# Patient Record
Sex: Male | Born: 1945 | Race: White | Hispanic: No | Marital: Married | Smoking: Never smoker
Health system: Southern US, Community
[De-identification: ages and names within clinical notes are randomized; demographics above are authoritative.]

## PROBLEM LIST (undated history)

## (undated) DIAGNOSIS — H9192 Unspecified hearing loss, left ear: Secondary | ICD-10-CM

## (undated) DIAGNOSIS — I50812 Chronic right heart failure: Secondary | ICD-10-CM

## (undated) DIAGNOSIS — I341 Nonrheumatic mitral (valve) prolapse: Secondary | ICD-10-CM

## (undated) DIAGNOSIS — C61 Malignant neoplasm of prostate: Secondary | ICD-10-CM

## (undated) DIAGNOSIS — E039 Hypothyroidism, unspecified: Secondary | ICD-10-CM

## (undated) DIAGNOSIS — R7302 Impaired glucose tolerance (oral): Secondary | ICD-10-CM

## (undated) DIAGNOSIS — I351 Nonrheumatic aortic (valve) insufficiency: Secondary | ICD-10-CM

## (undated) DIAGNOSIS — E785 Hyperlipidemia, unspecified: Secondary | ICD-10-CM

## (undated) DIAGNOSIS — G4730 Sleep apnea, unspecified: Secondary | ICD-10-CM

## (undated) DIAGNOSIS — C439 Malignant melanoma of skin, unspecified: Secondary | ICD-10-CM

## (undated) HISTORY — PX: CATARACT EXTRACTION: SUR2

## (undated) HISTORY — PX: COLONOSCOPY: SHX174

## (undated) HISTORY — PX: TONSILLECTOMY: SUR1361

## (undated) HISTORY — PX: MELANOMA EXCISION: SHX5266

## (undated) HISTORY — PX: RETINAL DETACHMENT SURGERY: SHX105

## (undated) HISTORY — DX: Unspecified hearing loss, left ear: H91.92

## (undated) HISTORY — PX: SPINE SURGERY: SHX786

## (undated) HISTORY — DX: Impaired glucose tolerance (oral): R73.02

## (undated) HISTORY — DX: Chronic right heart failure: I50.812

## (undated) HISTORY — DX: Nonrheumatic aortic (valve) insufficiency: I35.1

---

## 1968-07-13 HISTORY — PX: MENISCUS REPAIR: SHX5179

## 1997-10-26 ENCOUNTER — Emergency Department (HOSPITAL_COMMUNITY): Admission: EM | Admit: 1997-10-26 | Discharge: 1997-10-26 | Payer: Self-pay | Admitting: Emergency Medicine

## 1999-10-08 ENCOUNTER — Encounter: Admission: RE | Admit: 1999-10-08 | Discharge: 1999-10-08 | Payer: Self-pay | Admitting: *Deleted

## 1999-10-08 ENCOUNTER — Encounter: Payer: Self-pay | Admitting: *Deleted

## 2003-08-16 ENCOUNTER — Ambulatory Visit (HOSPITAL_COMMUNITY): Admission: RE | Admit: 2003-08-16 | Discharge: 2003-08-16 | Payer: Self-pay | Admitting: Gastroenterology

## 2007-03-30 ENCOUNTER — Encounter (INDEPENDENT_AMBULATORY_CARE_PROVIDER_SITE_OTHER): Payer: Self-pay | Admitting: Otolaryngology

## 2007-03-30 ENCOUNTER — Ambulatory Visit (HOSPITAL_COMMUNITY): Admission: RE | Admit: 2007-03-30 | Discharge: 2007-03-31 | Payer: Self-pay | Admitting: Otolaryngology

## 2010-11-25 NOTE — H&P (Signed)
Matthew Robinson, Matthew Robinson                ACCOUNT NO.:  [id number]   MEDICAL RECORD NO.:  [id number]          PATIENT TYPE:  AMB   LOCATION:  SDS                          FACILITY:  [facility]   PHYSICIAN:  Hermelinda Medicus, M.D.   DATE OF BIRTH:  Oct 28, 1945   DATE OF ADMISSION:  03/30/2007  DATE OF DISCHARGE:                              HISTORY & PHYSICAL   HISTORY OF PRESENT ILLNESS:  This patient is a 65 year old male who has  had considerable nasal congestion and snoring issues.  He has  considerable septal deviation, turbinate hypertrophy, and also has a  very redundant palate and very large uvula, typical of a snorer.  He now  enters for a septal reconstruction, turbinate reduction, as well as a  laser-assisted uvulopalatoplasty under local MAC anesthesia and will be  kept on 23-hour observation.   PAST MEDICAL HISTORY:  His past history is one of dyslipidemia.  He is  on Zocor.  He also has a chest x-ray with mild COPD changes.  He has  never been a smoker.  He has had some orthopedic issues under Dr.  Isaias Cowman.  His medications are the aspirin 81, which he just takes 3  times a week, multivitamins, and Zocor 20 mg q.o.d.  He does have some  mild allergic rhinitis along with this and sinus congestion, but  primarily this is a septal deviation.  He has early cataracts, mitral  prolapse, diverticulosis, a hemorrhoid, osteoarthritis left knee, lumbar  spine scoliosis.  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   FAMILY HISTORY:  Mother 66, still alive.  History of chronic atrial  fibrillation and breast cancer in the mom.  Father had a MI in 70s.  Also had non-Hodgkin's lymphoma.   SOCIAL HISTORY:  He does not smoke.  He does not drink.  He does  exercise, drinks some coffee.   PHYSICAL EXAMINATION:  GENERAL:  He is a well-nourished, well-developed  male.  VITAL SIGNS:  He weighs 270 and is 6'6 tall.  Blood pressure 135/65, at  this time it was 135/80.  When he was brought into the hospital, he  states he was somewhat anxious.  HEENT:  Ears are clear.  Tympanic membranes are clear and move well.  External ear canals are clear.  The nose shows a septal deviation along  the floor of the nose off the premaxillary crest on the left and also  the ethmoid and vomerine septal deviation going off again to the left.  The turbinates are really quite enlarged and blockage of his nose on the  right and the left.  His oral cavity is moderate in size.  He has a  fairly high resting tongue.  The uvula is 2-3 times larger than usual  and the palate is redundant and lower than usual.  The larynx is clear.  True cords, false cords, epiglottis, base of tongue are clear of  ulceration or mass.  True cord by bloody gag reflex, tunnels by the EOMs  and facial nerve are  all symmetrical.  NECK:  Free of any thyromegaly, cervical adenopathy, or mass, but it is  fairly enlarged and full.  CHEST:  Clear.  No rales, rhonchi or wheezes.  CARDIOVASCULAR:  Normal S1.  No murmurs, rubs or gallops.  History of  mitral valve prolapse.  EKG done here showed sinus bradycardia,  otherwise within normal limits.  EXTREMITIES:  Unremarkable.  Left knee history of surgery.   INITIAL DIAGNOSES:  1. 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  2. History of dyslipidemia.  3. History of mild allergies.  4. Early cataract.  5. Mitral valve prolapse.  6. Diverticulosis.  7. Osteoarthritis.  8. History of retinal repair left eye and post knee surgery for      cartilage damage left.           ______________________________  Hermelinda Medicus, M.D.     JC/MEDQ  D:  03/30/2007  T:  03/31/2007  Job:  [id number]   cc:   Georgann Housekeeper, MD

## 2010-11-25 NOTE — Op Note (Signed)
Matthew Robinson, Matthew Robinson                ACCOUNT NO.:  [id number]   MEDICAL RECORD NO.:  [id number]          PATIENT TYPE:  OIB   LOCATION:  2550                         FACILITY:  [facility]   PHYSICIAN:  Hermelinda Medicus, M.D.   DATE OF BIRTH:  1946/03/24   DATE OF PROCEDURE:  DATE OF DISCHARGE:                               OPERATIVE REPORT   PREOPERATIVE DIAGNOSIS:  Septal deviation turbinate hypertrophy with  nasal obstruction, with snoring, with redundant palate and an increased  size uvula.   POSTOPERATIVE DIAGNOSIS:  Septal deviation turbinate hypertrophy with  nasal obstruction, with snoring, with redundant palate and an increased  size uvula.   OPERATION:  Septal reconstruction, turbinate reduction and a laser  assisted uvulopalatoplasty.   OPERATOR:  Hermelinda Medicus, M.D.   ANESTHESIA:  Local monitored anesthesia care with Dr. Katrinka Blazing.   PROCEDURE:  The patient was placed in the supine position.  Under local  MAC anesthesia, was anesthetized using 1% Xylocaine with epinephrine and  topical cocaine 200 mg.  Once the nose was anesthetized, the  hemitransfixion incision was made in the left side, carried back along  the floor of the nose along a very large premaxillary crest area and  also along the ethmoid and vomerine region, which was also deviated  considerably to the left.  A strip of quadrilateral cartilage was taken  down in the posterior aspect and then the strip of cartilage that was  taken from the floor of the nose was grossly into the left side of the  nose.  A 4-mm chisel was also used to take down this portion of septum  that was over off the premaxillary space.  The ethmoid septum was then  approached using the open and closed Jansen-Middletons as well as the  vomerine and then we were able to push some of the septum back to the  midline using the butter knife.  Once this was established in the  midline, then closure begun using 5-0 plane catgut and through and  through  septal suture using 4-0 plain x2 as a hemostatic and as a  blanket stitch.  Attention was then carried to the inferior turbinates,  especially where we were able to outfracture them aggressively and gain  considerable space.  We also used the Elmed bipolar cautery set at 12 to  cauterize the inferolateral aspects of the inferior turbinates.  Attention then was carried to the oral cavity where the eyes and face  were covered with glasses and wet towels.  Oxygen was turned off and the  laser was used after anesthetizing with Cetacaine spray and 1% Xylocaine  with epinephrine.  The laser was used set at 10 watts for 3 minutes.  This was done.  The laser cuts were placed just lateral and superior to  the uvula, and the uvula was approximately 2 to 3 times larger than its  normal size and, therefore, was trimmed also using the laser.  Once this  was completed, the hemostasis was no problem.  Left Merocel pack was  placed and an anesthesia trumpet 7.5 was placed in the right  side.  Patient was relieved of his medication and was taken to the recovery  room and doing very well.   Followup will be in overnight pulse oximetry observation and then in 5  days, 10 day, 3 weeks, 6 weeks, 3 months and 6 months.  He is aware of  the risks and gains, that he is not to be eating aggressively for the  next 10 days, to  avoid highly flavored foods like salad dressings, vinegar, anything  containing vinegar or acidic like grapefruit juice, orange juice,  anything hard to chew, hamburgers, this type of food.  He is also to be  limiting his travels and his physical exercise for the next 10 days.           ______________________________  Hermelinda Medicus, M.D.     JC/MEDQ  D:  03/30/2007  T:  03/30/2007  Job:  [id number]   cc:   Georgann Housekeeper, MD

## 2010-11-28 NOTE — Op Note (Signed)
NAMEJONTE, [name]                            ACCOUNT NO.:  [id number]   MEDICAL RECORD NO.:  [id number]                   PATIENT TYPE:  AMB   LOCATION:  ENDO                                 FACILITY:  [facility]   PHYSICIAN:  Danise Edge, M.D.                DATE OF BIRTH:  1946/03/02   DATE OF PROCEDURE:  08/16/2003  DATE OF DISCHARGE:                                 OPERATIVE REPORT   PROCEDURE:  Screening colonoscopy.   INDICATIONS FOR PROCEDURE:  [name] is a 64 year old male born  01/26/1946.  [name] is scheduled to undergo his first screening  colonoscopy with polypectomy to prevent colon cancer.   ENDOSCOPIST:  Danise Edge, M.D.   PREMEDICATION:  Versed 10 mg, Demerol 70 mg.   DESCRIPTION OF PROCEDURE:  After obtaining informed consent, [name] was  placed in the left lateral decubitus position. I administered intravenous  Demerol and intravenous Versed to achieve conscious sedation for the  procedure. The patient's blood pressure, oxygen saturation and cardiac  rhythm were monitored throughout the procedure and documented in the medical  record.   Anal inspection was normal. Digital rectal exam revealed an enlarged  prostate. The Olympus adjustable pediatric colonoscope was introduced into  the rectum and advanced to the cecum. Colonic preparation for the exam today  was excellent.   [name] has scattered small diverticula throughout the colon without  signs of diverticulitis.   RECTUM:  Normal.   SIGMOID COLON AND DESCENDING COLON:  Normal.   SPLENIC FLEXURE:  Normal.   TRANSVERSE COLON:  Normal.   HEPATIC FLEXURE:  Normal.   ASCENDING COLON:  Normal.   CECUM AND ILEOCECAL VALVE:  Normal.   ASSESSMENT:  Normal screening proctocolonoscopy to the cecum.  No endoscopic  evidence for the presence of colorectal neoplasia.                                               Danise Edge, M.D.    MJ/MEDQ  D:  08/16/2003  T:   08/16/2003  Job:  [id number]   cc:   Georgann Housekeeper, M.D.  [address]  [place]  [place] [address]  Fax: [phone]

## 2011-04-23 LAB — BASIC METABOLIC PANEL
BUN: 16
CO2: 25
Calcium: 9.9
Chloride: 105
Creatinine, Ser: 1.06
GFR calc Af Amer: >60
GFR calc non Af Amer: >60
Glucose, Bld: 91
Potassium: 4
Sodium: 138

## 2011-04-23 LAB — URINALYSIS, ROUTINE W REFLEX MICROSCOPIC
Bilirubin Urine: NEGATIVE
Glucose, UA: NEGATIVE
Hgb urine dipstick: NEGATIVE
Ketones, ur: 15 — AB
Nitrite: NEGATIVE
Protein, ur: NEGATIVE
Specific Gravity, Urine: 1.025
Urobilinogen, UA: 0.2
pH: 5

## 2011-04-23 LAB — CBC
HCT: 49.5
Hemoglobin: 16.8
MCHC: 34
MCV: 92.7
Platelets: 183
RBC: 5.33
RDW: 13.6
WBC: 6.6

## 2011-10-14 DIAGNOSIS — Z1331 Encounter for screening for depression: Secondary | ICD-10-CM | POA: Diagnosis not present

## 2011-10-14 DIAGNOSIS — R7309 Other abnormal glucose: Secondary | ICD-10-CM | POA: Diagnosis not present

## 2011-10-14 DIAGNOSIS — E782 Mixed hyperlipidemia: Secondary | ICD-10-CM | POA: Diagnosis not present

## 2011-10-14 DIAGNOSIS — Z125 Encounter for screening for malignant neoplasm of prostate: Secondary | ICD-10-CM | POA: Diagnosis not present

## 2011-10-14 DIAGNOSIS — G4733 Obstructive sleep apnea (adult) (pediatric): Secondary | ICD-10-CM | POA: Diagnosis not present

## 2011-10-14 DIAGNOSIS — Z0000 Encounter for general adult medical examination without abnormal findings: Secondary | ICD-10-CM | POA: Diagnosis not present

## 2011-10-14 DIAGNOSIS — K219 Gastro-esophageal reflux disease without esophagitis: Secondary | ICD-10-CM | POA: Diagnosis not present

## 2011-10-19 DIAGNOSIS — R7309 Other abnormal glucose: Secondary | ICD-10-CM | POA: Diagnosis not present

## 2011-10-19 DIAGNOSIS — M47817 Spondylosis without myelopathy or radiculopathy, lumbosacral region: Secondary | ICD-10-CM | POA: Diagnosis not present

## 2011-10-19 DIAGNOSIS — K219 Gastro-esophageal reflux disease without esophagitis: Secondary | ICD-10-CM | POA: Diagnosis not present

## 2011-10-19 DIAGNOSIS — E782 Mixed hyperlipidemia: Secondary | ICD-10-CM | POA: Diagnosis not present

## 2011-10-19 DIAGNOSIS — G4733 Obstructive sleep apnea (adult) (pediatric): Secondary | ICD-10-CM | POA: Diagnosis not present

## 2011-12-09 DIAGNOSIS — H2510 Age-related nuclear cataract, unspecified eye: Secondary | ICD-10-CM | POA: Diagnosis not present

## 2011-12-10 ENCOUNTER — Other Ambulatory Visit: Payer: Self-pay | Admitting: Dermatology

## 2011-12-10 DIAGNOSIS — L851 Acquired keratosis [keratoderma] palmaris et plantaris: Secondary | ICD-10-CM | POA: Diagnosis not present

## 2011-12-10 DIAGNOSIS — L820 Inflamed seborrheic keratosis: Secondary | ICD-10-CM | POA: Diagnosis not present

## 2011-12-10 DIAGNOSIS — D235 Other benign neoplasm of skin of trunk: Secondary | ICD-10-CM | POA: Diagnosis not present

## 2011-12-10 DIAGNOSIS — B079 Viral wart, unspecified: Secondary | ICD-10-CM | POA: Diagnosis not present

## 2011-12-10 DIAGNOSIS — L819 Disorder of pigmentation, unspecified: Secondary | ICD-10-CM | POA: Diagnosis not present

## 2011-12-10 DIAGNOSIS — C4359 Malignant melanoma of other part of trunk: Secondary | ICD-10-CM | POA: Diagnosis not present

## 2011-12-10 DIAGNOSIS — C4360 Malignant melanoma of unspecified upper limb, including shoulder: Secondary | ICD-10-CM | POA: Diagnosis not present

## 2012-01-06 DIAGNOSIS — D485 Neoplasm of uncertain behavior of skin: Secondary | ICD-10-CM | POA: Diagnosis not present

## 2012-01-06 DIAGNOSIS — C4359 Malignant melanoma of other part of trunk: Secondary | ICD-10-CM | POA: Diagnosis not present

## 2012-01-18 DIAGNOSIS — C4499 Other specified malignant neoplasm of skin, unspecified: Secondary | ICD-10-CM | POA: Diagnosis not present

## 2012-01-18 DIAGNOSIS — C4359 Malignant melanoma of other part of trunk: Secondary | ICD-10-CM | POA: Diagnosis not present

## 2012-01-18 DIAGNOSIS — C4360 Malignant melanoma of unspecified upper limb, including shoulder: Secondary | ICD-10-CM | POA: Diagnosis not present

## 2012-01-20 DIAGNOSIS — R238 Other skin changes: Secondary | ICD-10-CM | POA: Diagnosis not present

## 2012-04-19 DIAGNOSIS — Z85820 Personal history of malignant melanoma of skin: Secondary | ICD-10-CM | POA: Diagnosis not present

## 2012-05-07 DIAGNOSIS — Z23 Encounter for immunization: Secondary | ICD-10-CM | POA: Diagnosis not present

## 2012-05-16 DIAGNOSIS — B079 Viral wart, unspecified: Secondary | ICD-10-CM | POA: Diagnosis not present

## 2012-05-16 DIAGNOSIS — I8310 Varicose veins of unspecified lower extremity with inflammation: Secondary | ICD-10-CM | POA: Diagnosis not present

## 2012-06-14 DIAGNOSIS — B079 Viral wart, unspecified: Secondary | ICD-10-CM | POA: Diagnosis not present

## 2012-07-19 DIAGNOSIS — L608 Other nail disorders: Secondary | ICD-10-CM | POA: Diagnosis not present

## 2012-07-19 DIAGNOSIS — B079 Viral wart, unspecified: Secondary | ICD-10-CM | POA: Diagnosis not present

## 2012-08-17 DIAGNOSIS — B079 Viral wart, unspecified: Secondary | ICD-10-CM | POA: Diagnosis not present

## 2012-08-17 DIAGNOSIS — B353 Tinea pedis: Secondary | ICD-10-CM | POA: Diagnosis not present

## 2012-09-14 DIAGNOSIS — B079 Viral wart, unspecified: Secondary | ICD-10-CM | POA: Diagnosis not present

## 2012-09-22 DIAGNOSIS — M25559 Pain in unspecified hip: Secondary | ICD-10-CM | POA: Diagnosis not present

## 2012-09-22 DIAGNOSIS — M48061 Spinal stenosis, lumbar region without neurogenic claudication: Secondary | ICD-10-CM | POA: Diagnosis not present

## 2012-09-26 DIAGNOSIS — M25559 Pain in unspecified hip: Secondary | ICD-10-CM | POA: Diagnosis not present

## 2012-09-26 DIAGNOSIS — M48061 Spinal stenosis, lumbar region without neurogenic claudication: Secondary | ICD-10-CM | POA: Diagnosis not present

## 2012-09-27 DIAGNOSIS — M48061 Spinal stenosis, lumbar region without neurogenic claudication: Secondary | ICD-10-CM | POA: Diagnosis not present

## 2012-09-28 DIAGNOSIS — M47817 Spondylosis without myelopathy or radiculopathy, lumbosacral region: Secondary | ICD-10-CM | POA: Diagnosis not present

## 2012-10-06 DIAGNOSIS — M48061 Spinal stenosis, lumbar region without neurogenic claudication: Secondary | ICD-10-CM | POA: Diagnosis not present

## 2012-10-10 DIAGNOSIS — D235 Other benign neoplasm of skin of trunk: Secondary | ICD-10-CM | POA: Diagnosis not present

## 2012-10-10 DIAGNOSIS — B079 Viral wart, unspecified: Secondary | ICD-10-CM | POA: Diagnosis not present

## 2012-10-10 DIAGNOSIS — Z85820 Personal history of malignant melanoma of skin: Secondary | ICD-10-CM | POA: Diagnosis not present

## 2012-10-11 DIAGNOSIS — M48061 Spinal stenosis, lumbar region without neurogenic claudication: Secondary | ICD-10-CM | POA: Diagnosis not present

## 2012-10-11 DIAGNOSIS — M545 Low back pain, unspecified: Secondary | ICD-10-CM | POA: Diagnosis not present

## 2012-10-12 ENCOUNTER — Other Ambulatory Visit: Payer: Self-pay | Admitting: Neurosurgery

## 2012-10-13 ENCOUNTER — Encounter (HOSPITAL_COMMUNITY): Payer: Self-pay | Admitting: Pharmacy Technician

## 2012-10-17 ENCOUNTER — Encounter (HOSPITAL_COMMUNITY)
Admission: RE | Admit: 2012-10-17 | Discharge: 2012-10-17 | Disposition: A | Discharge status: Home / Self Care | Payer: Medicare Other | Source: Ambulatory Visit | Attending: Neurosurgery | Admitting: Neurosurgery

## 2012-10-17 ENCOUNTER — Encounter (HOSPITAL_COMMUNITY): Payer: Self-pay

## 2012-10-17 DIAGNOSIS — M48061 Spinal stenosis, lumbar region without neurogenic claudication: Secondary | ICD-10-CM | POA: Diagnosis not present

## 2012-10-17 DIAGNOSIS — M5137 Other intervertebral disc degeneration, lumbosacral region: Secondary | ICD-10-CM | POA: Diagnosis not present

## 2012-10-17 DIAGNOSIS — M519 Unspecified thoracic, thoracolumbar and lumbosacral intervertebral disc disorder: Secondary | ICD-10-CM | POA: Diagnosis not present

## 2012-10-17 DIAGNOSIS — Z79899 Other long term (current) drug therapy: Secondary | ICD-10-CM | POA: Diagnosis not present

## 2012-10-17 DIAGNOSIS — M51379 Other intervertebral disc degeneration, lumbosacral region without mention of lumbar back pain or lower extremity pain: Secondary | ICD-10-CM | POA: Diagnosis present

## 2012-10-17 DIAGNOSIS — M4310 Spondylolisthesis, site unspecified: Secondary | ICD-10-CM | POA: Diagnosis not present

## 2012-10-17 DIAGNOSIS — M7130 Other bursal cyst, unspecified site: Secondary | ICD-10-CM | POA: Diagnosis present

## 2012-10-17 DIAGNOSIS — E785 Hyperlipidemia, unspecified: Secondary | ICD-10-CM | POA: Diagnosis present

## 2012-10-17 DIAGNOSIS — M539 Dorsopathy, unspecified: Secondary | ICD-10-CM | POA: Diagnosis not present

## 2012-10-17 DIAGNOSIS — M47817 Spondylosis without myelopathy or radiculopathy, lumbosacral region: Secondary | ICD-10-CM | POA: Diagnosis not present

## 2012-10-17 DIAGNOSIS — Z7982 Long term (current) use of aspirin: Secondary | ICD-10-CM | POA: Diagnosis not present

## 2012-10-17 DIAGNOSIS — M5126 Other intervertebral disc displacement, lumbar region: Secondary | ICD-10-CM | POA: Diagnosis not present

## 2012-10-17 DIAGNOSIS — Z01812 Encounter for preprocedural laboratory examination: Secondary | ICD-10-CM | POA: Diagnosis not present

## 2012-10-17 DIAGNOSIS — Z981 Arthrodesis status: Secondary | ICD-10-CM | POA: Diagnosis not present

## 2012-10-17 DIAGNOSIS — G4730 Sleep apnea, unspecified: Secondary | ICD-10-CM | POA: Diagnosis present

## 2012-10-17 HISTORY — DX: Nonrheumatic mitral (valve) prolapse: I34.1

## 2012-10-17 HISTORY — DX: Malignant melanoma of skin, unspecified: C43.9

## 2012-10-17 HISTORY — DX: Hyperlipidemia, unspecified: E78.5

## 2012-10-17 LAB — BASIC METABOLIC PANEL
BUN: 14 mg/dL (ref 6–23)
CO2: 27 mEq/L (ref 19–32)
Chloride: 104 mEq/L (ref 96–112)
Creatinine, Ser: 0.78 mg/dL (ref 0.50–1.35)
GFR calc Af Amer: >90 mL/min (ref >90)
Potassium: 4 mEq/L (ref 3.5–5.1)

## 2012-10-17 LAB — SURGICAL PCR SCREEN
MRSA, PCR: NEGATIVE
Staphylococcus aureus: NEGATIVE

## 2012-10-17 LAB — TYPE AND SCREEN: Antibody Screen: NEGATIVE

## 2012-10-17 LAB — CBC
HCT: 46.3 % (ref 39.0–52.0)
Hemoglobin: 16 g/dL (ref 13.0–17.0)
MCHC: 34.6 g/dL (ref 30.0–36.0)
MCV: 91.5 fL (ref 78.0–100.0)
RDW: 13.8 % (ref 11.5–15.5)

## 2012-10-17 LAB — ABO/RH: ABO/RH(D): O POS

## 2012-10-17 NOTE — Progress Notes (Signed)
Pt to call office with questions concerning procedure.  Will sign consent DOS.

## 2012-10-17 NOTE — Pre-Procedure Instructions (Addendum)
Matthew Robinson  10/17/2012   Your procedure is scheduled on:  October 19, 2012  Report to [facility] at 9:50 AM.  Call this number if you have problems the morning of surgery: [phone]   Remember:    Discontinue aspirin, fish oil, and vitamin D 7 days prior to surgery   Do not eat food or drink liquids after midnight.   Take these medicines the morning of surgery with A SIP OF WATER: none   Do not wear jewelry, make-up or nail polish.  Do not wear lotions, powders, or perfumes. You may wear deodorant.  Do not shave 48 hours prior to surgery. Men may shave face and neck.  Do not bring valuables to the hospital.  Contacts, dentures or bridgework may not be worn into surgery.  Leave suitcase in the car. After surgery it may be brought to your room.  For patients admitted to the hospital, checkout time is 11:00 AM the day of  discharge.   Patients discharged the day of surgery will not be allowed to drive  home.  Name and phone number of your driver: family/friend  Special Instructions: Shower using CHG 2 nights before surgery and the night before surgery.  If you shower the day of surgery use CHG.  Use special wash - you have one bottle of CHG for all showers.  You should use approximately 1/3 of the bottle for each shower.   Please read over the following fact sheets that you were given: Pain Booklet, Coughing and Deep Breathing, Blood Transfusion Information and Surgical Site Infection Prevention

## 2012-10-18 MED ORDER — DEXTROSE 5 % IV SOLN
3.0000 g | INTRAVENOUS | Status: AC
  Administered 2012-10-19 (×2): 3 g via INTRAVENOUS
  Filled 2012-10-18: qty 3000

## 2012-10-19 ENCOUNTER — Inpatient Hospital Stay (HOSPITAL_COMMUNITY)
Admission: RE | Admit: 2012-10-19 | Discharge: 2012-10-21 | DRG: 460 | Disposition: A | Discharge status: Home / Self Care | Payer: Medicare Other | Source: Ambulatory Visit | Attending: Neurosurgery | Admitting: Neurosurgery

## 2012-10-19 ENCOUNTER — Encounter (HOSPITAL_COMMUNITY)
Admission: RE | Disposition: A | Discharge status: Home / Self Care | Payer: Self-pay | Source: Ambulatory Visit | Attending: Neurosurgery

## 2012-10-19 ENCOUNTER — Inpatient Hospital Stay (HOSPITAL_COMMUNITY): Payer: Medicare Other | Admitting: Anesthesiology

## 2012-10-19 ENCOUNTER — Encounter (HOSPITAL_COMMUNITY): Payer: Self-pay | Admitting: Anesthesiology

## 2012-10-19 ENCOUNTER — Inpatient Hospital Stay (HOSPITAL_COMMUNITY): Payer: Medicare Other

## 2012-10-19 ENCOUNTER — Encounter (HOSPITAL_COMMUNITY): Payer: Self-pay | Admitting: *Deleted

## 2012-10-19 DIAGNOSIS — G4730 Sleep apnea, unspecified: Secondary | ICD-10-CM | POA: Diagnosis present

## 2012-10-19 DIAGNOSIS — Z7982 Long term (current) use of aspirin: Secondary | ICD-10-CM

## 2012-10-19 DIAGNOSIS — M47817 Spondylosis without myelopathy or radiculopathy, lumbosacral region: Secondary | ICD-10-CM | POA: Diagnosis present

## 2012-10-19 DIAGNOSIS — Z01812 Encounter for preprocedural laboratory examination: Secondary | ICD-10-CM

## 2012-10-19 DIAGNOSIS — M4310 Spondylolisthesis, site unspecified: Secondary | ICD-10-CM | POA: Diagnosis present

## 2012-10-19 DIAGNOSIS — E785 Hyperlipidemia, unspecified: Secondary | ICD-10-CM | POA: Diagnosis present

## 2012-10-19 DIAGNOSIS — Z79899 Other long term (current) drug therapy: Secondary | ICD-10-CM

## 2012-10-19 DIAGNOSIS — M5126 Other intervertebral disc displacement, lumbar region: Principal | ICD-10-CM | POA: Diagnosis present

## 2012-10-19 DIAGNOSIS — M7130 Other bursal cyst, unspecified site: Secondary | ICD-10-CM | POA: Diagnosis present

## 2012-10-19 DIAGNOSIS — M51379 Other intervertebral disc degeneration, lumbosacral region without mention of lumbar back pain or lower extremity pain: Secondary | ICD-10-CM | POA: Diagnosis present

## 2012-10-19 DIAGNOSIS — M5137 Other intervertebral disc degeneration, lumbosacral region: Secondary | ICD-10-CM | POA: Diagnosis present

## 2012-10-19 HISTORY — DX: Sleep apnea, unspecified: G47.30

## 2012-10-19 SURGERY — POSTERIOR LUMBAR FUSION 2 LEVEL
Anesthesia: General | Site: Back | Wound class: Clean

## 2012-10-19 MED ORDER — ACETAMINOPHEN 10 MG/ML IV SOLN
INTRAVENOUS | Status: AC
  Administered 2012-10-19: 1000 mg via INTRAVENOUS
  Filled 2012-10-19: qty 100

## 2012-10-19 MED ORDER — OXYCODONE HCL 5 MG PO TABS: 5.0000 mg | ORAL_TABLET | Freq: Once | ORAL | Status: DC | PRN

## 2012-10-19 MED ORDER — SODIUM CHLORIDE 0.9 % IJ SOLN
3.0000 mL | Freq: Two times a day (BID) | INTRAMUSCULAR | Status: DC
  Administered 2012-10-20 (×2): 3 mL via INTRAVENOUS

## 2012-10-19 MED ORDER — SODIUM CHLORIDE 0.9 % IR SOLN
Status: DC | PRN
  Administered 2012-10-19: 13:00:00

## 2012-10-19 MED ORDER — FENTANYL CITRATE 0.05 MG/ML IJ SOLN
INTRAMUSCULAR | Status: DC | PRN
  Administered 2012-10-19: 50 ug via INTRAVENOUS
  Administered 2012-10-19 (×2): 25 ug via INTRAVENOUS
  Administered 2012-10-19: 100 ug via INTRAVENOUS
  Administered 2012-10-19: 50 ug via INTRAVENOUS

## 2012-10-19 MED ORDER — HYDROXYZINE HCL 25 MG PO TABS: 50.0000 mg | ORAL_TABLET | ORAL | Status: DC | PRN

## 2012-10-19 MED ORDER — SODIUM CHLORIDE 0.9 % IJ SOLN: 3.0000 mL | INTRAMUSCULAR | Status: DC | PRN

## 2012-10-19 MED ORDER — VECURONIUM BROMIDE 10 MG IV SOLR
INTRAVENOUS | Status: DC | PRN
  Administered 2012-10-19 (×6): 2 mg via INTRAVENOUS
  Administered 2012-10-19: 3 mg via INTRAVENOUS
  Administered 2012-10-19: 2 mg via INTRAVENOUS
  Administered 2012-10-19: 3 mg via INTRAVENOUS

## 2012-10-19 MED ORDER — THROMBIN 20000 UNITS EX SOLR
OROMUCOSAL | Status: DC | PRN
  Administered 2012-10-19: 13:00:00 via TOPICAL

## 2012-10-19 MED ORDER — SODIUM CHLORIDE 0.9 % IR SOLN
Status: DC | PRN
  Administered 2012-10-19 (×4): 1000 mL

## 2012-10-19 MED ORDER — SODIUM CHLORIDE 0.9 % IV SOLN: 250.0000 mL | INTRAVENOUS | Status: DC

## 2012-10-19 MED ORDER — ZOLPIDEM TARTRATE 5 MG PO TABS: 5.0000 mg | ORAL_TABLET | Freq: Every evening | ORAL | Status: DC | PRN

## 2012-10-19 MED ORDER — LIDOCAINE HCL (CARDIAC) 20 MG/ML IV SOLN
INTRAVENOUS | Status: DC | PRN
  Administered 2012-10-19: 100 mg via INTRAVENOUS

## 2012-10-19 MED ORDER — KETOROLAC TROMETHAMINE 30 MG/ML IJ SOLN
30.0000 mg | Freq: Four times a day (QID) | INTRAMUSCULAR | Status: DC
  Administered 2012-10-19 – 2012-10-21 (×6): 30 mg via INTRAVENOUS
  Filled 2012-10-19 (×9): qty 1

## 2012-10-19 MED ORDER — ONDANSETRON 8 MG/NS 50 ML IVPB
8.0000 mg | Freq: Four times a day (QID) | INTRAVENOUS | Status: DC | PRN
  Filled 2012-10-19: qty 8

## 2012-10-19 MED ORDER — LIDOCAINE-EPINEPHRINE 1 %-1:100000 IJ SOLN
INTRAMUSCULAR | Status: DC | PRN
  Administered 2012-10-19: 15 mL via INTRADERMAL

## 2012-10-19 MED ORDER — ACETAMINOPHEN 650 MG RE SUPP: 650.0000 mg | RECTAL | Status: DC | PRN

## 2012-10-19 MED ORDER — ARTIFICIAL TEARS OP OINT
TOPICAL_OINTMENT | OPHTHALMIC | Status: DC | PRN
  Administered 2012-10-19: 1 via OPHTHALMIC

## 2012-10-19 MED ORDER — NEOSTIGMINE METHYLSULFATE 1 MG/ML IJ SOLN
INTRAMUSCULAR | Status: DC | PRN
  Administered 2012-10-19: 3 mg via INTRAVENOUS

## 2012-10-19 MED ORDER — BISACODYL 10 MG RE SUPP: 10.0000 mg | Freq: Every day | RECTAL | Status: DC | PRN

## 2012-10-19 MED ORDER — MENTHOL 3 MG MT LOZG
1.0000 | LOZENGE | OROMUCOSAL | Status: DC | PRN
  Administered 2012-10-19: 3 mg via ORAL
  Filled 2012-10-19: qty 9

## 2012-10-19 MED ORDER — LACTATED RINGERS IV SOLN
INTRAVENOUS | Status: DC | PRN
  Administered 2012-10-19 (×3): via INTRAVENOUS

## 2012-10-19 MED ORDER — PROPOFOL 10 MG/ML IV BOLUS
INTRAVENOUS | Status: DC | PRN
  Administered 2012-10-19: 200 mg via INTRAVENOUS

## 2012-10-19 MED ORDER — PHENOL 1.4 % MT LIQD: 1.0000 | OROMUCOSAL | Status: DC | PRN

## 2012-10-19 MED ORDER — KETOROLAC TROMETHAMINE 30 MG/ML IJ SOLN
30.0000 mg | Freq: Once | INTRAMUSCULAR | Status: AC
  Administered 2012-10-19: 30 mg via INTRAVENOUS

## 2012-10-19 MED ORDER — THROMBIN 5000 UNITS EX SOLR
OROMUCOSAL | Status: DC | PRN
  Administered 2012-10-19: 18:00:00 via TOPICAL

## 2012-10-19 MED ORDER — CEFAZOLIN SODIUM-DEXTROSE 2-3 GM-% IV SOLR
INTRAVENOUS | Status: AC
  Filled 2012-10-19: qty 50

## 2012-10-19 MED ORDER — ALUM & MAG HYDROXIDE-SIMETH 200-200-20 MG/5ML PO SUSP
30.0000 mL | Freq: Four times a day (QID) | ORAL | Status: DC | PRN
  Filled 2012-10-19 (×3): qty 30

## 2012-10-19 MED ORDER — SODIUM CHLORIDE 0.9 % IV SOLN
INTRAVENOUS | Status: DC | PRN
  Administered 2012-10-19: 18:00:00 via INTRAVENOUS

## 2012-10-19 MED ORDER — THROMBIN 20000 UNITS EX KIT
PACK | CUTANEOUS | Status: DC | PRN
  Administered 2012-10-19: 20000 [IU] via TOPICAL

## 2012-10-19 MED ORDER — KETOROLAC TROMETHAMINE 30 MG/ML IJ SOLN
INTRAMUSCULAR | Status: AC
  Filled 2012-10-19: qty 1

## 2012-10-19 MED ORDER — ONDANSETRON HCL 4 MG/2ML IJ SOLN
INTRAMUSCULAR | Status: DC | PRN
  Administered 2012-10-19: 4 mg via INTRAVENOUS

## 2012-10-19 MED ORDER — CYCLOBENZAPRINE HCL 10 MG PO TABS: 10.0000 mg | ORAL_TABLET | Freq: Three times a day (TID) | ORAL | Status: DC | PRN

## 2012-10-19 MED ORDER — ACETAMINOPHEN 325 MG PO TABS: 650.0000 mg | ORAL_TABLET | ORAL | Status: DC | PRN

## 2012-10-19 MED ORDER — SODIUM CHLORIDE 0.9 % IV SOLN
INTRAVENOUS | Status: AC
  Filled 2012-10-19: qty 500

## 2012-10-19 MED ORDER — GLYCOPYRROLATE 0.2 MG/ML IJ SOLN
INTRAMUSCULAR | Status: DC | PRN
  Administered 2012-10-19: 0.4 mg via INTRAVENOUS
  Administered 2012-10-19 (×2): 0.2 mg via INTRAVENOUS

## 2012-10-19 MED ORDER — OXYCODONE HCL 5 MG/5ML PO SOLN: 5.0000 mg | Freq: Once | ORAL | Status: DC | PRN

## 2012-10-19 MED ORDER — KCL IN DEXTROSE-NACL 20-5-0.45 MEQ/L-%-% IV SOLN
INTRAVENOUS | Status: DC
  Administered 2012-10-19: 22:00:00 via INTRAVENOUS
  Filled 2012-10-19 (×6): qty 1000

## 2012-10-19 MED ORDER — ONDANSETRON HCL 4 MG/2ML IJ SOLN: 4.0000 mg | Freq: Four times a day (QID) | INTRAMUSCULAR | Status: DC | PRN

## 2012-10-19 MED ORDER — BACITRACIN 50000 UNITS IM SOLR
INTRAMUSCULAR | Status: AC
  Filled 2012-10-19: qty 1

## 2012-10-19 MED ORDER — OXYCODONE HCL 5 MG PO TABS
5.0000 mg | ORAL_TABLET | ORAL | Status: DC | PRN
  Administered 2012-10-21: 5 mg via ORAL
  Filled 2012-10-19: qty 1

## 2012-10-19 MED ORDER — BUPIVACAINE HCL (PF) 0.5 % IJ SOLN
INTRAMUSCULAR | Status: DC | PRN
  Administered 2012-10-19: 15 mL

## 2012-10-19 MED ORDER — METOCLOPRAMIDE HCL 5 MG/ML IJ SOLN: 10.0000 mg | Freq: Once | INTRAMUSCULAR | Status: DC | PRN

## 2012-10-19 MED ORDER — ACETAMINOPHEN 10 MG/ML IV SOLN
1000.0000 mg | Freq: Four times a day (QID) | INTRAVENOUS | Status: AC
  Administered 2012-10-19 – 2012-10-20 (×4): 1000 mg via INTRAVENOUS
  Filled 2012-10-19 (×5): qty 100

## 2012-10-19 MED ORDER — MORPHINE SULFATE 4 MG/ML IJ SOLN: 4.0000 mg | INTRAMUSCULAR | Status: DC | PRN

## 2012-10-19 MED ORDER — MIDAZOLAM HCL 5 MG/5ML IJ SOLN
INTRAMUSCULAR | Status: DC | PRN
  Administered 2012-10-19: 2 mg via INTRAVENOUS

## 2012-10-19 MED ORDER — MAGNESIUM HYDROXIDE 400 MG/5ML PO SUSP: 30.0000 mL | Freq: Every day | ORAL | Status: DC | PRN

## 2012-10-19 MED ORDER — ROCURONIUM BROMIDE 100 MG/10ML IV SOLN
INTRAVENOUS | Status: DC | PRN
  Administered 2012-10-19: 50 mg via INTRAVENOUS
  Administered 2012-10-19: 10 mg via INTRAVENOUS

## 2012-10-19 MED ORDER — HYDROMORPHONE HCL PF 1 MG/ML IJ SOLN: 0.2500 mg | INTRAMUSCULAR | Status: DC | PRN

## 2012-10-19 MED ORDER — CEFAZOLIN SODIUM 1-5 GM-% IV SOLN
INTRAVENOUS | Status: AC
  Filled 2012-10-19: qty 50

## 2012-10-19 MED ORDER — EPHEDRINE SULFATE 50 MG/ML IJ SOLN
INTRAMUSCULAR | Status: DC | PRN
  Administered 2012-10-19: 5 mg via INTRAVENOUS
  Administered 2012-10-19: 10 mg via INTRAVENOUS

## 2012-10-19 SURGICAL SUPPLY — 82 items
BAG DECANTER FOR FLEXI CONT (MISCELLANEOUS) ×2 IMPLANT
BENZOIN TINCTURE PRP APPL 2/3 (GAUZE/BANDAGES/DRESSINGS) IMPLANT
BLADE SURG ROTATE 9660 (MISCELLANEOUS) ×2 IMPLANT
BRUSH SCRUB EZ PLAIN DRY (MISCELLANEOUS) ×2 IMPLANT
BUR ACRON 5.0MM COATED (BURR) ×4 IMPLANT
BUR MATCHSTICK NEURO 3.0 LAGG (BURR) ×2 IMPLANT
CANISTER SUCTION 2500CC (MISCELLANEOUS) ×2 IMPLANT
CAP LCK SPNE (Orthopedic Implant) ×6 IMPLANT
CAP LOCK SPINE RADIUS (Orthopedic Implant) ×6 IMPLANT
CAP LOCKING (Orthopedic Implant) ×6 IMPLANT
CLOTH BEACON ORANGE TIMEOUT ST (SAFETY) ×2 IMPLANT
CONT SPEC 4OZ CLIKSEAL STRL BL (MISCELLANEOUS) ×4 IMPLANT
COVER BACK TABLE 24X17X13 BIG (DRAPES) IMPLANT
COVER TABLE BACK 60X90 (DRAPES) ×2 IMPLANT
CROSSLINK MEDIUM (Orthopedic Implant) ×2 IMPLANT
DERMABOND ADVANCED (GAUZE/BANDAGES/DRESSINGS) ×3
DERMABOND ADVANCED .7 DNX12 (GAUZE/BANDAGES/DRESSINGS) ×3 IMPLANT
DRAPE C-ARM 42X72 X-RAY (DRAPES) ×4 IMPLANT
DRAPE LAPAROTOMY 100X72X124 (DRAPES) ×2 IMPLANT
DRAPE POUCH INSTRU U-SHP 10X18 (DRAPES) ×2 IMPLANT
DRAPE PROXIMA HALF (DRAPES) ×2 IMPLANT
DRSG EMULSION OIL 3X3 NADH (GAUZE/BANDAGES/DRESSINGS) IMPLANT
ELECT REM PT RETURN 9FT ADLT (ELECTROSURGICAL) ×2
ELECTRODE REM PT RTRN 9FT ADLT (ELECTROSURGICAL) ×1 IMPLANT
GAUZE SPONGE 4X4 16PLY XRAY LF (GAUZE/BANDAGES/DRESSINGS) ×4 IMPLANT
GLOVE BIOGEL PI IND STRL 7.0 (GLOVE) ×2 IMPLANT
GLOVE BIOGEL PI IND STRL 8 (GLOVE) ×2 IMPLANT
GLOVE BIOGEL PI INDICATOR 7.0 (GLOVE) ×2
GLOVE BIOGEL PI INDICATOR 8 (GLOVE) ×2
GLOVE ECLIPSE 6.5 STRL STRAW (GLOVE) ×2 IMPLANT
GLOVE ECLIPSE 7.5 STRL STRAW (GLOVE) ×10 IMPLANT
GLOVE EXAM NITRILE LRG STRL (GLOVE) IMPLANT
GLOVE EXAM NITRILE MD LF STRL (GLOVE) ×2 IMPLANT
GLOVE EXAM NITRILE XL STR (GLOVE) IMPLANT
GLOVE EXAM NITRILE XS STR PU (GLOVE) IMPLANT
GLOVE SS BIOGEL STRL SZ 6.5 (GLOVE) ×2 IMPLANT
GLOVE SUPERSENSE BIOGEL SZ 6.5 (GLOVE) ×2
GLOVE SURG SS PI 7.0 STRL IVOR (GLOVE) ×2 IMPLANT
GOWN BRE IMP SLV AUR LG STRL (GOWN DISPOSABLE) ×4 IMPLANT
GOWN BRE IMP SLV AUR XL STRL (GOWN DISPOSABLE) ×6 IMPLANT
GOWN STRL REIN 2XL LVL4 (GOWN DISPOSABLE) IMPLANT
HEMOSTAT POWDER KIT SURGIFOAM (HEMOSTASIS) ×2 IMPLANT
KIT BASIN OR (CUSTOM PROCEDURE TRAY) ×2 IMPLANT
KIT ROOM TURNOVER OR (KITS) ×2 IMPLANT
MILL MEDIUM DISP (BLADE) ×4 IMPLANT
NEEDLE 18GX1X1/2 (RX/OR ONLY) (NEEDLE) ×2 IMPLANT
NEEDLE BONE MARROW 8GAX6 (NEEDLE) ×2 IMPLANT
NEEDLE SPNL 18GX3.5 QUINCKE PK (NEEDLE) ×4 IMPLANT
NEEDLE SPNL 22GX3.5 QUINCKE BK (NEEDLE) ×2 IMPLANT
NS IRRIG 1000ML POUR BTL (IV SOLUTION) ×8 IMPLANT
PACK LAMINECTOMY NEURO (CUSTOM PROCEDURE TRAY) ×2 IMPLANT
PAD ARMBOARD 7.5X6 YLW CONV (MISCELLANEOUS) ×6 IMPLANT
PATTIES SURGICAL .5 X.5 (GAUZE/BANDAGES/DRESSINGS) ×2 IMPLANT
PATTIES SURGICAL .5 X1 (DISPOSABLE) IMPLANT
PATTIES SURGICAL 1X1 (DISPOSABLE) IMPLANT
PEEK PLIF AVS 13X25X4 (Peek) ×2 IMPLANT
ROD 70MM (Rod) ×1 IMPLANT
ROD 80MM (Rod) ×1 IMPLANT
ROD SPNL 70X5.5XNS TI RDS (Rod) ×1 IMPLANT
ROD SPNL 80X5.5 NS TI RDS (Rod) ×1 IMPLANT
SCREW 5.75X45MM (Screw) ×4 IMPLANT
SCREW 6.75X50MM (Screw) ×8 IMPLANT
SPONGE GAUZE 4X4 12PLY (GAUZE/BANDAGES/DRESSINGS) ×2 IMPLANT
SPONGE LAP 4X18 X RAY DECT (DISPOSABLE) IMPLANT
SPONGE NEURO XRAY DETECT 1X3 (DISPOSABLE) ×4 IMPLANT
SPONGE SURGIFOAM ABS GEL 100 (HEMOSTASIS) IMPLANT
STAPLER SKIN PROX WIDE 3.9 (STAPLE) IMPLANT
STRIP BIOACTIVE VITOSS 25X100X (Neuro Prosthesis/Implant) ×4 IMPLANT
STRIP CLOSURE SKIN 1/2X4 (GAUZE/BANDAGES/DRESSINGS) IMPLANT
SUT PROLENE 6 0 BV (SUTURE) IMPLANT
SUT VIC AB 1 CT1 18XBRD ANBCTR (SUTURE) ×2 IMPLANT
SUT VIC AB 1 CT1 8-18 (SUTURE) ×2
SUT VIC AB 2-0 CP2 18 (SUTURE) ×6 IMPLANT
SYR 20CC LL (SYRINGE) ×2 IMPLANT
SYR 5ML LL (SYRINGE) IMPLANT
SYR CONTROL 10ML LL (SYRINGE) ×2 IMPLANT
TAPE CLOTH SURG 4X10 WHT LF (GAUZE/BANDAGES/DRESSINGS) ×2 IMPLANT
TOWEL OR 17X24 6PK STRL BLUE (TOWEL DISPOSABLE) ×2 IMPLANT
TOWEL OR 17X26 10 PK STRL BLUE (TOWEL DISPOSABLE) ×2 IMPLANT
TRAP SPECIMEN MUCOUS 40CC (MISCELLANEOUS) IMPLANT
TRAY FOLEY CATH 14FRSI W/METER (CATHETERS) ×2 IMPLANT
WATER STERILE IRR 1000ML POUR (IV SOLUTION) ×2 IMPLANT

## 2012-10-19 NOTE — Transfer of Care (Signed)
Immediate Anesthesia Transfer of Care Note  Patient: Matthew Robinson  Procedure(s) Performed: Procedure(s) with comments: L34 L45 decompression with posterior lumbar interbody fusion with interbody prothesis posterolateral arthrodesis and posterior segmental instrumentation (N/A) - Lumbar Three-Four Lumbar Four-Five Decompression with Posterior Lumbar Interbody Fusion with interbody prothesis posterolateral arthrodesis and posterior segmental instrumentation  Patient Location: PACU  Anesthesia Type:General  Level of Consciousness: awake, alert , oriented and patient cooperative  Airway & Oxygen Therapy: Patient Spontanous Breathing and Patient connected to face mask oxygen  Post-op Assessment: Report given to PACU RN, Post -op Vital signs reviewed and stable and Patient moving all extremities  Post vital signs: Reviewed and stable  Complications: No apparent anesthesia complications

## 2012-10-19 NOTE — Anesthesia Preprocedure Evaluation (Signed)
Anesthesia Evaluation  Patient identified by MRN, date of birth, ID band Patient awake    Reviewed: Allergy & Precautions, H&P , NPO status , Patient's Chart, lab work & pertinent test results, reviewed documented beta blocker date and time   Airway Mallampati: II TM Distance: >3 FB Neck ROM: full    Dental   Pulmonary sleep apnea ,  breath sounds clear to auscultation        Cardiovascular negative cardio ROS  Rhythm:regular     Neuro/Psych negative neurological ROS  negative psych ROS   GI/Hepatic negative GI ROS, Neg liver ROS,   Endo/Other  negative endocrine ROS  Renal/GU negative Renal ROS  negative genitourinary   Musculoskeletal   Abdominal   Peds  Hematology negative hematology ROS (+)   Anesthesia Other Findings See surgeon's H&P   Reproductive/Obstetrics negative OB ROS                           Anesthesia Physical Anesthesia Plan  ASA: II  Anesthesia Plan: General   Post-op Pain Management:    Induction: Intravenous  Airway Management Planned: Oral ETT  Additional Equipment:   Intra-op Plan:   Post-operative Plan: Extubation in OR  Informed Consent: I have reviewed the patients History and Physical, chart, labs and discussed the procedure including the risks, benefits and alternatives for the proposed anesthesia with the patient or authorized representative who has indicated his/her understanding and acceptance.   Dental Advisory Given  Plan Discussed with: CRNA and Surgeon  Anesthesia Plan Comments:         Anesthesia Quick Evaluation

## 2012-10-19 NOTE — Preoperative (Signed)
Beta Blockers   Reason not to administer Beta Blockers:Not Applicable 

## 2012-10-19 NOTE — Anesthesia Postprocedure Evaluation (Signed)
Anesthesia Post Note  Patient: Matthew Robinson  Procedure(s) Performed: Procedure(s) (LRB): L34 L45 decompression with posterior lumbar interbody fusion with interbody prothesis posterolateral arthrodesis and posterior segmental instrumentation (N/A)  Anesthesia type: general  Patient location: PACU  Post pain: Pain level controlled  Post assessment: Patient's Cardiovascular Status Stable  Last Vitals:  Filed Vitals:   10/19/12 1912  BP: 121/56  Pulse: 64  Temp:   Resp: 19    Post vital signs: Reviewed and stable  Level of consciousness: sedated  Complications: No apparent anesthesia complications

## 2012-10-19 NOTE — Progress Notes (Signed)
Filed Vitals:   10/19/12 1847 10/19/12 1848 10/19/12 1857 10/19/12 1912  BP:   127/60 121/56  Pulse: 64 65 60 64  Temp:      TempSrc:      Resp: 18 16 15 19   SpO2: 99% 99% 96% 98%    Patient in recovery room, comfortable. Dressing clean and dry. Moving all extremities. Foley to straight drainage. To be transferred to 3500 once fully recovered.  Plan: Progress to postoperative recovery.  Hewitt Shorts, MD 10/19/2012, 7:37 PM

## 2012-10-19 NOTE — Anesthesia Procedure Notes (Signed)
Procedure Name: Intubation Date/Time: 10/19/2012 12:22 PM Performed by: Jerilee Hoh Pre-anesthesia Checklist: Patient identified, Emergency Drugs available, Suction available and Patient being monitored Patient Re-evaluated:Patient Re-evaluated prior to inductionOxygen Delivery Method: Circle system utilized Preoxygenation: Pre-oxygenation with 100% oxygen Intubation Type: IV induction Ventilation: Mask ventilation without difficulty Laryngoscope Size: Mac and 4 Grade View: Grade III Tube type: Oral Tube size: 7.5 mm Number of attempts: 1 Airway Equipment and Method: Stylet Placement Confirmation: ETT inserted through vocal cords under direct vision,  positive ETCO2 and breath sounds checked- equal and bilateral Secured at: 23 cm Tube secured with: Tape Dental Injury: Teeth and Oropharynx as per pre-operative assessment

## 2012-10-19 NOTE — H&P (Signed)
Subjective: Patient is a 67 y.o. 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  Patient had difficulties with his low back for many years, he's undergone a number of different treatments including spinal injections and NSAIDS . However a little over a month ago he developed acute pain in the left buttock, with progressive weakness in the left thigh and dysesthesias and burning in the anterolateral left leg. MRI scan shows the worse degenerative changes at the L3-4 and L4-5 levels. At L3-4 there circumferential disc bulging, facet and ligamentum flavum hypertrophy with some synovial cyst within the ligamentum flavum, and resulting marked multifactorial canal stenosis. At L4-5 there circumferential degenerative disc bulging, facet and ligamentum flavum hypertrophy, with some synovial cyst within the ligamentum flavum, and the probable left L4-5 foraminal disc herniation, which is a new finding as compared to his MRI from July 2012. He is now admitted for an L3-L5 lumbar decompression including laminectomy, facetectomy, and foraminotomy, as well as microdiscectomy, a bilateral L3-4 and L4-5 posterior lumbar interbody arthrodesis with interbody implants and bone graft, and a bilateral L3-L5 posterolateral arthrodesis with postreduction patient and bone graft.   Past Medical History  Diagnosis Date  . Hyperlipemia   . Melanoma of skin     left shoulder  . Mitral valve prolapse   . Sleep apnea     Past Surgical History  Procedure Laterality Date  . Melanoma excision    . Retinal detachment surgery    . Eye surgery      cataracts  . Colonoscopy    . Meniscus repair  1970    Prescriptions prior to admission  Medication Sig Dispense Refill  . aspirin EC 81 MG tablet Take 162 mg by mouth daily.      .  Cholecalciferol (VITAMIN D) 2000 UNITS tablet Take 2,000 Units by mouth daily.      . Multiple Vitamin (MULTIVITAMIN WITH MINERALS) TABS Take 1 tablet by mouth daily.      . Omega-3 Fatty Acids (FISH OIL) 1200 MG CAPS Take 1,200 mg by mouth daily.       No Known Allergies  History  Substance Use Topics  . Smoking status: Never Smoker   . Smokeless tobacco: Not on file  . Alcohol Use: 0.0 oz/week    1-3 Glasses of wine per week    History reviewed. No pertinent family history.   Review of Systems A comprehensive review of systems was negative.  Objective: Vital signs in last 24 hours: Temp:  [97 F (36.1 C)] 97 F (36.1 C) (04/09 0955) Pulse Rate:  [60] 60 (04/09 0955) Resp:  [20] 20 (04/09 0955) BP: (152)/(74) 152/74 mmHg (04/09 0955) SpO2:  [98 %] 98 % (04/09 0955)  EXAM: Patient is a well-developed well-nourished white male in no acute distress. Lungs are clear to auscultation , the patient has symmetrical respiratory excursion. Heart has a regular rate and rhythm normal S1 and S2 no murmur.   Abdomen is soft nontender nondistended bowel sounds are present. Extremity examination shows no clubbing cyanosis or edema. Musculoskeletal examination shows no tenderness to palpation over the lumbar spinous processes or per spinal musculature. He is able to flex 90, but can only stand up to about 0, and in fact his neutral standing position is slightly flexed forward. Straight leg raising is negative bilaterally. Motor examination shows the left  iliopsoas is 4-4+. The right iliopsoas is 5. Quadriceps, dorsiflexor, and plantar flexors are 5 bilaterally. The EHL is 3-4 minus bilaterally. Sensation is decreased to pinprick in the lateral aspect of the left foot. Reflexes are minimal at the quadriceps and gastrocnemius, they're symmetrical bilaterally. Toes are downgoing. His gait and stance are noted from L4 forward flexed posture.  Data Review:CBC    Component Value Date/Time   WBC 4.8  10/17/2012 1115   RBC 5.06 10/17/2012 1115   HGB 16.0 10/17/2012 1115   HCT 46.3 10/17/2012 1115   PLT 142* 10/17/2012 1115   MCV 91.5 10/17/2012 1115   MCH 31.6 10/17/2012 1115   MCHC 34.6 10/17/2012 1115   RDW 13.8 10/17/2012 1115                          BMET    Component Value Date/Time   NA 139 10/17/2012 1115   K 4.0 10/17/2012 1115   CL 104 10/17/2012 1115   CO2 27 10/17/2012 1115   GLUCOSE 93 10/17/2012 1115   BUN 14 10/17/2012 1115   CREATININE 0.78 10/17/2012 1115   CALCIUM 9.6 10/17/2012 1115   GFRNONAA >90 10/17/2012 1115   GFRAA >90 10/17/2012 1115     Assessment/Plan: Patient with acute left lumbar radiculopathy, and long history of difficulties with his low back, with advanced degenerative changes at the L3-4 and L4-5 levels, with a probable new left L4-5 foraminal disc herniation, is now admitted for lumbar decompression and arthrodesis.  I've discussed with the patient the nature of his condition, the nature the surgical procedure, the typical length of surgery, hospital stay, and overall recuperation, the limitations postoperatively, and risks of surgery. I discussed risks including risks of infection, bleeding, possibly need for transfusion, the risk of nerve root dysfunction with pain, weakness, numbness, or paresthesias, the risk of dural tear and CSF leakage and possible need for further surgery, the risk of failure of the arthrodesis and possibly for further surgery, the risk of anesthetic complications including myocardial infarction, stroke, pneumonia, and death. We discussed the need for postoperative immobilization in a lumbar brace. Understanding all this the patient does wish to proceed with surgery and is admitted for such.     Hewitt Shorts, MD 10/19/2012 10:41 AM

## 2012-10-19 NOTE — Op Note (Signed)
10/19/2012  6:39 PM  PATIENT:  Matthew Robinson  67 y.o. 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, degenerative grade 1 spondylolisthesis,  lumbar spondylosis,  lumbar  degenerative disc disease, bilateral lumbar radiculopathy  PROCEDURE:  Procedure(s): Bilateral L3-4 and  L4-5 decompression including bilateral L3, L4, and L5 laminectomy, bilateral L3-4 and L4-5 facetectomies and foraminotomies with decompression of the exiting L3, L4, and L5 nerve roots bilaterally, with microdissection and microsurgical technique, with decompression beyond that required for interbody arthrodesis;  bilateral L3-4 and L4-5  posterior lumbar interbody  arthrodesis with AVS peek  interbody  Implants, Vitoss BA with bone marrow aspirate, and locally harvested morcellized autograft; bilateral L3-L5 posterolateral arthrodesis  with radius posterior segmental instrumentation, Vitoss BA with bone marrow aspirate, and locally harvested morcellized autograft   SURGEON:  Surgeon(s): Hewitt Shorts, MD Carmela Hurt, MD  ASSISTANTS:  Coletta Memos, MD  ANESTHESIA:   general  EBL:  Total I/O In: 3000 [I.V.:2700; Blood:300] Out: 1035 [Urine:335; Blood:700]  BLOOD ADMINISTERED:300 CC CELLSAVER  COUNT: Correct per nursing staff   DICTATION: Patient was brought to the operating room placed under general endotracheal anesthesia. The patient was turned to prone position, the lumbar region was prepped with Betadine soap and solution and draped in a sterile fashion. The midline was infiltrated with local anesthesia with epinephrine. A localizing x-ray was taken and then a midline incision was made and carried down through the subcutaneous tissue,  bipolar cautery and electrocautery were used to maintain hemostasis. Dissection was carried down to the lumbar fascia. The fascia was incised bilaterally and the paraspinal muscles were dissected with a spinous process and lamina in a subperiosteal fashion. Another x-ray was taken for localization and the L3, L4, and L5 levels were localized. Dissection was then carried out laterally over the facet complexes, which were markedly hypertrophic. We began the laminectomy using double-action rongeurs a high-speed drill. Bilateral facetectomy was performed at the L3-4 and L4-5 levels and we are thereby able to expose the transverse processes of L3, L4, and L5, bilaterally, and they were decorticated. We continued the decompression via the laminectomy of L3, L4, and L5, using the high-speed drill and Kerrison punches. Dissection was carried out laterally including facetectomy and foraminotomies with decompression of the stenotic compression of the L3, L4, and L5 nerve roots. There was severe neural foraminal stenosis bilaterally at L4-5. On the left side there was also a large disc herniation within the foramen compressing the exiting left L4 nerve root. Once the bony decompression was completed we began a bilateral discectomy at both the L3-4 and L4-5 levels. However we started on the left side L4-5, and removed the large disc herniation compressing the exiting left L4 nerve root. Once the decompression of the stenotic compression of the thecal sac and exiting nerve roots was completed we proceeded with the posterior lumbar interbody arthrodesis. We continued the discectomy at each level bilaterally. A thorough discectomy was performed using pituitary rongeurs and curettes. Once the discectomy was completed we began to prepare the endplate surfaces, removing the cartilaginous endplates surface with paddle curettes. We then measured the height of the intervertebral disc space. We selected 10 x 25 x 4 AVS peek interbody  implants for the L3-4 level, and 13 x  25 x 4 AVS peek interbody implants for the L4-5 level.  The C-arm fluoroscope was then draped and brought in the field and we identified the pedicle entry points bilaterally at the L3, L4, and L5 levels. Each of the 6 pedicles was probed, we aspirated bone marrow aspirate from the vertebral bodies, this was injected over 2 10 cc strips of Vitoss BA. Then each of the pedicles was examined with the ball probe, good bony surfaces were found and no bony cuts were found. Each of the L4 and L5 pedicles was then tapped with a 6.25 mm tap, again examined with the ball probe good threading was found and no bony cuts were found. We then placed 6.75 by 50 millimeter screws bilaterally at the L4 and L5 levels.  At the L3 level each of the pedicles was tapped with a 5.25 mm tap, again examined with the ball probe, good threading was found, with no bony cuts. We then placed 5.75 by 45 millimeter screws bilaterally at the L3 level.  We then packed the AVS peek interbody implants with Vitoss BA with bone marrow aspirate and locally harvested morcellized autograft, and then placed the first implant at the L4-5 level on the right side, carefully retracting the thecal sac and nerve root medially. We then went back to the left side and packed the midline with additional Vitoss BA with bone marrow aspirate and locally harvested morcellized autograft, and then placed a second implant on the left side again retracting the thecal sac and nerve root medially. Additional locally harvested morcellized autograft and Vitoss BA with bone marrow aspirate was packed lateral to the implants.  Then at the L3-4 level, we placed the first implant on the right side, carefully retracting the thecal sac and nerve root medially. We then went back to the left side and packed the midline with additional Vitoss BA with bone marrow aspirate and locally harvested morcellized autograft, and then placed a second implant  on the left side, again retracting the thecal sac and nerve root medially.   We then packed the lateral gutter over the transverse processes and intertransverse space with Vitoss BA with bone marrow aspirate and locally harvested morcellized autograft. We then selected pre-lordosed rods, using an 70 mm rod on the right, and then 80 mm rod on the left, they were placed within the screw heads and secured with locking caps once all 6 locking caps were placed final tightening was performed against a counter torque. We then placed a variable medium cross connector between the screws at L4 and L5. It was locked to the rods bilaterally, and then the central locking collar was tightened down  The wound had been irrigated multiple times during the procedure with saline solution and bacitracin solution, good hemostasis was established with a combination of bipolar cautery, Gelfoam with thrombin, and Surgifoam. Once good hemostasis was confirmed we proceeded with closure paraspinal muscles deep fascia and Scarpa's fascia were closed with interrupted undyed 1 Vicryl sutures the subcutaneous and subcuticular closed with interrupted inverted 2-0 undyed Vicryl sutures the skin edges were approximated with Dermabond. The wound was dressed with sterile gauze and Hypafix.  Following surgery the patient was turned back to the supine position to be reversed and the anesthetic extubated and transferred to the recovery room for further care.    PLAN OF CARE: Admit to inpatient   PATIENT DISPOSITION:  PACU - hemodynamically stable.   Delay start of Pharmacological VTE agent (>24hrs) due to surgical blood  loss or risk of bleeding:  yes

## 2012-10-20 NOTE — Progress Notes (Signed)
Filed Vitals:   10/19/12 2346 10/20/12 0023 10/20/12 0436 10/20/12 0810  BP: 149/70  139/71 109/69  Pulse: 75 75 67 69  Temp: 98 F (36.7 C)  98.4 F (36.9 C) 98.7 F (37.1 C)  TempSrc:      Resp: 18 16 18 20   SpO2: 97% 98% 100% 94%    Patient sitting up in chair at bedside, has ambulated in the halls. Dressing clean and dry. Foley DC'd at 0600, has been voiding small-volume's, will have bladder scan done to check bladder emptying. Encouraged to increase ambulation.   Plan: Continue to progress through post-operative recovery.  Encouraged to ambulate.  Hewitt Shorts, MD 10/20/2012, 9:56 AM

## 2012-10-20 NOTE — Progress Notes (Signed)
UR COMPLETED  

## 2012-10-21 MED ORDER — CYCLOBENZAPRINE HCL 10 MG PO TABS: 10.0000 mg | ORAL_TABLET | Freq: Three times a day (TID) | ORAL | Status: DC | PRN

## 2012-10-21 MED ORDER — OXYCODONE-ACETAMINOPHEN 5-325 MG PO TABS: 1.0000 | ORAL_TABLET | ORAL | Status: DC | PRN

## 2012-10-21 NOTE — Discharge Summary (Signed)
Physician Discharge Summary  Patient ID: Matthew Robinson MRN: [id number] DOB/AGE: 1945-07-21 67 y.o.  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 Postoperatively he has done well. He has had good recovery of motor function in his lower extremities. Wound is clean and dry. He is up and ambulating. He is voiding well. He is being discharged home with instructions regarding wound care and activities. He is to return for followup with me in 3-4 weeks.  Discharge Exam: Blood pressure 110/69, pulse 54, temperature 97.6 F (36.4 C), temperature source Oral, resp. rate 18, SpO2 99.00%.  Disposition: Home     Medication List    TAKE these medications       aspirin EC 81 MG tablet  Take 162 mg by mouth daily.     cyclobenzaprine 10 MG tablet  Commonly known as:  FLEXERIL  Take 1 tablet (10 mg total) by mouth 3 (three) times daily as needed for muscle spasms.     Fish Oil 1200 MG Caps  Take 1,200 mg by mouth daily.     multivitamin with minerals Tabs  Take 1 tablet by mouth daily.     oxyCODONE-acetaminophen 5-325 MG per tablet  Commonly known as:  PERCOCET/ROXICET  Take 1-2 tablets by mouth every 4 (four) hours as needed for pain.     Vitamin D 2000 UNITS tablet  Take 2,000 Units by mouth daily.         Signed: Hewitt Shorts, MD 10/21/2012, 8:22 AM

## 2012-10-21 NOTE — Progress Notes (Signed)
Pt. Placed his mask on and RT made sure the cpap setting was correct. Pt. Is tolerating well at this time.

## 2012-10-21 NOTE — Progress Notes (Signed)
Pt. discharged home accompanied by husband. Prescriptions and discharge instructions given with verbalization of understanding. Incision site on back with no s/s of infection - no swelling, redness, bleeding, and/or drainage noted. . Pain med given just before leaving. Opportunity given to ask questions but no question asked. Pt. transported out of this unit in wheelchair by the nurse tech  

## 2012-10-24 MED FILL — Heparin Sodium (Porcine) Inj 1000 Unit/ML: INTRAMUSCULAR | Qty: 30 | Status: AC

## 2012-10-24 MED FILL — Sodium Chloride IV Soln 0.9%: INTRAVENOUS | Qty: 3000 | Status: AC

## 2012-10-25 DIAGNOSIS — M5126 Other intervertebral disc displacement, lumbar region: Secondary | ICD-10-CM | POA: Diagnosis not present

## 2012-10-28 ENCOUNTER — Emergency Department (HOSPITAL_COMMUNITY)
Admission: EM | Admit: 2012-10-28 | Discharge: 2012-10-28 | Disposition: A | Discharge status: Home / Self Care | Payer: Medicare Other | Attending: Emergency Medicine | Admitting: Emergency Medicine

## 2012-10-28 ENCOUNTER — Encounter (HOSPITAL_COMMUNITY): Payer: Self-pay | Admitting: *Deleted

## 2012-10-28 DIAGNOSIS — Z8669 Personal history of other diseases of the nervous system and sense organs: Secondary | ICD-10-CM | POA: Insufficient documentation

## 2012-10-28 DIAGNOSIS — R609 Edema, unspecified: Secondary | ICD-10-CM | POA: Diagnosis not present

## 2012-10-28 DIAGNOSIS — M79609 Pain in unspecified limb: Secondary | ICD-10-CM | POA: Diagnosis not present

## 2012-10-28 DIAGNOSIS — M7989 Other specified soft tissue disorders: Secondary | ICD-10-CM | POA: Diagnosis not present

## 2012-10-28 DIAGNOSIS — Z7982 Long term (current) use of aspirin: Secondary | ICD-10-CM | POA: Insufficient documentation

## 2012-10-28 DIAGNOSIS — Z85820 Personal history of malignant melanoma of skin: Secondary | ICD-10-CM | POA: Insufficient documentation

## 2012-10-28 DIAGNOSIS — Z9889 Other specified postprocedural states: Secondary | ICD-10-CM | POA: Diagnosis not present

## 2012-10-28 DIAGNOSIS — Z79899 Other long term (current) drug therapy: Secondary | ICD-10-CM | POA: Insufficient documentation

## 2012-10-28 DIAGNOSIS — Z8679 Personal history of other diseases of the circulatory system: Secondary | ICD-10-CM | POA: Diagnosis not present

## 2012-10-28 DIAGNOSIS — E785 Hyperlipidemia, unspecified: Secondary | ICD-10-CM | POA: Diagnosis not present

## 2012-10-28 MED ORDER — FUROSEMIDE 20 MG PO TABS
40.0000 mg | ORAL_TABLET | Freq: Once | ORAL | Status: AC
  Administered 2012-10-28: 40 mg via ORAL
  Filled 2012-10-28: qty 2

## 2012-10-28 MED ORDER — FUROSEMIDE 20 MG PO TABS: 20.0000 mg | ORAL_TABLET | Freq: Two times a day (BID) | ORAL | Status: DC

## 2012-10-28 NOTE — Progress Notes (Signed)
*  Preliminary Results* Bilateral lower extremity venous duplex completed. Bilateral lower extremities are negative for deep vein thrombosis. Preliminary results discussed with Junious Silk, PA.  10/28/2012 4:17 PM Gertie Fey, RDMS, RDCS

## 2012-10-28 NOTE — ED Notes (Signed)
Post. Tib. Pulses found with doppler.  No distress noted.

## 2012-10-28 NOTE — ED Notes (Addendum)
Pt c/o bil ankle/feet swelling X 1 wk. Pt states last night left foot had increased edema/red and felt "tingly". Pt took 40mg   Lasix today. Edema noted to bil feet. Pt had spine surgery lst wk.

## 2012-10-28 NOTE — ED Provider Notes (Signed)
History     CSN: [id number]  Arrival date & time 10/28/12  1350   First MD Initiated Contact with Patient 10/28/12 1404      Chief Complaint  Patient presents with  . Leg Pain    (Consider location/radiation/quality/duration/timing/severity/associated sxs/prior treatment) HPI Comments: Is a 67 year old man who presents today with leg swelling. He had a 6 hour spinal surgery done approximately a week and half ago. He called his neurosurgeon who was not in the office today. They told him to go to the emergency room. He states sometimes he gets swelling, but never this badly. He first noticed the swelling at 3 AM this morning. He states it is painful and feels like his skin may burst because there is so much pressure. He states there was an area of erythema that extended into his mid tibial area. When this happens he usually takes his Lasix dose. 3 hours ago he took 40 mg of Lasix. His wife states he took Lasix on Tuesday (4 days ago) because there is mild edema. He denies any shortness of breath, chest pain, orthopnea, nausea, vomiting, weakness, fever, chills.   Patient is a 67 y.o. male presenting with leg pain. The history is provided by the patient. No language interpreter was used.  Leg Pain Location:  Ankle and foot Time since incident:  11 hours Injury: no   Ankle location:  L ankle and R ankle Foot location:  L foot and R foot Pain details:    Quality:  Pressure   Radiates to:  Does not radiate   Severity:  Moderate   Onset quality:  Gradual   Timing:  Constant   Progression:  Worsening Chronicity:  New Dislocation: no   Foreign body present:  No foreign bodies Associated symptoms: no fever     Past Medical History  Diagnosis Date  . Hyperlipemia   . Melanoma of skin     left shoulder  . Mitral valve prolapse   . Sleep apnea     Past Surgical History  Procedure Laterality Date  . Melanoma excision    . Retinal detachment surgery    . Eye surgery      cataracts   . Colonoscopy    . Meniscus repair  1970  . Spine surgery      No family history on file.  History  Substance Use Topics  . Smoking status: Never Smoker   . Smokeless tobacco: Not on file  . Alcohol Use: 0.0 oz/week    1-3 Glasses of wine per week      Review of Systems  Constitutional: Negative for fever and chills.  Respiratory: Negative for chest tightness and shortness of breath.   Cardiovascular: Positive for leg swelling. Negative for chest pain.  All other systems reviewed and are negative.    Allergies  Review of patient's allergies indicates no known allergies.  Home Medications   Current Outpatient Rx  Name  Route  Sig  Dispense  Refill  . aspirin EC 81 MG tablet   Oral   Take 162 mg by mouth daily.         . Cholecalciferol (VITAMIN D) 2000 UNITS tablet   Oral   Take 2,000 Units by mouth daily.         . cyclobenzaprine (FLEXERIL) 10 MG tablet   Oral   Take 1 tablet (10 mg total) by mouth 3 (three) times daily as needed for muscle spasms.   50 tablet   0   .  Multiple Vitamin (MULTIVITAMIN WITH MINERALS) TABS   Oral   Take 1 tablet by mouth daily.         . Omega-3 Fatty Acids (FISH OIL) 1200 MG CAPS   Oral   Take 1,200 mg by mouth daily.         Marland Kitchen oxyCODONE-acetaminophen (PERCOCET/ROXICET) 5-325 MG per tablet   Oral   Take 1-2 tablets by mouth every 4 (four) hours as needed for pain.   70 tablet   0     There were no vitals taken for this visit.  Physical Exam  Nursing note and vitals reviewed. Constitutional: He is oriented to person, place, and time. He appears well-developed and well-nourished. No distress.  HENT:  Head: Normocephalic and atraumatic.  Right Ear: External ear normal.  Left Ear: External ear normal.  Nose: Nose normal.  Eyes: Conjunctivae are normal.  Neck: Normal range of motion. No tracheal deviation present.  Cardiovascular: Normal rate, regular rhythm and normal heart sounds.   Pulses:      Dorsalis  pedis pulses are 1+ on the right side, and 1+ on the left side.  1+ pitting edema in bilateral lower extremities  Pulmonary/Chest: Effort normal and breath sounds normal. No stridor.  Abdominal: Soft. He exhibits no distension. There is no tenderness.  Musculoskeletal: Normal range of motion.  Neurological: He is alert and oriented to person, place, and time.  Skin: Skin is warm and dry. He is not diaphoretic.  Psychiatric: He has a normal mood and affect. His behavior is normal.    ED Course  Procedures (including critical care time)  Labs Reviewed - No data to display No results found.   1. Peripheral edema       MDM  Patient presents for bilateral leg swelling, left worse than right. He is s/p a 6 hour back surgery. He does take lasix because sometimes his legs swell. He has lasix which he takes at home. He received 40 mg of PO lasix here. Bilateral venous US negative for DVT. Follow up with PCP. Discussed plan with patient. He verbalizes understanding. Return precautions given. Vital signs stable for discharge. Patient / Family / Caregiver informed of clinical course, understand medical decision-making process, and agree with plan.         Mora Bellman, PA-C 10/28/12 [phone]

## 2012-10-31 NOTE — ED Provider Notes (Signed)
Medical screening examination/treatment/procedure(s) were performed by non-physician practitioner and as supervising physician I was immediately available for consultation/collaboration.   Loren Racer, MD 10/31/12 [phone]

## 2012-11-01 ENCOUNTER — Other Ambulatory Visit: Payer: Self-pay | Admitting: Internal Medicine

## 2012-11-01 DIAGNOSIS — R609 Edema, unspecified: Secondary | ICD-10-CM | POA: Diagnosis not present

## 2012-11-01 DIAGNOSIS — R001 Bradycardia, unspecified: Secondary | ICD-10-CM

## 2012-11-01 DIAGNOSIS — I739 Peripheral vascular disease, unspecified: Secondary | ICD-10-CM | POA: Diagnosis not present

## 2012-11-01 DIAGNOSIS — R238 Other skin changes: Secondary | ICD-10-CM | POA: Diagnosis not present

## 2012-11-07 ENCOUNTER — Ambulatory Visit
Admission: RE | Admit: 2012-11-07 | Discharge: 2012-11-07 | Disposition: A | Discharge status: Home / Self Care | Payer: Medicare Other | Source: Ambulatory Visit | Attending: Internal Medicine | Admitting: Internal Medicine

## 2012-11-07 DIAGNOSIS — R001 Bradycardia, unspecified: Secondary | ICD-10-CM

## 2012-11-07 DIAGNOSIS — R29898 Other symptoms and signs involving the musculoskeletal system: Secondary | ICD-10-CM | POA: Diagnosis not present

## 2012-12-15 DIAGNOSIS — H2510 Age-related nuclear cataract, unspecified eye: Secondary | ICD-10-CM | POA: Diagnosis not present

## 2012-12-20 DIAGNOSIS — K219 Gastro-esophageal reflux disease without esophagitis: Secondary | ICD-10-CM | POA: Diagnosis not present

## 2012-12-20 DIAGNOSIS — Z1331 Encounter for screening for depression: Secondary | ICD-10-CM | POA: Diagnosis not present

## 2012-12-20 DIAGNOSIS — R7989 Other specified abnormal findings of blood chemistry: Secondary | ICD-10-CM | POA: Diagnosis not present

## 2012-12-20 DIAGNOSIS — E782 Mixed hyperlipidemia: Secondary | ICD-10-CM | POA: Diagnosis not present

## 2012-12-20 DIAGNOSIS — G4730 Sleep apnea, unspecified: Secondary | ICD-10-CM | POA: Diagnosis not present

## 2012-12-20 DIAGNOSIS — N400 Enlarged prostate without lower urinary tract symptoms: Secondary | ICD-10-CM | POA: Diagnosis not present

## 2012-12-20 DIAGNOSIS — Z0000 Encounter for general adult medical examination without abnormal findings: Secondary | ICD-10-CM | POA: Diagnosis not present

## 2012-12-20 DIAGNOSIS — J309 Allergic rhinitis, unspecified: Secondary | ICD-10-CM | POA: Diagnosis not present

## 2013-01-05 DIAGNOSIS — R972 Elevated prostate specific antigen [PSA]: Secondary | ICD-10-CM | POA: Diagnosis not present

## 2013-01-20 DIAGNOSIS — M5137 Other intervertebral disc degeneration, lumbosacral region: Secondary | ICD-10-CM | POA: Diagnosis not present

## 2013-01-20 DIAGNOSIS — M5126 Other intervertebral disc displacement, lumbar region: Secondary | ICD-10-CM | POA: Diagnosis not present

## 2013-01-20 DIAGNOSIS — M47817 Spondylosis without myelopathy or radiculopathy, lumbosacral region: Secondary | ICD-10-CM | POA: Diagnosis not present

## 2013-01-20 DIAGNOSIS — M4310 Spondylolisthesis, site unspecified: Secondary | ICD-10-CM | POA: Diagnosis not present

## 2013-02-16 DIAGNOSIS — E039 Hypothyroidism, unspecified: Secondary | ICD-10-CM | POA: Diagnosis not present

## 2013-02-17 DIAGNOSIS — M545 Low back pain, unspecified: Secondary | ICD-10-CM | POA: Diagnosis not present

## 2013-02-17 DIAGNOSIS — R262 Difficulty in walking, not elsewhere classified: Secondary | ICD-10-CM | POA: Diagnosis not present

## 2013-02-17 DIAGNOSIS — M6281 Muscle weakness (generalized): Secondary | ICD-10-CM | POA: Diagnosis not present

## 2013-03-14 DIAGNOSIS — R972 Elevated prostate specific antigen [PSA]: Secondary | ICD-10-CM | POA: Diagnosis not present

## 2013-03-15 DIAGNOSIS — M545 Low back pain, unspecified: Secondary | ICD-10-CM | POA: Diagnosis not present

## 2013-03-15 DIAGNOSIS — R262 Difficulty in walking, not elsewhere classified: Secondary | ICD-10-CM | POA: Diagnosis not present

## 2013-03-15 DIAGNOSIS — M6281 Muscle weakness (generalized): Secondary | ICD-10-CM | POA: Diagnosis not present

## 2013-03-17 DIAGNOSIS — M545 Low back pain, unspecified: Secondary | ICD-10-CM | POA: Diagnosis not present

## 2013-03-17 DIAGNOSIS — M6281 Muscle weakness (generalized): Secondary | ICD-10-CM | POA: Diagnosis not present

## 2013-03-17 DIAGNOSIS — R262 Difficulty in walking, not elsewhere classified: Secondary | ICD-10-CM | POA: Diagnosis not present

## 2013-03-21 DIAGNOSIS — R972 Elevated prostate specific antigen [PSA]: Secondary | ICD-10-CM | POA: Diagnosis not present

## 2013-03-21 DIAGNOSIS — N402 Nodular prostate without lower urinary tract symptoms: Secondary | ICD-10-CM | POA: Diagnosis not present

## 2013-03-22 DIAGNOSIS — M6281 Muscle weakness (generalized): Secondary | ICD-10-CM | POA: Diagnosis not present

## 2013-03-22 DIAGNOSIS — R262 Difficulty in walking, not elsewhere classified: Secondary | ICD-10-CM | POA: Diagnosis not present

## 2013-03-22 DIAGNOSIS — M545 Low back pain, unspecified: Secondary | ICD-10-CM | POA: Diagnosis not present

## 2013-03-24 DIAGNOSIS — M545 Low back pain, unspecified: Secondary | ICD-10-CM | POA: Diagnosis not present

## 2013-03-24 DIAGNOSIS — M6281 Muscle weakness (generalized): Secondary | ICD-10-CM | POA: Diagnosis not present

## 2013-03-24 DIAGNOSIS — R262 Difficulty in walking, not elsewhere classified: Secondary | ICD-10-CM | POA: Diagnosis not present

## 2013-03-29 DIAGNOSIS — M545 Low back pain, unspecified: Secondary | ICD-10-CM | POA: Diagnosis not present

## 2013-03-29 DIAGNOSIS — M6281 Muscle weakness (generalized): Secondary | ICD-10-CM | POA: Diagnosis not present

## 2013-03-29 DIAGNOSIS — R262 Difficulty in walking, not elsewhere classified: Secondary | ICD-10-CM | POA: Diagnosis not present

## 2013-03-31 DIAGNOSIS — M6281 Muscle weakness (generalized): Secondary | ICD-10-CM | POA: Diagnosis not present

## 2013-03-31 DIAGNOSIS — M545 Low back pain, unspecified: Secondary | ICD-10-CM | POA: Diagnosis not present

## 2013-03-31 DIAGNOSIS — R262 Difficulty in walking, not elsewhere classified: Secondary | ICD-10-CM | POA: Diagnosis not present

## 2013-04-05 DIAGNOSIS — M545 Low back pain, unspecified: Secondary | ICD-10-CM | POA: Diagnosis not present

## 2013-04-05 DIAGNOSIS — R262 Difficulty in walking, not elsewhere classified: Secondary | ICD-10-CM | POA: Diagnosis not present

## 2013-04-05 DIAGNOSIS — M6281 Muscle weakness (generalized): Secondary | ICD-10-CM | POA: Diagnosis not present

## 2013-04-11 ENCOUNTER — Other Ambulatory Visit: Payer: Self-pay | Admitting: Dermatology

## 2013-04-11 DIAGNOSIS — Z85820 Personal history of malignant melanoma of skin: Secondary | ICD-10-CM | POA: Diagnosis not present

## 2013-04-11 DIAGNOSIS — L820 Inflamed seborrheic keratosis: Secondary | ICD-10-CM | POA: Diagnosis not present

## 2013-04-11 DIAGNOSIS — D485 Neoplasm of uncertain behavior of skin: Secondary | ICD-10-CM | POA: Diagnosis not present

## 2013-04-11 DIAGNOSIS — D235 Other benign neoplasm of skin of trunk: Secondary | ICD-10-CM | POA: Diagnosis not present

## 2013-04-11 DIAGNOSIS — L851 Acquired keratosis [keratoderma] palmaris et plantaris: Secondary | ICD-10-CM | POA: Diagnosis not present

## 2013-04-12 DIAGNOSIS — R262 Difficulty in walking, not elsewhere classified: Secondary | ICD-10-CM | POA: Diagnosis not present

## 2013-04-12 DIAGNOSIS — M545 Low back pain, unspecified: Secondary | ICD-10-CM | POA: Diagnosis not present

## 2013-04-12 DIAGNOSIS — M6281 Muscle weakness (generalized): Secondary | ICD-10-CM | POA: Diagnosis not present

## 2013-04-13 DIAGNOSIS — Z23 Encounter for immunization: Secondary | ICD-10-CM | POA: Diagnosis not present

## 2013-04-14 DIAGNOSIS — M6281 Muscle weakness (generalized): Secondary | ICD-10-CM | POA: Diagnosis not present

## 2013-04-14 DIAGNOSIS — M545 Low back pain, unspecified: Secondary | ICD-10-CM | POA: Diagnosis not present

## 2013-04-14 DIAGNOSIS — R262 Difficulty in walking, not elsewhere classified: Secondary | ICD-10-CM | POA: Diagnosis not present

## 2013-04-26 DIAGNOSIS — M545 Low back pain, unspecified: Secondary | ICD-10-CM | POA: Diagnosis not present

## 2013-04-26 DIAGNOSIS — M6281 Muscle weakness (generalized): Secondary | ICD-10-CM | POA: Diagnosis not present

## 2013-04-26 DIAGNOSIS — R262 Difficulty in walking, not elsewhere classified: Secondary | ICD-10-CM | POA: Diagnosis not present

## 2013-04-27 DIAGNOSIS — R972 Elevated prostate specific antigen [PSA]: Secondary | ICD-10-CM | POA: Diagnosis not present

## 2013-04-27 DIAGNOSIS — IMO0002 Reserved for concepts with insufficient information to code with codable children: Secondary | ICD-10-CM | POA: Diagnosis not present

## 2013-05-02 DIAGNOSIS — M545 Low back pain, unspecified: Secondary | ICD-10-CM | POA: Diagnosis not present

## 2013-05-02 DIAGNOSIS — R262 Difficulty in walking, not elsewhere classified: Secondary | ICD-10-CM | POA: Diagnosis not present

## 2013-05-02 DIAGNOSIS — M6281 Muscle weakness (generalized): Secondary | ICD-10-CM | POA: Diagnosis not present

## 2013-05-11 DIAGNOSIS — M545 Low back pain, unspecified: Secondary | ICD-10-CM | POA: Diagnosis not present

## 2013-05-11 DIAGNOSIS — R05 Cough: Secondary | ICD-10-CM | POA: Diagnosis not present

## 2013-05-11 DIAGNOSIS — R0982 Postnasal drip: Secondary | ICD-10-CM | POA: Diagnosis not present

## 2013-05-11 DIAGNOSIS — M6281 Muscle weakness (generalized): Secondary | ICD-10-CM | POA: Diagnosis not present

## 2013-05-11 DIAGNOSIS — J069 Acute upper respiratory infection, unspecified: Secondary | ICD-10-CM | POA: Diagnosis not present

## 2013-05-11 DIAGNOSIS — R262 Difficulty in walking, not elsewhere classified: Secondary | ICD-10-CM | POA: Diagnosis not present

## 2013-05-11 DIAGNOSIS — R059 Cough, unspecified: Secondary | ICD-10-CM | POA: Diagnosis not present

## 2013-05-23 DIAGNOSIS — R972 Elevated prostate specific antigen [PSA]: Secondary | ICD-10-CM | POA: Diagnosis not present

## 2013-05-25 DIAGNOSIS — M47817 Spondylosis without myelopathy or radiculopathy, lumbosacral region: Secondary | ICD-10-CM | POA: Diagnosis not present

## 2013-05-25 DIAGNOSIS — M5137 Other intervertebral disc degeneration, lumbosacral region: Secondary | ICD-10-CM | POA: Diagnosis not present

## 2013-05-25 DIAGNOSIS — M4310 Spondylolisthesis, site unspecified: Secondary | ICD-10-CM | POA: Diagnosis not present

## 2013-05-25 DIAGNOSIS — M5126 Other intervertebral disc displacement, lumbar region: Secondary | ICD-10-CM | POA: Diagnosis not present

## 2013-10-17 DIAGNOSIS — L819 Disorder of pigmentation, unspecified: Secondary | ICD-10-CM | POA: Diagnosis not present

## 2013-10-17 DIAGNOSIS — Z85820 Personal history of malignant melanoma of skin: Secondary | ICD-10-CM | POA: Diagnosis not present

## 2013-10-17 DIAGNOSIS — L821 Other seborrheic keratosis: Secondary | ICD-10-CM | POA: Diagnosis not present

## 2013-10-17 DIAGNOSIS — D235 Other benign neoplasm of skin of trunk: Secondary | ICD-10-CM | POA: Diagnosis not present

## 2013-12-21 DIAGNOSIS — G4730 Sleep apnea, unspecified: Secondary | ICD-10-CM | POA: Diagnosis not present

## 2013-12-21 DIAGNOSIS — Z1331 Encounter for screening for depression: Secondary | ICD-10-CM | POA: Diagnosis not present

## 2013-12-21 DIAGNOSIS — E782 Mixed hyperlipidemia: Secondary | ICD-10-CM | POA: Diagnosis not present

## 2013-12-21 DIAGNOSIS — R209 Unspecified disturbances of skin sensation: Secondary | ICD-10-CM | POA: Diagnosis not present

## 2013-12-21 DIAGNOSIS — E039 Hypothyroidism, unspecified: Secondary | ICD-10-CM | POA: Diagnosis not present

## 2013-12-21 DIAGNOSIS — R972 Elevated prostate specific antigen [PSA]: Secondary | ICD-10-CM | POA: Diagnosis not present

## 2013-12-21 DIAGNOSIS — R7309 Other abnormal glucose: Secondary | ICD-10-CM | POA: Diagnosis not present

## 2013-12-21 DIAGNOSIS — N400 Enlarged prostate without lower urinary tract symptoms: Secondary | ICD-10-CM | POA: Diagnosis not present

## 2013-12-21 DIAGNOSIS — Z0000 Encounter for general adult medical examination without abnormal findings: Secondary | ICD-10-CM | POA: Diagnosis not present

## 2013-12-21 DIAGNOSIS — Z23 Encounter for immunization: Secondary | ICD-10-CM | POA: Diagnosis not present

## 2013-12-21 DIAGNOSIS — C439 Malignant melanoma of skin, unspecified: Secondary | ICD-10-CM | POA: Diagnosis not present

## 2013-12-21 DIAGNOSIS — K219 Gastro-esophageal reflux disease without esophagitis: Secondary | ICD-10-CM | POA: Diagnosis not present

## 2014-03-01 DIAGNOSIS — H2510 Age-related nuclear cataract, unspecified eye: Secondary | ICD-10-CM | POA: Diagnosis not present

## 2014-04-14 DIAGNOSIS — Z23 Encounter for immunization: Secondary | ICD-10-CM | POA: Diagnosis not present

## 2014-04-17 DIAGNOSIS — R21 Rash and other nonspecific skin eruption: Secondary | ICD-10-CM | POA: Diagnosis not present

## 2014-04-17 DIAGNOSIS — Z08 Encounter for follow-up examination after completed treatment for malignant neoplasm: Secondary | ICD-10-CM | POA: Diagnosis not present

## 2014-04-17 DIAGNOSIS — L570 Actinic keratosis: Secondary | ICD-10-CM | POA: Diagnosis not present

## 2014-04-17 DIAGNOSIS — Z85820 Personal history of malignant melanoma of skin: Secondary | ICD-10-CM | POA: Diagnosis not present

## 2014-05-09 ENCOUNTER — Other Ambulatory Visit: Payer: Self-pay | Admitting: Gastroenterology

## 2014-05-24 DIAGNOSIS — R972 Elevated prostate specific antigen [PSA]: Secondary | ICD-10-CM | POA: Diagnosis not present

## 2014-05-25 DIAGNOSIS — J322 Chronic ethmoidal sinusitis: Secondary | ICD-10-CM | POA: Diagnosis not present

## 2014-05-25 DIAGNOSIS — H9312 Tinnitus, left ear: Secondary | ICD-10-CM | POA: Diagnosis not present

## 2014-05-25 DIAGNOSIS — H8141 Vertigo of central origin, right ear: Secondary | ICD-10-CM | POA: Diagnosis not present

## 2014-05-25 DIAGNOSIS — J320 Chronic maxillary sinusitis: Secondary | ICD-10-CM | POA: Diagnosis not present

## 2014-05-25 DIAGNOSIS — H9041 Sensorineural hearing loss, unilateral, right ear, with unrestricted hearing on the contralateral side: Secondary | ICD-10-CM | POA: Diagnosis not present

## 2014-05-25 DIAGNOSIS — H9122 Sudden idiopathic hearing loss, left ear: Secondary | ICD-10-CM | POA: Diagnosis not present

## 2014-05-25 DIAGNOSIS — H9311 Tinnitus, right ear: Secondary | ICD-10-CM | POA: Diagnosis not present

## 2014-06-14 DIAGNOSIS — H9041 Sensorineural hearing loss, unilateral, right ear, with unrestricted hearing on the contralateral side: Secondary | ICD-10-CM | POA: Diagnosis not present

## 2014-06-14 DIAGNOSIS — H9122 Sudden idiopathic hearing loss, left ear: Secondary | ICD-10-CM | POA: Diagnosis not present

## 2014-06-14 DIAGNOSIS — H9042 Sensorineural hearing loss, unilateral, left ear, with unrestricted hearing on the contralateral side: Secondary | ICD-10-CM | POA: Diagnosis not present

## 2014-06-14 DIAGNOSIS — H9312 Tinnitus, left ear: Secondary | ICD-10-CM | POA: Diagnosis not present

## 2014-06-18 DIAGNOSIS — R972 Elevated prostate specific antigen [PSA]: Secondary | ICD-10-CM | POA: Diagnosis not present

## 2014-06-18 DIAGNOSIS — E780 Pure hypercholesterolemia: Secondary | ICD-10-CM | POA: Diagnosis not present

## 2014-06-18 DIAGNOSIS — M25531 Pain in right wrist: Secondary | ICD-10-CM | POA: Diagnosis not present

## 2014-06-18 DIAGNOSIS — K219 Gastro-esophageal reflux disease without esophagitis: Secondary | ICD-10-CM | POA: Diagnosis not present

## 2014-06-18 DIAGNOSIS — R7309 Other abnormal glucose: Secondary | ICD-10-CM | POA: Diagnosis not present

## 2014-06-18 DIAGNOSIS — G4730 Sleep apnea, unspecified: Secondary | ICD-10-CM | POA: Diagnosis not present

## 2014-06-20 DIAGNOSIS — H903 Sensorineural hearing loss, bilateral: Secondary | ICD-10-CM | POA: Diagnosis not present

## 2014-06-21 ENCOUNTER — Other Ambulatory Visit: Payer: Self-pay | Admitting: Otolaryngology

## 2014-06-21 DIAGNOSIS — H905 Unspecified sensorineural hearing loss: Secondary | ICD-10-CM

## 2014-06-21 DIAGNOSIS — D333 Benign neoplasm of cranial nerves: Secondary | ICD-10-CM

## 2014-06-22 ENCOUNTER — Ambulatory Visit
Admission: RE | Admit: 2014-06-22 | Discharge: 2014-06-22 | Disposition: A | Discharge status: Home / Self Care | Payer: Medicare Other | Source: Ambulatory Visit | Attending: Otolaryngology | Admitting: Otolaryngology

## 2014-06-22 DIAGNOSIS — H905 Unspecified sensorineural hearing loss: Secondary | ICD-10-CM | POA: Diagnosis not present

## 2014-06-22 DIAGNOSIS — Z85820 Personal history of malignant melanoma of skin: Secondary | ICD-10-CM | POA: Diagnosis not present

## 2014-06-22 MED ORDER — GADOBENATE DIMEGLUMINE 529 MG/ML IV SOLN
20.0000 mL | Freq: Once | INTRAVENOUS | Status: AC | PRN
  Administered 2014-06-22: 20 mL via INTRAVENOUS

## 2014-06-29 ENCOUNTER — Other Ambulatory Visit: Payer: Medicare Other

## 2014-07-18 DIAGNOSIS — H903 Sensorineural hearing loss, bilateral: Secondary | ICD-10-CM | POA: Diagnosis not present

## 2014-07-24 DIAGNOSIS — H9042 Sensorineural hearing loss, unilateral, left ear, with unrestricted hearing on the contralateral side: Secondary | ICD-10-CM | POA: Diagnosis not present

## 2014-07-24 DIAGNOSIS — H903 Sensorineural hearing loss, bilateral: Secondary | ICD-10-CM | POA: Diagnosis not present

## 2014-07-31 DIAGNOSIS — H903 Sensorineural hearing loss, bilateral: Secondary | ICD-10-CM | POA: Diagnosis not present

## 2014-08-01 ENCOUNTER — Encounter (HOSPITAL_COMMUNITY): Payer: Self-pay | Admitting: *Deleted

## 2014-08-07 DIAGNOSIS — H903 Sensorineural hearing loss, bilateral: Secondary | ICD-10-CM | POA: Diagnosis not present

## 2014-08-10 ENCOUNTER — Other Ambulatory Visit: Payer: Self-pay | Admitting: Gastroenterology

## 2014-08-14 ENCOUNTER — Encounter (HOSPITAL_COMMUNITY)
Admission: RE | Disposition: A | Discharge status: Home / Self Care | Payer: Self-pay | Source: Ambulatory Visit | Attending: Gastroenterology

## 2014-08-14 ENCOUNTER — Other Ambulatory Visit: Payer: Self-pay | Admitting: Gastroenterology

## 2014-08-14 ENCOUNTER — Encounter (HOSPITAL_COMMUNITY): Payer: Self-pay

## 2014-08-14 ENCOUNTER — Ambulatory Visit (HOSPITAL_COMMUNITY)
Admission: RE | Admit: 2014-08-14 | Discharge: 2014-08-14 | Disposition: A | Discharge status: Home / Self Care | Payer: Medicare Other | Source: Ambulatory Visit | Attending: Gastroenterology | Admitting: Gastroenterology

## 2014-08-14 ENCOUNTER — Ambulatory Visit (HOSPITAL_COMMUNITY): Payer: Medicare Other | Admitting: Anesthesiology

## 2014-08-14 DIAGNOSIS — M1990 Unspecified osteoarthritis, unspecified site: Secondary | ICD-10-CM | POA: Diagnosis not present

## 2014-08-14 DIAGNOSIS — G4733 Obstructive sleep apnea (adult) (pediatric): Secondary | ICD-10-CM | POA: Diagnosis not present

## 2014-08-14 DIAGNOSIS — E780 Pure hypercholesterolemia: Secondary | ICD-10-CM | POA: Diagnosis not present

## 2014-08-14 DIAGNOSIS — Z1211 Encounter for screening for malignant neoplasm of colon: Secondary | ICD-10-CM | POA: Diagnosis not present

## 2014-08-14 DIAGNOSIS — Z85820 Personal history of malignant melanoma of skin: Secondary | ICD-10-CM | POA: Diagnosis not present

## 2014-08-14 DIAGNOSIS — K219 Gastro-esophageal reflux disease without esophagitis: Secondary | ICD-10-CM | POA: Diagnosis not present

## 2014-08-14 HISTORY — PX: COLONOSCOPY WITH PROPOFOL: SHX5780

## 2014-08-14 SURGERY — COLONOSCOPY WITH PROPOFOL
Anesthesia: Monitor Anesthesia Care

## 2014-08-14 MED ORDER — PROPOFOL 10 MG/ML IV BOLUS
INTRAVENOUS | Status: AC
  Filled 2014-08-14: qty 20

## 2014-08-14 MED ORDER — ONDANSETRON HCL 4 MG/2ML IJ SOLN
INTRAMUSCULAR | Status: DC | PRN
  Administered 2014-08-14: 4 mg via INTRAVENOUS

## 2014-08-14 MED ORDER — PROPOFOL INFUSION 10 MG/ML OPTIME
INTRAVENOUS | Status: DC | PRN
  Administered 2014-08-14: 300 ug/kg/min via INTRAVENOUS

## 2014-08-14 MED ORDER — KETAMINE HCL 10 MG/ML IJ SOLN
INTRAMUSCULAR | Status: DC | PRN
  Administered 2014-08-14: 20 mg via INTRAVENOUS

## 2014-08-14 MED ORDER — LIDOCAINE HCL (CARDIAC) 20 MG/ML IV SOLN
INTRAVENOUS | Status: AC
  Filled 2014-08-14: qty 5

## 2014-08-14 MED ORDER — ONDANSETRON HCL 4 MG/2ML IJ SOLN
INTRAMUSCULAR | Status: AC
  Filled 2014-08-14: qty 2

## 2014-08-14 MED ORDER — LIDOCAINE HCL (CARDIAC) 20 MG/ML IV SOLN
INTRAVENOUS | Status: DC | PRN
  Administered 2014-08-14: 100 mg via INTRAVENOUS

## 2014-08-14 MED ORDER — LACTATED RINGERS IV SOLN
INTRAVENOUS | Status: DC
  Administered 2014-08-14: 1000 mL via INTRAVENOUS

## 2014-08-14 SURGICAL SUPPLY — 21 items

## 2014-08-14 NOTE — Anesthesia Postprocedure Evaluation (Signed)
Anesthesia Post Note  Patient: Matthew Robinson  Procedure(s) Performed: Procedure(s) (LRB): COLONOSCOPY WITH PROPOFOL (N/A)  Anesthesia type: MAC  Patient location: PACU  Post pain: Pain level controlled  Post assessment: Post-op Vital signs reviewed  Last Vitals: BP 122/81 mmHg  Pulse 64  Temp(Src) 36.4 C (Oral)  Resp 16  Ht 6\' 5"  (1.956 m)  Wt 280 lb (127.007 kg)  BMI 33.20 kg/m2  SpO2 100%  Post vital signs: Reviewed  Level of consciousness: awake  Complications: No apparent anesthesia complications

## 2014-08-14 NOTE — H&P (Signed)
  Procedure: Screening colonoscopy. Normal screening colonoscopy performed on 08/16/2003  History: The patient is a 69 year old male born April 10, 1946. He is scheduled to undergo a repeat screening colonoscopy with polypectomy to prevent colon cancer.  Past medical history: Hypercholesterolemia. Gastroesophageal reflux. Obstructive sleep apnea syndrome. Allergic rhinitis. Mitral valve prolapse. Osteoarthritis. Superficial skin melanoma removed. Lumbar laminectomy. Left cataract surgery. Deviated septum surgery. Left retinal surgery.  Medication allergies: None  Exam: The patient is alert and lying comfortably on the endoscopy stretcher. Abdomen is soft and nontender to palpation. Lungs are clear to auscultation. Cardiac exam reveals a regular rhythm.  Plan: Proceed with screening colonoscopy

## 2014-08-14 NOTE — Op Note (Signed)
Procedure: Screening colonoscopy. Normal screening colonoscopy performed on 08/16/2003  Endoscopist: Earle Gell  Premedication: Propofol administered by anesthesia  Procedure: The patient was placed in the left lateral decubitus position. Anal inspection and digital rectal exam were normal. The Pentax pediatric colonoscope was introduced into the rectum and advanced to the cecum. A normal-appearing appendiceal orifice was identified. A normal-appearing ileocecal valve was intubated and the terminal ileum inspected. Colonic preparation for the exam today was good. Withdrawal time was 9 minutes  Rectum. Normal. Retroflexed view of the distal rectum normal  Sigmoid colon and descending colon. A few colonic diverticula were present.  Splenic flexure. Normal  Transverse colon. Normal  Hepatic flexure. Normal  Ascending colon. Normal  Cecum and ileocecal valve. Normal  Terminal ileum. Normal  Assessment: Normal screening colonoscopy

## 2014-08-14 NOTE — Discharge Instructions (Signed)

## 2014-08-14 NOTE — Transfer of Care (Signed)
Immediate Anesthesia Transfer of Care Note  Patient: Matthew Robinson  Procedure(s) Performed: Procedure(s) (LRB): COLONOSCOPY WITH PROPOFOL (N/A)  Patient Location: PACU  Anesthesia Type: MAC  Level of Consciousness: sedated, patient cooperative and responds to stimulation  Airway & Oxygen Therapy: Patient Spontanous Breathing and Patient connected to face mask oxgen  Post-op Assessment: Report given to PACU RN and Post -op Vital signs reviewed and stable  Post vital signs: Reviewed and stable  Complications: No apparent anesthesia complications

## 2014-08-14 NOTE — Anesthesia Preprocedure Evaluation (Signed)
Anesthesia Evaluation  Patient identified by MRN, date of birth, ID band Patient awake    Reviewed: Allergy & Precautions, H&P , NPO status , Patient's Chart, lab work & pertinent test results, reviewed documented beta blocker date and time   Airway Mallampati: II  TM Distance: >3 FB Neck ROM: full    Dental   Pulmonary sleep apnea ,  breath sounds clear to auscultation        Cardiovascular negative cardio ROS  Rhythm:regular     Neuro/Psych negative neurological ROS  negative psych ROS   GI/Hepatic negative GI ROS, Neg liver ROS,   Endo/Other  negative endocrine ROS  Renal/GU negative Renal ROS     Musculoskeletal   Abdominal   Peds  Hematology negative hematology ROS (+)   Anesthesia Other Findings See surgeon's H&P   Reproductive/Obstetrics negative OB ROS                             Anesthesia Physical  Anesthesia Plan  ASA: II  Anesthesia Plan: MAC   Post-op Pain Management:    Induction: Intravenous  Airway Management Planned:   Additional Equipment:   Intra-op Plan:   Post-operative Plan:   Informed Consent: I have reviewed the patients History and Physical, chart, labs and discussed the procedure including the risks, benefits and alternatives for the proposed anesthesia with the patient or authorized representative who has indicated his/her understanding and acceptance.   Dental Advisory Given  Plan Discussed with: CRNA and Surgeon  Anesthesia Plan Comments:         Anesthesia Quick Evaluation

## 2014-08-15 ENCOUNTER — Encounter (HOSPITAL_COMMUNITY): Payer: Self-pay | Admitting: Gastroenterology

## 2014-08-15 DIAGNOSIS — H903 Sensorineural hearing loss, bilateral: Secondary | ICD-10-CM | POA: Diagnosis not present

## 2014-09-17 DIAGNOSIS — H903 Sensorineural hearing loss, bilateral: Secondary | ICD-10-CM | POA: Diagnosis not present

## 2014-10-22 DIAGNOSIS — Z85820 Personal history of malignant melanoma of skin: Secondary | ICD-10-CM | POA: Diagnosis not present

## 2014-10-22 DIAGNOSIS — Z08 Encounter for follow-up examination after completed treatment for malignant neoplasm: Secondary | ICD-10-CM | POA: Diagnosis not present

## 2014-10-22 DIAGNOSIS — L814 Other melanin hyperpigmentation: Secondary | ICD-10-CM | POA: Diagnosis not present

## 2014-10-22 DIAGNOSIS — D225 Melanocytic nevi of trunk: Secondary | ICD-10-CM | POA: Diagnosis not present

## 2014-12-24 DIAGNOSIS — D485 Neoplasm of uncertain behavior of skin: Secondary | ICD-10-CM | POA: Diagnosis not present

## 2015-01-10 DIAGNOSIS — M1711 Unilateral primary osteoarthritis, right knee: Secondary | ICD-10-CM | POA: Diagnosis not present

## 2015-01-11 DIAGNOSIS — E780 Pure hypercholesterolemia: Secondary | ICD-10-CM | POA: Diagnosis not present

## 2015-01-11 DIAGNOSIS — R7309 Other abnormal glucose: Secondary | ICD-10-CM | POA: Diagnosis not present

## 2015-01-11 DIAGNOSIS — Z0000 Encounter for general adult medical examination without abnormal findings: Secondary | ICD-10-CM | POA: Diagnosis not present

## 2015-01-11 DIAGNOSIS — N400 Enlarged prostate without lower urinary tract symptoms: Secondary | ICD-10-CM | POA: Diagnosis not present

## 2015-01-11 DIAGNOSIS — I872 Venous insufficiency (chronic) (peripheral): Secondary | ICD-10-CM | POA: Diagnosis not present

## 2015-01-11 DIAGNOSIS — Z1389 Encounter for screening for other disorder: Secondary | ICD-10-CM | POA: Diagnosis not present

## 2015-01-11 DIAGNOSIS — R972 Elevated prostate specific antigen [PSA]: Secondary | ICD-10-CM | POA: Diagnosis not present

## 2015-01-11 DIAGNOSIS — G4730 Sleep apnea, unspecified: Secondary | ICD-10-CM | POA: Diagnosis not present

## 2015-01-11 DIAGNOSIS — E039 Hypothyroidism, unspecified: Secondary | ICD-10-CM | POA: Diagnosis not present

## 2015-01-11 DIAGNOSIS — J309 Allergic rhinitis, unspecified: Secondary | ICD-10-CM | POA: Diagnosis not present

## 2015-01-11 DIAGNOSIS — K219 Gastro-esophageal reflux disease without esophagitis: Secondary | ICD-10-CM | POA: Diagnosis not present

## 2015-01-11 DIAGNOSIS — C439 Malignant melanoma of skin, unspecified: Secondary | ICD-10-CM | POA: Diagnosis not present

## 2015-02-14 DIAGNOSIS — M1711 Unilateral primary osteoarthritis, right knee: Secondary | ICD-10-CM | POA: Diagnosis not present

## 2015-02-14 DIAGNOSIS — M7121 Synovial cyst of popliteal space [Baker], right knee: Secondary | ICD-10-CM | POA: Diagnosis not present

## 2015-02-14 DIAGNOSIS — M2391 Unspecified internal derangement of right knee: Secondary | ICD-10-CM | POA: Diagnosis not present

## 2015-02-14 DIAGNOSIS — M25561 Pain in right knee: Secondary | ICD-10-CM | POA: Diagnosis not present

## 2015-02-27 DIAGNOSIS — D696 Thrombocytopenia, unspecified: Secondary | ICD-10-CM | POA: Diagnosis not present

## 2015-02-27 DIAGNOSIS — G4733 Obstructive sleep apnea (adult) (pediatric): Secondary | ICD-10-CM | POA: Diagnosis not present

## 2015-02-27 DIAGNOSIS — E039 Hypothyroidism, unspecified: Secondary | ICD-10-CM | POA: Diagnosis not present

## 2015-02-27 DIAGNOSIS — D682 Hereditary deficiency of other clotting factors: Secondary | ICD-10-CM | POA: Diagnosis not present

## 2015-03-14 DIAGNOSIS — M2241 Chondromalacia patellae, right knee: Secondary | ICD-10-CM | POA: Diagnosis not present

## 2015-03-14 DIAGNOSIS — M7121 Synovial cyst of popliteal space [Baker], right knee: Secondary | ICD-10-CM | POA: Diagnosis not present

## 2015-03-14 DIAGNOSIS — M2391 Unspecified internal derangement of right knee: Secondary | ICD-10-CM | POA: Diagnosis not present

## 2015-03-14 DIAGNOSIS — M25561 Pain in right knee: Secondary | ICD-10-CM | POA: Diagnosis not present

## 2015-03-14 DIAGNOSIS — M1711 Unilateral primary osteoarthritis, right knee: Secondary | ICD-10-CM | POA: Diagnosis not present

## 2015-04-15 DIAGNOSIS — E039 Hypothyroidism, unspecified: Secondary | ICD-10-CM | POA: Diagnosis not present

## 2015-04-15 DIAGNOSIS — R7989 Other specified abnormal findings of blood chemistry: Secondary | ICD-10-CM | POA: Diagnosis not present

## 2015-04-22 DIAGNOSIS — L821 Other seborrheic keratosis: Secondary | ICD-10-CM | POA: Diagnosis not present

## 2015-04-22 DIAGNOSIS — L814 Other melanin hyperpigmentation: Secondary | ICD-10-CM | POA: Diagnosis not present

## 2015-04-22 DIAGNOSIS — Z08 Encounter for follow-up examination after completed treatment for malignant neoplasm: Secondary | ICD-10-CM | POA: Diagnosis not present

## 2015-04-22 DIAGNOSIS — Z23 Encounter for immunization: Secondary | ICD-10-CM | POA: Diagnosis not present

## 2015-04-22 DIAGNOSIS — Z85820 Personal history of malignant melanoma of skin: Secondary | ICD-10-CM | POA: Diagnosis not present

## 2015-05-13 DIAGNOSIS — T148 Other injury of unspecified body region: Secondary | ICD-10-CM | POA: Diagnosis not present

## 2015-05-14 DIAGNOSIS — H25811 Combined forms of age-related cataract, right eye: Secondary | ICD-10-CM | POA: Diagnosis not present

## 2015-05-14 DIAGNOSIS — H524 Presbyopia: Secondary | ICD-10-CM | POA: Diagnosis not present

## 2015-07-16 DIAGNOSIS — D696 Thrombocytopenia, unspecified: Secondary | ICD-10-CM | POA: Diagnosis not present

## 2015-07-16 DIAGNOSIS — G4730 Sleep apnea, unspecified: Secondary | ICD-10-CM | POA: Diagnosis not present

## 2015-07-16 DIAGNOSIS — K219 Gastro-esophageal reflux disease without esophagitis: Secondary | ICD-10-CM | POA: Diagnosis not present

## 2015-07-16 DIAGNOSIS — E7800 Pure hypercholesterolemia, unspecified: Secondary | ICD-10-CM | POA: Diagnosis not present

## 2015-07-16 DIAGNOSIS — E039 Hypothyroidism, unspecified: Secondary | ICD-10-CM | POA: Diagnosis not present

## 2015-07-16 DIAGNOSIS — J069 Acute upper respiratory infection, unspecified: Secondary | ICD-10-CM | POA: Diagnosis not present

## 2015-07-16 DIAGNOSIS — R7309 Other abnormal glucose: Secondary | ICD-10-CM | POA: Diagnosis not present

## 2015-10-23 DIAGNOSIS — L821 Other seborrheic keratosis: Secondary | ICD-10-CM | POA: Diagnosis not present

## 2015-10-23 DIAGNOSIS — D692 Other nonthrombocytopenic purpura: Secondary | ICD-10-CM | POA: Diagnosis not present

## 2015-10-23 DIAGNOSIS — L812 Freckles: Secondary | ICD-10-CM | POA: Diagnosis not present

## 2015-10-23 DIAGNOSIS — D690 Allergic purpura: Secondary | ICD-10-CM | POA: Diagnosis not present

## 2015-10-23 DIAGNOSIS — Z85820 Personal history of malignant melanoma of skin: Secondary | ICD-10-CM | POA: Diagnosis not present

## 2015-11-12 DIAGNOSIS — M5136 Other intervertebral disc degeneration, lumbar region: Secondary | ICD-10-CM | POA: Diagnosis not present

## 2015-11-12 DIAGNOSIS — M25551 Pain in right hip: Secondary | ICD-10-CM | POA: Diagnosis not present

## 2015-11-12 DIAGNOSIS — M25552 Pain in left hip: Secondary | ICD-10-CM | POA: Diagnosis not present

## 2015-11-12 DIAGNOSIS — Z981 Arthrodesis status: Secondary | ICD-10-CM | POA: Diagnosis not present

## 2015-11-12 DIAGNOSIS — M4726 Other spondylosis with radiculopathy, lumbar region: Secondary | ICD-10-CM | POA: Diagnosis not present

## 2015-11-12 DIAGNOSIS — M546 Pain in thoracic spine: Secondary | ICD-10-CM | POA: Diagnosis not present

## 2015-11-12 DIAGNOSIS — M549 Dorsalgia, unspecified: Secondary | ICD-10-CM | POA: Diagnosis not present

## 2015-11-21 DIAGNOSIS — H25811 Combined forms of age-related cataract, right eye: Secondary | ICD-10-CM | POA: Diagnosis not present

## 2015-11-21 DIAGNOSIS — H524 Presbyopia: Secondary | ICD-10-CM | POA: Diagnosis not present

## 2015-11-21 DIAGNOSIS — Z961 Presence of intraocular lens: Secondary | ICD-10-CM | POA: Diagnosis not present

## 2015-11-25 DIAGNOSIS — M7061 Trochanteric bursitis, right hip: Secondary | ICD-10-CM | POA: Diagnosis not present

## 2016-02-18 DIAGNOSIS — Z1159 Encounter for screening for other viral diseases: Secondary | ICD-10-CM | POA: Diagnosis not present

## 2016-02-18 DIAGNOSIS — I872 Venous insufficiency (chronic) (peripheral): Secondary | ICD-10-CM | POA: Diagnosis not present

## 2016-02-18 DIAGNOSIS — G4730 Sleep apnea, unspecified: Secondary | ICD-10-CM | POA: Diagnosis not present

## 2016-02-18 DIAGNOSIS — Z1389 Encounter for screening for other disorder: Secondary | ICD-10-CM | POA: Diagnosis not present

## 2016-02-18 DIAGNOSIS — D696 Thrombocytopenia, unspecified: Secondary | ICD-10-CM | POA: Diagnosis not present

## 2016-02-18 DIAGNOSIS — R972 Elevated prostate specific antigen [PSA]: Secondary | ICD-10-CM | POA: Diagnosis not present

## 2016-02-18 DIAGNOSIS — E7800 Pure hypercholesterolemia, unspecified: Secondary | ICD-10-CM | POA: Diagnosis not present

## 2016-02-18 DIAGNOSIS — E039 Hypothyroidism, unspecified: Secondary | ICD-10-CM | POA: Diagnosis not present

## 2016-02-18 DIAGNOSIS — R7303 Prediabetes: Secondary | ICD-10-CM | POA: Diagnosis not present

## 2016-02-18 DIAGNOSIS — R739 Hyperglycemia, unspecified: Secondary | ICD-10-CM | POA: Diagnosis not present

## 2016-02-18 DIAGNOSIS — E669 Obesity, unspecified: Secondary | ICD-10-CM | POA: Diagnosis not present

## 2016-02-18 DIAGNOSIS — N400 Enlarged prostate without lower urinary tract symptoms: Secondary | ICD-10-CM | POA: Diagnosis not present

## 2016-02-18 DIAGNOSIS — C439 Malignant melanoma of skin, unspecified: Secondary | ICD-10-CM | POA: Diagnosis not present

## 2016-02-18 DIAGNOSIS — Z0000 Encounter for general adult medical examination without abnormal findings: Secondary | ICD-10-CM | POA: Diagnosis not present

## 2016-02-18 DIAGNOSIS — Z6836 Body mass index (BMI) 36.0-36.9, adult: Secondary | ICD-10-CM | POA: Diagnosis not present

## 2016-03-19 ENCOUNTER — Other Ambulatory Visit: Payer: Self-pay

## 2016-04-11 DIAGNOSIS — Z23 Encounter for immunization: Secondary | ICD-10-CM | POA: Diagnosis not present

## 2016-04-27 DIAGNOSIS — L570 Actinic keratosis: Secondary | ICD-10-CM | POA: Diagnosis not present

## 2016-04-27 DIAGNOSIS — D235 Other benign neoplasm of skin of trunk: Secondary | ICD-10-CM | POA: Diagnosis not present

## 2016-04-27 DIAGNOSIS — Z85820 Personal history of malignant melanoma of skin: Secondary | ICD-10-CM | POA: Diagnosis not present

## 2016-04-27 DIAGNOSIS — L814 Other melanin hyperpigmentation: Secondary | ICD-10-CM | POA: Diagnosis not present

## 2016-04-27 DIAGNOSIS — L821 Other seborrheic keratosis: Secondary | ICD-10-CM | POA: Diagnosis not present

## 2016-05-28 DIAGNOSIS — H25811 Combined forms of age-related cataract, right eye: Secondary | ICD-10-CM | POA: Diagnosis not present

## 2016-05-28 DIAGNOSIS — Z961 Presence of intraocular lens: Secondary | ICD-10-CM | POA: Diagnosis not present

## 2016-05-28 DIAGNOSIS — H524 Presbyopia: Secondary | ICD-10-CM | POA: Diagnosis not present

## 2016-05-29 ENCOUNTER — Encounter: Payer: Self-pay | Admitting: Urgent Care

## 2016-05-29 ENCOUNTER — Inpatient Hospital Stay (HOSPITAL_COMMUNITY): Payer: Medicare Other

## 2016-05-29 ENCOUNTER — Ambulatory Visit (INDEPENDENT_AMBULATORY_CARE_PROVIDER_SITE_OTHER): Payer: Medicare Other | Admitting: Urgent Care

## 2016-05-29 ENCOUNTER — Other Ambulatory Visit: Payer: Self-pay

## 2016-05-29 ENCOUNTER — Ambulatory Visit (INDEPENDENT_AMBULATORY_CARE_PROVIDER_SITE_OTHER): Payer: Medicare Other

## 2016-05-29 ENCOUNTER — Inpatient Hospital Stay (HOSPITAL_COMMUNITY)
Admission: EM | Admit: 2016-05-29 | Discharge: 2016-06-08 | DRG: 871 | Disposition: A | Discharge status: Home Health | Payer: Medicare Other | Attending: Family Medicine | Admitting: Family Medicine

## 2016-05-29 ENCOUNTER — Encounter (HOSPITAL_COMMUNITY): Payer: Self-pay

## 2016-05-29 VITALS — BP 160/82 | HR 87 | Temp 98.1°F | Resp 18 | Ht 77.0 in | Wt 290.8 lb

## 2016-05-29 DIAGNOSIS — G9340 Encephalopathy, unspecified: Secondary | ICD-10-CM | POA: Diagnosis present

## 2016-05-29 DIAGNOSIS — G4733 Obstructive sleep apnea (adult) (pediatric): Secondary | ICD-10-CM

## 2016-05-29 DIAGNOSIS — Z85820 Personal history of malignant melanoma of skin: Secondary | ICD-10-CM

## 2016-05-29 DIAGNOSIS — R42 Dizziness and giddiness: Secondary | ICD-10-CM | POA: Diagnosis not present

## 2016-05-29 DIAGNOSIS — I5033 Acute on chronic diastolic (congestive) heart failure: Secondary | ICD-10-CM | POA: Diagnosis present

## 2016-05-29 DIAGNOSIS — L0390 Cellulitis, unspecified: Secondary | ICD-10-CM | POA: Diagnosis present

## 2016-05-29 DIAGNOSIS — Z452 Encounter for adjustment and management of vascular access device: Secondary | ICD-10-CM | POA: Diagnosis not present

## 2016-05-29 DIAGNOSIS — E872 Acidosis: Secondary | ICD-10-CM | POA: Diagnosis present

## 2016-05-29 DIAGNOSIS — A401 Sepsis due to streptococcus, group B: Secondary | ICD-10-CM | POA: Diagnosis present

## 2016-05-29 DIAGNOSIS — A408 Other streptococcal sepsis: Secondary | ICD-10-CM | POA: Diagnosis not present

## 2016-05-29 DIAGNOSIS — Z9842 Cataract extraction status, left eye: Secondary | ICD-10-CM | POA: Diagnosis not present

## 2016-05-29 DIAGNOSIS — I50812 Chronic right heart failure: Secondary | ICD-10-CM | POA: Diagnosis present

## 2016-05-29 DIAGNOSIS — M79609 Pain in unspecified limb: Secondary | ICD-10-CM | POA: Diagnosis not present

## 2016-05-29 DIAGNOSIS — R0602 Shortness of breath: Secondary | ICD-10-CM

## 2016-05-29 DIAGNOSIS — A419 Sepsis, unspecified organism: Secondary | ICD-10-CM | POA: Diagnosis not present

## 2016-05-29 DIAGNOSIS — E785 Hyperlipidemia, unspecified: Secondary | ICD-10-CM | POA: Diagnosis present

## 2016-05-29 DIAGNOSIS — Z7982 Long term (current) use of aspirin: Secondary | ICD-10-CM | POA: Diagnosis not present

## 2016-05-29 DIAGNOSIS — R609 Edema, unspecified: Secondary | ICD-10-CM | POA: Diagnosis not present

## 2016-05-29 DIAGNOSIS — I341 Nonrheumatic mitral (valve) prolapse: Secondary | ICD-10-CM | POA: Diagnosis present

## 2016-05-29 DIAGNOSIS — Z85828 Personal history of other malignant neoplasm of skin: Secondary | ICD-10-CM | POA: Diagnosis not present

## 2016-05-29 DIAGNOSIS — L03116 Cellulitis of left lower limb: Secondary | ICD-10-CM | POA: Diagnosis not present

## 2016-05-29 DIAGNOSIS — I5031 Acute diastolic (congestive) heart failure: Secondary | ICD-10-CM | POA: Diagnosis not present

## 2016-05-29 DIAGNOSIS — M7989 Other specified soft tissue disorders: Secondary | ICD-10-CM | POA: Diagnosis not present

## 2016-05-29 DIAGNOSIS — K219 Gastro-esophageal reflux disease without esophagitis: Secondary | ICD-10-CM | POA: Diagnosis present

## 2016-05-29 DIAGNOSIS — Z95828 Presence of other vascular implants and grafts: Secondary | ICD-10-CM | POA: Diagnosis not present

## 2016-05-29 DIAGNOSIS — R05 Cough: Secondary | ICD-10-CM | POA: Diagnosis present

## 2016-05-29 DIAGNOSIS — Z5181 Encounter for therapeutic drug level monitoring: Secondary | ICD-10-CM | POA: Diagnosis not present

## 2016-05-29 DIAGNOSIS — R600 Localized edema: Secondary | ICD-10-CM | POA: Diagnosis not present

## 2016-05-29 DIAGNOSIS — R7881 Bacteremia: Secondary | ICD-10-CM

## 2016-05-29 DIAGNOSIS — E039 Hypothyroidism, unspecified: Secondary | ICD-10-CM

## 2016-05-29 DIAGNOSIS — I420 Dilated cardiomyopathy: Secondary | ICD-10-CM | POA: Diagnosis present

## 2016-05-29 DIAGNOSIS — A409 Streptococcal sepsis, unspecified: Principal | ICD-10-CM | POA: Diagnosis present

## 2016-05-29 DIAGNOSIS — R0789 Other chest pain: Secondary | ICD-10-CM

## 2016-05-29 DIAGNOSIS — R059 Cough, unspecified: Secondary | ICD-10-CM

## 2016-05-29 DIAGNOSIS — B954 Other streptococcus as the cause of diseases classified elsewhere: Secondary | ICD-10-CM | POA: Diagnosis not present

## 2016-05-29 DIAGNOSIS — I5032 Chronic diastolic (congestive) heart failure: Secondary | ICD-10-CM | POA: Diagnosis not present

## 2016-05-29 DIAGNOSIS — B955 Unspecified streptococcus as the cause of diseases classified elsewhere: Secondary | ICD-10-CM | POA: Diagnosis present

## 2016-05-29 LAB — POCT CBC
Granulocyte percent: 85.3 %G — AB (ref 37–80)
HCT, POC: 46.7 % (ref 43.5–53.7)
HEMOGLOBIN: 16.1 g/dL (ref 14.1–18.1)
Lymph, poc: 1 (ref 0.6–3.4)
MCH: 31.5 pg — AB (ref 27–31.2)
MCHC: 34.5 g/dL (ref 31.8–35.4)
MCV: 91.2 fL (ref 80–97)
MID (cbc): 0.7 (ref 0–0.9)
MPV: 7.5 fL (ref 0–99.8)
PLATELET COUNT, POC: 146 10*3/uL (ref 142–424)
POC Granulocyte: 10.2 — AB (ref 2–6.9)
POC LYMPH PERCENT: 8.7 %L — AB (ref 10–50)
POC MID %: 6 %M (ref 0–12)
RBC: 5.12 M/uL (ref 4.69–6.13)
RDW, POC: 14.3 %
WBC: 11.9 10*3/uL — AB (ref 4.6–10.2)

## 2016-05-29 LAB — POCT URINALYSIS DIP (MANUAL ENTRY)
Bilirubin, UA: NEGATIVE
Glucose, UA: NEGATIVE
Ketones, POC UA: NEGATIVE
Leukocytes, UA: NEGATIVE
NITRITE UA: NEGATIVE
PH UA: 5.5
PROTEIN UA: NEGATIVE
Spec Grav, UA: 1.015
Urobilinogen, UA: 0.2

## 2016-05-29 LAB — CBC WITH DIFFERENTIAL/PLATELET
Basophils Absolute: 0 10*3/uL (ref 0.0–0.1)
Basophils Relative: 0 %
Eosinophils Absolute: 0 10*3/uL (ref 0.0–0.7)
Eosinophils Relative: 0 %
HEMATOCRIT: 44.6 % (ref 39.0–52.0)
HEMOGLOBIN: 15.2 g/dL (ref 13.0–17.0)
LYMPHS PCT: 7 %
Lymphs Abs: 0.9 10*3/uL (ref 0.7–4.0)
MCH: 31.7 pg (ref 26.0–34.0)
MCHC: 34.1 g/dL (ref 30.0–36.0)
MCV: 92.9 fL (ref 78.0–100.0)
MONO ABS: 0.5 10*3/uL (ref 0.1–1.0)
Monocytes Relative: 4 %
NEUTROS ABS: 10.2 10*3/uL — AB (ref 1.7–7.7)
Neutrophils Relative %: 89 %
Platelets: 138 10*3/uL — ABNORMAL LOW (ref 150–400)
RBC: 4.8 MIL/uL (ref 4.22–5.81)
RDW: 13.7 % (ref 11.5–15.5)
WBC: 11.5 10*3/uL — AB (ref 4.0–10.5)

## 2016-05-29 LAB — BASIC METABOLIC PANEL
Anion gap: 11 (ref 5–15)
BUN: 13 mg/dL (ref 6–20)
CHLORIDE: 102 mmol/L (ref 101–111)
CO2: 23 mmol/L (ref 22–32)
Calcium: 9.7 mg/dL (ref 8.9–10.3)
Creatinine, Ser: 0.99 mg/dL (ref 0.61–1.24)
GFR calc Af Amer: >60 mL/min (ref >60)
GFR calc non Af Amer: >60 mL/min (ref >60)
GLUCOSE: 134 mg/dL — AB (ref 65–99)
POTASSIUM: 3.9 mmol/L (ref 3.5–5.1)
Sodium: 136 mmol/L (ref 135–145)

## 2016-05-29 LAB — I-STAT TROPONIN, ED: Troponin i, poc: 0.01 ng/mL (ref 0.00–0.08)

## 2016-05-29 LAB — URINALYSIS, ROUTINE W REFLEX MICROSCOPIC
Bilirubin Urine: NEGATIVE
GLUCOSE, UA: NEGATIVE mg/dL
Hgb urine dipstick: NEGATIVE
Ketones, ur: NEGATIVE mg/dL
LEUKOCYTES UA: NEGATIVE
Nitrite: NEGATIVE
PROTEIN: NEGATIVE mg/dL
Specific Gravity, Urine: 1.024 (ref 1.005–1.030)
pH: 5 (ref 5.0–8.0)

## 2016-05-29 LAB — I-STAT CG4 LACTIC ACID, ED
Lactic Acid, Venous: 3 mmol/L (ref 0.5–1.9)
Lactic Acid, Venous: 2.94 mmol/L (ref 0.5–1.9)

## 2016-05-29 LAB — BRAIN NATRIURETIC PEPTIDE: B Natriuretic Peptide: 83.4 pg/mL (ref 0.0–100.0)

## 2016-05-29 MED ORDER — SODIUM CHLORIDE 0.9 % IV BOLUS (SEPSIS): 1000.0000 mL | Freq: Once | INTRAVENOUS | Status: DC

## 2016-05-29 MED ORDER — ENOXAPARIN SODIUM 80 MG/0.8ML ~~LOC~~ SOLN
65.0000 mg | SUBCUTANEOUS | Status: DC
  Administered 2016-05-30: 65 mg via SUBCUTANEOUS
  Filled 2016-05-29: qty 0.8

## 2016-05-29 MED ORDER — ONDANSETRON HCL 4 MG/2ML IJ SOLN
4.0000 mg | Freq: Once | INTRAMUSCULAR | Status: AC
  Administered 2016-05-29: 4 mg via INTRAVENOUS
  Filled 2016-05-29: qty 2

## 2016-05-29 MED ORDER — ACETAMINOPHEN 325 MG PO TABS
650.0000 mg | ORAL_TABLET | Freq: Once | ORAL | Status: AC
  Administered 2016-05-29: 650 mg via ORAL

## 2016-05-29 MED ORDER — VANCOMYCIN HCL IN DEXTROSE 1-5 GM/200ML-% IV SOLN: 1000.0000 mg | Freq: Once | INTRAVENOUS | Status: DC

## 2016-05-29 MED ORDER — ONDANSETRON HCL 4 MG PO TABS
4.0000 mg | ORAL_TABLET | Freq: Four times a day (QID) | ORAL | Status: DC | PRN
  Administered 2016-05-30: 4 mg via ORAL
  Filled 2016-05-29: qty 1

## 2016-05-29 MED ORDER — ACETAMINOPHEN 325 MG PO TABS
650.0000 mg | ORAL_TABLET | Freq: Four times a day (QID) | ORAL | Status: DC | PRN
  Administered 2016-05-30: 650 mg via ORAL
  Filled 2016-05-29: qty 2

## 2016-05-29 MED ORDER — VANCOMYCIN HCL 10 G IV SOLR
1250.0000 mg | Freq: Two times a day (BID) | INTRAVENOUS | Status: DC
  Administered 2016-05-30: 1250 mg via INTRAVENOUS
  Filled 2016-05-29 (×2): qty 1250

## 2016-05-29 MED ORDER — DOCUSATE SODIUM 100 MG PO CAPS
100.0000 mg | ORAL_CAPSULE | Freq: Two times a day (BID) | ORAL | Status: DC
  Administered 2016-05-30 – 2016-06-05 (×8): 100 mg via ORAL
  Filled 2016-05-29 (×14): qty 1

## 2016-05-29 MED ORDER — DEXTROSE 5 % IV SOLN
1.0000 g | Freq: Once | INTRAVENOUS | Status: AC
  Administered 2016-05-29: 1 g via INTRAVENOUS
  Filled 2016-05-29: qty 10

## 2016-05-29 MED ORDER — SODIUM CHLORIDE 0.9 % IV BOLUS (SEPSIS)
1000.0000 mL | Freq: Once | INTRAVENOUS | Status: AC
  Administered 2016-05-29: 1000 mL via INTRAVENOUS

## 2016-05-29 MED ORDER — ADULT MULTIVITAMIN W/MINERALS CH
1.0000 | ORAL_TABLET | ORAL | Status: DC
  Administered 2016-06-01 – 2016-06-08 (×3): 1 via ORAL
  Filled 2016-05-29 (×4): qty 1

## 2016-05-29 MED ORDER — OXYCODONE-ACETAMINOPHEN 5-325 MG PO TABS
1.0000 | ORAL_TABLET | Freq: Four times a day (QID) | ORAL | Status: DC | PRN
  Administered 2016-06-02: 1 via ORAL
  Filled 2016-05-29: qty 1

## 2016-05-29 MED ORDER — FAMOTIDINE 20 MG PO TABS: 20.0000 mg | ORAL_TABLET | Freq: Every day | ORAL | Status: DC | PRN

## 2016-05-29 MED ORDER — VANCOMYCIN HCL IN DEXTROSE 1-5 GM/200ML-% IV SOLN
1000.0000 mg | Freq: Once | INTRAVENOUS | Status: AC
  Administered 2016-05-29: 1000 mg via INTRAVENOUS
  Filled 2016-05-29: qty 200

## 2016-05-29 MED ORDER — ACETAMINOPHEN 325 MG PO TABS
ORAL_TABLET | ORAL | Status: AC
  Filled 2016-05-29: qty 2

## 2016-05-29 MED ORDER — PIPERACILLIN-TAZOBACTAM 3.375 G IVPB
3.3750 g | Freq: Three times a day (TID) | INTRAVENOUS | Status: DC
Start: 2016-05-30 — End: 2016-05-30
  Administered 2016-05-30: 3.375 g via INTRAVENOUS
  Filled 2016-05-29 (×3): qty 50

## 2016-05-29 MED ORDER — ACETAMINOPHEN 650 MG RE SUPP: 650.0000 mg | Freq: Four times a day (QID) | RECTAL | Status: DC | PRN

## 2016-05-29 MED ORDER — LEVOTHYROXINE SODIUM 50 MCG PO TABS
50.0000 ug | ORAL_TABLET | ORAL | Status: DC
  Administered 2016-05-30 – 2016-06-07 (×5): 50 ug via ORAL
  Filled 2016-05-29 (×7): qty 1

## 2016-05-29 MED ORDER — SIMVASTATIN 20 MG PO TABS
20.0000 mg | ORAL_TABLET | ORAL | Status: DC
  Administered 2016-06-01 – 2016-06-08 (×4): 20 mg via ORAL
  Filled 2016-05-29 (×4): qty 1

## 2016-05-29 MED ORDER — PIPERACILLIN-TAZOBACTAM 3.375 G IVPB 30 MIN
3.3750 g | Freq: Once | INTRAVENOUS | Status: AC
  Administered 2016-05-29: 3.375 g via INTRAVENOUS
  Filled 2016-05-29: qty 50

## 2016-05-29 MED ORDER — TRAZODONE HCL 50 MG PO TABS: 25.0000 mg | ORAL_TABLET | Freq: Every evening | ORAL | Status: DC | PRN

## 2016-05-29 MED ORDER — ONDANSETRON HCL 4 MG/2ML IJ SOLN
4.0000 mg | Freq: Four times a day (QID) | INTRAMUSCULAR | Status: DC | PRN
  Filled 2016-05-29: qty 2

## 2016-05-29 MED ORDER — LEVOTHYROXINE SODIUM 75 MCG PO TABS
75.0000 ug | ORAL_TABLET | ORAL | Status: DC
  Administered 2016-05-31 – 2016-06-08 (×5): 75 ug via ORAL
  Filled 2016-05-29 (×9): qty 1

## 2016-05-29 MED ORDER — VITAMIN D 1000 UNITS PO TABS
2000.0000 [IU] | ORAL_TABLET | ORAL | Status: DC
  Administered 2016-05-30 – 2016-06-07 (×5): 2000 [IU] via ORAL
  Filled 2016-05-29 (×5): qty 2

## 2016-05-29 MED ORDER — ASPIRIN EC 81 MG PO TBEC
81.0000 mg | DELAYED_RELEASE_TABLET | ORAL | Status: DC
Start: 2016-06-01 — End: 2016-06-08
  Administered 2016-06-01 – 2016-06-08 (×3): 81 mg via ORAL
  Filled 2016-05-29 (×3): qty 1

## 2016-05-29 NOTE — ED Triage Notes (Signed)
Pt. Had an episode of sob at home this afternoon, and took a lasix 20 mg.  He continued to have sob and his wife reports that his HR 120 and BP was 160/91  He went to urgent care [place] and the recommended that pt; come to [place] for a CT of the chest./  His labs were done at [facility] and chest x-ray also.  Pt. Felt nauseated earlier but not at present time.   Pt. Also reports that his sob is resolved and he denies any chest pain.  Pt. Does have peripheral edema and dizziness.  ECG completed in triage

## 2016-05-29 NOTE — ED Notes (Signed)
Elevated CG-4 of 3.00 reported to Dr. Billy Fischer on Devonne Doughty, Jolene Schimke in triage, and nurse first techs

## 2016-05-29 NOTE — Patient Instructions (Signed)
Please report to [facility] at [address] now.  Their phone number is [phone].      IF you received an x-ray today, you will receive an invoice from [facility]. Please contact [facility] at [phone] with questions or concerns regarding your invoice.   IF you received labwork today, you will receive an invoice from [employer]. Please contact [facility] at [phone] with questions or concerns regarding your invoice.   Our billing staff will not be able to assist you with questions regarding bills from these companies.  You will be contacted with the lab results as soon as they are available. The fastest way to get your results is to activate your My Chart account. Instructions are located on the last page of this paperwork. If you have not heard from [place] regarding the results in 2 weeks, please contact this office.

## 2016-05-29 NOTE — H&P (Signed)
History and Physical  Matthew Robinson [id number] DOB: 03-16-1946 DOA: 05/29/2016   PCP: Wenda Low, MD   Patient coming from: Home   Chief Complaint: Shortness of breath, left foot swelling   HPI: BARRE ACORS is a 70 y.o. male with medical history significant for hyperlipidemia, mitral valve prolapse who is being admitted to the hospital this evening due to sepsis from cellulitis. The patient is somewhat confused, history taken from the patient as well as his wife who is at the bedside in the ER this evening. It seems that over the last few days, the patient has had increasing swelling in his left lower extremity, this has caused what he perceived to be balance problems, and he has also been feeling somewhat short of breath the last few days. Today he went swimming, and according to his wife he says he was not able to swim as much as usual and when he came home he was behaving somewhat strangely and acting mildly confused. Due to his complaints of shortness of breath, and his confusion, his wife took him to urgent care from where he was referred to the emergency department. He denies having any chest pain, nausea, vomiting, fevers, chills. Denies any injury or cuts on his skin especially his lower cavity. He does have a history of peripheral edema for which she takes when necessary oral Lasix, but this first time that he has had unilateral edema.   ED Course: In the emergency department, he was found to have an elevated lactic acid, fever, leukocytosis, and evidence of left lower extremity cellulitis. He was started on IV vancomycin as well as IV Rocephin and given IV fluids. He was not hypotensive.  Review of Systems: Please see HPI for pertinent positives and negatives. A complete 10 system review of systems are otherwise negative.  Past Medical History:  Diagnosis Date  . Hyperlipemia   . 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 Mitral valve prolapse    predental antibiotics  .  Sleep apnea    cpap 16   Past Surgical History:  Procedure Laterality Date  . BACK SURGERY     4'14 lumbar fusion  . CATARACT EXTRACTION Left   . COLONOSCOPY     polyps in past  . COLONOSCOPY WITH PROPOFOL N/A 08/14/2014   Procedure: COLONOSCOPY WITH PROPOFOL;  Surgeon: Garlan Fair, MD;  Location: [facility];  Service: Endoscopy;  Laterality: N/A;  . EYE SURGERY     cataracts  . MELANOMA EXCISION    . MENISCUS REPAIR Left 1970  . RETINAL DETACHMENT SURGERY    . SPINE SURGERY     4'14 lumbar 3,4,5 fusion([place])  . TONSILLECTOMY      Social History:  reports that he has never smoked. He has never used smokeless tobacco. He reports that he drinks alcohol. He reports that he does not use drugs.   No Known Allergies  History reviewed. No pertinent family history.   Prior to Admission medications   Medication Sig Start Date End Date Taking? Authorizing Provider  aspirin EC 81 MG tablet Take 81 mg by mouth 2 (two) times a week.    Yes Historical Provider, MD  Cholecalciferol (VITAMIN D) 2000 UNITS tablet Take 2,000 Units by mouth every other day.    Yes Historical Provider, MD  famotidine (PEPCID) 20 MG tablet Take 20 mg by mouth daily as needed for heartburn.    Yes Historical Provider, MD  furosemide (LASIX) 20 MG tablet  Take 1 tablet (20 mg total) by mouth 2 (two) times daily. Patient taking differently: Take 20 mg by mouth daily as needed for fluid or edema.  10/28/12  Yes Cleatrice Burke, PA-C  levothyroxine (SYNTHROID, LEVOTHROID) 50 MCG tablet Take 50 mcg by mouth every other day. Alternates with 75 mcg strength   Yes Historical Provider, MD  levothyroxine (SYNTHROID, LEVOTHROID) 75 MCG tablet Take 75 mcg by mouth every other day. Alternates with 50 mcg strength   Yes Historical Provider, MD  Multiple Vitamin (MULTIVITAMIN WITH MINERALS) TABS Take 1 tablet by mouth 2 (two) times a week.    Yes Historical Provider, MD  Omega-3 Fatty Acids (FISH OIL) 1200 MG  CAPS Take 1,200 mg by mouth 2 (two) times a week.    Yes Historical Provider, MD  simvastatin (ZOCOR) 20 MG tablet Take 20 mg by mouth 3 (three) times a week.    Yes Historical Provider, MD  cyclobenzaprine (FLEXERIL) 10 MG tablet Take 1 tablet (10 mg total) by mouth 3 (three) times daily as needed for muscle spasms. Patient not taking: Reported on 05/29/2016 10/21/12   Jovita Gamma, MD  oxyCODONE-acetaminophen (PERCOCET/ROXICET) 5-325 MG per tablet Take 1-2 tablets by mouth every 4 (four) hours as needed for pain. Patient not taking: Reported on 05/29/2016 10/21/12   Jovita Gamma, MD    Physical Exam: BP 121/63   Pulse 88   Temp 101.2 F (38.4 C) (Oral)   Resp 14   Ht 6\' 5"  (1.956 m)   Wt 132 kg (291 lb)   SpO2 96%   BMI 34.51 kg/m   General:  Alert, orientedBut somewhat confused, calm, in no acute distress  Neck: supple, no masses, trachea mildline  Cardiovascular: RRR, no murmurs or rubs Respiratory: clear to auscultation bilaterally, no wheezes, no crackles  Abdomen: soft, nontender, nondistended, normal bowel tones heard  Skin: dry, he has an obvious warm, erythematous rash associated with edema in the left lower extremity. No areas of fluctuance, no drainage. He does have a large crack in the skin of his left heel. There is very minimal erythematous tracking up his left leg to just below the knee Musculoskeletal: no joint effusions, normal range of motion, he has nonpitting edema of his left lower extremity up to just above his ankle. Psychiatric: appropriate affect, normal speech  Neurologic: extraocular muscles intact, clear speech, moving all extremities with intact sensorium            Labs on Admission:  Basic Metabolic Panel:  Recent Labs Lab 05/29/16 1957  NA 136  K 3.9  CL 102  CO2 23  GLUCOSE 134*  BUN 13  CREATININE 0.99  CALCIUM 9.7   Liver Function Tests: No results for input(s): AST, ALT, ALKPHOS, BILITOT, PROT, ALBUMIN in the last 168  hours. No results for input(s): LIPASE, AMYLASE in the last 168 hours. No results for input(s): AMMONIA in the last 168 hours. CBC:  Recent Labs Lab 05/29/16 1641 05/29/16 1957  WBC 11.9* 11.5*  NEUTROABS  --  10.2*  HGB 16.1 15.2  HCT 46.7 44.6  MCV 91.2 92.9  PLT  --  138*   Cardiac Enzymes: No results for input(s): CKTOTAL, CKMB, CKMBINDEX, TROPONINI in the last 168 hours.  BNP (last 3 results)  Recent Labs  05/29/16 1854  BNP 83.4    ProBNP (last 3 results) No results for input(s): PROBNP in the last 8760 hours.  CBG: No results for input(s): GLUCAP in the last 168 hours.  Radiological  Exams on Admission: Dg Chest 2 View  Result Date: 05/29/2016 CLINICAL DATA:  shortness of breath, dizziness, peripheral edema EXAM: CHEST  2 VIEW COMPARISON:  Report from 10/08/1999 FINDINGS: Thoracic spondylosis. Upper normal heart size. The lungs appear clear. Atherosclerotic calcification of the aortic arch noted. No pleural effusion identified. IMPRESSION: 1.  No active cardiopulmonary disease is radiographically apparent. 2. Atherosclerotic aortic arch. Electronically Signed   By: Van Clines M.D.   On: 05/29/2016 16:25    Assessment/Plan Present on Admission: . Cellulitis This is a pleasant 70 year old relatively healthy male with a prior history of GERD, hypothyroidism who is being admitted to the hospital with left lower extremity cellulitis. He is also meeting sepsis criteria with leukocytosis, fever and lactic acidosis. -Inpatient admission -His received 1 L of IV fluids in the emergency department, will continue 6 mL/kg bolus -Recheck lactate in 3 hours -Empiric IV antibiotics including IV Zosyn and IV vancomycin -No evidence of deeper infection such as abscess or osteomyelitis -We will ask nursing staff to demarcate borders of erythema tonight  Principal Problem:   Sepsis ([place]) Active Problems:   Cellulitis   Hypothyroidism   GERD (gastroesophageal reflux  disease)   DVT prophylaxis: Lovenox   Code Status: Full code   Family Communication: Discussed with wife at the bedside this evening, all questions answered.   Disposition Plan: Likely to home in 2-3 days.   Consults called: None   Admission status: Inpatient   Time spent: 52 minutes  Mir Marry Guan MD [facility] Pager [phone]  If 7PM-7AM, please contact night-coverage [email or web address] Password [email or web address]  05/29/2016, 10:30 PM

## 2016-05-29 NOTE — Progress Notes (Signed)
MRN: [id number] DOB: [birth year]  Subjective:   Matthew Robinson is a 70 y.o. male presenting for chief complaint of Shortness of Breath  Reports sudden onset of shob this morning at 10am. Patient was not performing strenuous activity. Has had consistent shob since then up to now. Associated symptoms of dizziness, malaise, lower leg edema. He usually takes Lasix for his lower leg edema without any problems. Denies chest pain, heart racing, palpitations, n/v, abdominal pain, neck pain, jaw pain, limb pain, diaphoresis. He actually went swimming later in the day. His BP measurements at home were elevated, 99991111 systolic, normally runs in the 120's. He does not have a cardiologist but admits a history of mitral valve prolapse. Denies smoking cigarettes.   [name] has a current medication list which includes the following prescription(s): aspirin ec, vitamin d, coenzyme q10, famotidine, furosemide, levothyroxine, multivitamin with minerals, fish oil, simvastatin, cyclobenzaprine, furosemide, and oxycodone-acetaminophen. Also has No Known Allergies.  [name]  has a past medical history of Hyperlipemia; Melanoma of skin; Mitral valve prolapse; and Sleep apnea. Also  has a past surgical history that includes Melanoma excision; Retinal detachment surgery; Eye surgery; Colonoscopy; Meniscus repair (Left, 1970); Spine surgery; Cataract extraction (Left); Tonsillectomy; Back surgery; and Colonoscopy with propofol (N/A, 08/14/2014).  Objective:   Vitals: BP (!) 160/82   Pulse 87   Temp 98.1 F (36.7 C) (Oral)   Resp 18   Ht 6\' 5"  (1.956 m)   Wt 290 lb 12.8 oz (131.9 kg)   SpO2 97%   BMI 34.48 kg/m   Physical Exam  Constitutional: He is oriented to person, place, and time. He appears well-developed and well-nourished.  HENT:  Mouth/Throat: Oropharynx is clear and moist.  Eyes: No scleral icterus.  Neck: Normal range of motion. Neck supple. No thyromegaly present.  Cardiovascular: Normal rate, regular  rhythm and intact distal pulses.  Exam reveals no gallop and no friction rub.   No murmur heard. Pulmonary/Chest: No respiratory distress. He has no wheezes. He has no rales.  Abdominal: Soft. Bowel sounds are normal. He exhibits no distension and no mass. There is no tenderness. There is no guarding.  Neurological: He is alert and oriented to person, place, and time.  Skin: Skin is warm and dry.   Dg Chest 2 View  Result Date: 05/29/2016 CLINICAL DATA:  shortness of breath, dizziness, peripheral edema EXAM: CHEST  2 VIEW COMPARISON:  Report from 10/08/1999 FINDINGS: Thoracic spondylosis. Upper normal heart size. The lungs appear clear. Atherosclerotic calcification of the aortic arch noted. No pleural effusion identified. IMPRESSION: 1.  No active cardiopulmonary disease is radiographically apparent. 2. Atherosclerotic aortic arch. Electronically Signed   By: Van Clines M.D.   On: 05/29/2016 16:25   Results for orders placed or performed in visit on 05/29/16 (from the past 24 hour(s))  POCT urinalysis dipstick     Status: Abnormal   Collection Time: 05/29/16  4:27 PM  Result Value Ref Range   Color, UA yellow yellow   Clarity, UA clear clear   Glucose, UA negative negative   Bilirubin, UA negative negative   Ketones, POC UA negative negative   Spec Grav, UA 1.015    Blood, UA small (A) negative   pH, UA 5.5    Protein Ur, POC negative negative   Urobilinogen, UA 0.2    Nitrite, UA Negative Negative   Leukocytes, UA Negative Negative  POCT CBC     Status: Abnormal   Collection Time: 05/29/16  4:41 PM  Result Value Ref Range   WBC 11.9 (A) 4.6 - 10.2 K/uL   Lymph, poc 1.0 0.6 - 3.4   POC LYMPH PERCENT 8.7 (A) 10 - 50 %L   MID (cbc) 0.7 0 - 0.9   POC MID % 6.0 0 - 12 %M   POC Granulocyte 10.2 (A) 2 - 6.9   Granulocyte percent 85.3 (A) 37 - 80 %G   RBC 5.12 4.69 - 6.13 M/uL   Hemoglobin 16.1 14.1 - 18.1 g/dL   HCT, POC 46.7 43.5 - 53.7 %   MCV 91.2 80 - 97 fL   MCH, POC  31.5 (A) 27 - 31.2 pg   MCHC 34.5 31.8 - 35.4 g/dL   RDW, POC 14.3 %   Platelet Count, POC 146 142 - 424 K/uL   MPV 7.5 0 - 99.8 fL   Assessment and Plan :   This case was precepted with Dr. Tamala Julian.   1. Other chest pain 2. Shortness of breath 3. Peripheral edema 4. Dizziness - Will send patient via EMS for further work up including rule out of PE, NSTEMI, heart failure versus other cardiopulmonary diagnosis. Report given to charge nurse at [facility].  5. Hypothyroidism, unspecified type - Thyroid Panel With TSH pending  6. Obstructive sleep apnea - Stable, continue using cpap.  Jaynee Eagles, PA-C [facility] [phone] 05/29/2016 4:01 PM

## 2016-05-29 NOTE — ED Provider Notes (Signed)
[facility] DEPT Provider Note   CSN: [id number] Arrival date & time: 05/29/16  1821     History   Chief Complaint Chief Complaint  Patient presents with  . Shortness of Breath    HPI Matthew Robinson is a 70 y.o. male.  The history is provided by the patient and medical records. No language interpreter was used.  Shortness of Breath  Associated symptoms include a fever and leg swelling. Pertinent negatives include no headaches, no neck pain, no cough, no chest pain, no vomiting and no abdominal pain.   Matthew Robinson is a 70 y.o. male  with a PMH of HLD who presents to the Emergency Department complaining of shortness of breath that began earlier today at approx. 10 am while swimming. Patient states that he typically swims daily, but became short of breath much sooner than usual today. He also endorses associated bilateral lower extremity swelling L>R. He states that his left foot typically does swell up more than the right and will occasionally be red as well. He states that when he gets short of breath easier than usual or has lower extremity swelling he will take a lasix. His PCP told him to take lasix PRN swelling which is typically 3-4x per month. Seen by urgent care earlier today and encouraged to come to ER for further evaluation. He denies feeling feverish and was febrile at urgent care, however temperature was 101.2 in triage. He denies associated chest pain, palpitations, neck pain, jaw pain, nausea, vomiting, abdominal pain.   Past Medical History:  Diagnosis Date  . Hyperlipemia   . Melanoma of skin ([place])    left shoulder  . Mitral valve prolapse    predental antibiotics  . Sleep apnea    cpap 16    There are no active problems to display for this patient.   Past Surgical History:  Procedure Laterality Date  . BACK SURGERY     4'14 lumbar fusion  . CATARACT EXTRACTION Left   . COLONOSCOPY     polyps in past  . COLONOSCOPY WITH PROPOFOL N/A 08/14/2014   Procedure: COLONOSCOPY WITH PROPOFOL;  Surgeon: Garlan Fair, MD;  Location: [facility];  Service: Endoscopy;  Laterality: N/A;  . EYE SURGERY     cataracts  . MELANOMA EXCISION    . MENISCUS REPAIR Left 1970  . RETINAL DETACHMENT SURGERY    . SPINE SURGERY     4'14 lumbar 3,4,5 fusion([place])  . TONSILLECTOMY        Home Medications    Prior to Admission medications   Medication Sig Start Date End Date Taking? Authorizing Provider  aspirin EC 81 MG tablet Take 162 mg by mouth every other day.     Historical Provider, MD  Cholecalciferol (VITAMIN D) 2000 UNITS tablet Take 2,000 Units by mouth every other day.     Historical Provider, MD  Coenzyme Q10 (COQ10 PO) Take 1 capsule by mouth every other day.    Historical Provider, MD  cyclobenzaprine (FLEXERIL) 10 MG tablet Take 1 tablet (10 mg total) by mouth 3 (three) times daily as needed for muscle spasms. Patient not taking: Reported on 05/29/2016 10/21/12   Jovita Gamma, MD  famotidine (PEPCID) 20 MG tablet Take 20 mg by mouth daily as needed for heartburn.     Historical Provider, MD  furosemide (LASIX) 20 MG tablet Take 1 tablet (20 mg total) by mouth 2 (two) times daily. Patient not taking: Reported on 05/29/2016 10/28/12   Jarrett Soho  [place], PA-C  furosemide (LASIX) 40 MG tablet Take 40 mg by mouth daily as needed (for swelling).    Historical Provider, MD  levothyroxine (SYNTHROID, LEVOTHROID) 50 MCG tablet Take 50 mcg by mouth daily before breakfast. Alternate with 75 mcg    Historical Provider, MD  Multiple Vitamin (MULTIVITAMIN WITH MINERALS) TABS Take 1 tablet by mouth every other day.     Historical Provider, MD  Omega-3 Fatty Acids (FISH OIL) 1200 MG CAPS Take 1,200 mg by mouth every other day.     Historical Provider, MD  oxyCODONE-acetaminophen (PERCOCET/ROXICET) 5-325 MG per tablet Take 1-2 tablets by mouth every 4 (four) hours as needed for pain. Patient not taking: Reported on 05/29/2016 10/21/12    Jovita Gamma, MD  simvastatin (ZOCOR) 20 MG tablet Take 20 mg by mouth every other day.    Historical Provider, MD    Family History History reviewed. No pertinent family history.  Social History Social History  Substance Use Topics  . Smoking status: Never Smoker  . Smokeless tobacco: Never Used  . Alcohol use 0.0 oz/week    1 - 3 Glasses of wine per week     Comment: x2 weekly      Allergies   Patient has no known allergies.   Review of Systems Review of Systems  Constitutional: Positive for fever. Negative for chills and diaphoresis.  HENT: Negative for congestion.   Eyes: Negative for visual disturbance.  Respiratory: Positive for shortness of breath. Negative for cough.   Cardiovascular: Positive for leg swelling. Negative for chest pain and palpitations.  Gastrointestinal: Negative for abdominal pain, nausea and vomiting.  Genitourinary: Negative for dysuria.  Musculoskeletal: Negative for back pain and neck pain.  Skin: Positive for color change.  Neurological: Negative for headaches.     Physical Exam Updated Vital Signs BP 104/78   Pulse 74   Temp 101.2 F (38.4 C) (Oral)   Resp 17   Ht 6\' 5"  (1.956 m)   Wt 132 kg   SpO2 100%   BMI 34.51 kg/m   Physical Exam  Constitutional: He is oriented to person, place, and time. He appears well-developed and well-nourished. No distress.  HENT:  Head: Normocephalic and atraumatic.  Cardiovascular: Normal rate, regular rhythm, normal heart sounds and intact distal pulses.   No murmur heard. Pulmonary/Chest: Effort normal and breath sounds normal. No respiratory distress. He has no wheezes. He has no rales. He exhibits no tenderness.  Abdominal: Soft. He exhibits no distension. There is no tenderness.  Musculoskeletal: He exhibits edema.  Bilateral lower extremity edema L>R.  Bilateral LE's with full ROM and no tenderness to palpation. Left heel with crack in the skin - left lateral ankle with erythema and  warmth to the touch. No focal area of fluctuance.   Neurological: He is alert and oriented to person, place, and time.  Skin: Skin is warm and dry.  Nursing note and vitals reviewed.    ED Treatments / Results  Labs (all labs ordered are listed, but only abnormal results are displayed) Labs Reviewed  CBC WITH DIFFERENTIAL/PLATELET - Abnormal; Notable for the following:       Result Value   WBC 11.5 (*)    Platelets 138 (*)    Neutro Abs 10.2 (*)    All other components within normal limits  I-STAT CG4 LACTIC ACID, ED - Abnormal; Notable for the following:    Lactic Acid, Venous 3.00 (*)    All other components within normal limits  URINE CULTURE  CULTURE, BLOOD (ROUTINE X 2)  CULTURE, BLOOD (ROUTINE X 2)  BRAIN NATRIURETIC PEPTIDE  URINALYSIS, ROUTINE W REFLEX MICROSCOPIC (NOT AT [facility])  BASIC METABOLIC PANEL  I-STAT TROPOININ, ED    EKG  EKG Interpretation  Date/Time:  Friday May 29 2016 19:23:45 EST Ventricular Rate:  76 PR Interval:    QRS Duration: 101 QT Interval:  361 QTC Calculation: 406 R Axis:   63 Text Interpretation:  Sinus rhythm RSR' in V1 or V2, right VCD or RVH No significant change was found Confirmed by CAMPOS  MD, Lennette Bihari ([id number]) on 05/29/2016 7:28:15 PM       Radiology Dg Chest 2 View  Result Date: 05/29/2016 CLINICAL DATA:  shortness of breath, dizziness, peripheral edema EXAM: CHEST  2 VIEW COMPARISON:  Report from 10/08/1999 FINDINGS: Thoracic spondylosis. Upper normal heart size. The lungs appear clear. Atherosclerotic calcification of the aortic arch noted. No pleural effusion identified. IMPRESSION: 1.  No active cardiopulmonary disease is radiographically apparent. 2. Atherosclerotic aortic arch. Electronically Signed   By: Van Clines M.D.   On: 05/29/2016 16:25    Procedures Procedures (including critical care time)  Medications Ordered in ED Medications  acetaminophen (TYLENOL) 325 MG tablet (not administered)  cefTRIAXone  (ROCEPHIN) 1 g in dextrose 5 % 50 mL IVPB (not administered)  vancomycin (VANCOCIN) IVPB 1000 mg/200 mL premix (1,000 mg Intravenous New Bag/Given 05/29/16 2016)  acetaminophen (TYLENOL) tablet 650 mg (650 mg Oral Given 05/29/16 1904)  sodium chloride 0.9 % bolus 1,000 mL (1,000 mLs Intravenous New Bag/Given 05/29/16 2016)     Initial Impression / Assessment and Plan / ED Course  I have reviewed the triage vital signs and the nursing notes.  Pertinent labs & imaging results that were available during my care of the patient were reviewed by me and considered in my medical decision making (see chart for details).  Clinical Course    Matthew Robinson is a 70 y.o. male who presents to ED for lower extremity swelling and shortness of breath. On exam, patient is febrile 101.2. Left ankle is swollen, erythematous and warm to the touch. Urine and cxr performed at urgent care earlier today and reassuring. Lactic of 3.0. White count 11.5 with shift of 10.2. Blood cultures were obtained. Patient started on vanc and rocephin.   BMP pending at shift change. Care assumed by attending, Dr. Venora Maples. Likely admission.    Final Clinical Impressions(s) / ED Diagnoses   Final diagnoses:  None    New Prescriptions New Prescriptions   No medications on file     [place], PA-C 05/29/16 2017    Jola Schmidt, MD 05/29/16 2145

## 2016-05-29 NOTE — ED Notes (Signed)
vanc was started after the blood cultures were drawn

## 2016-05-29 NOTE — Progress Notes (Signed)
ANTIBIOTIC CONSULT NOTE - INITIAL  Pharmacy Consult for Vancomycin Indication: cellulitis  No Known Allergies  Patient Measurements: Height: 6\' 5"  (195.6 cm) Weight: 291 lb (132 kg) IBW/kg (Calculated) : 89.1 Adjusted Body Weight:   Vital Signs: Temp: 101.2 F (38.4 C) (11/17 1959) Temp Source: Oral (11/17 1959) BP: 104/78 (11/17 2015) Pulse Rate: 74 (11/17 2015) Intake/Output from previous day: No intake/output data recorded. Intake/Output from this shift: No intake/output data recorded.  Labs:  Recent Labs  05/29/16 1641 05/29/16 1957  WBC 11.9* 11.5*  HGB 16.1 15.2  PLT  --  138*  CREATININE  --  0.99   Estimated Creatinine Clearance: 101.4 mL/min (by C-G formula based on SCr of 0.99 mg/dL). No results for input(s): VANCOTROUGH, VANCOPEAK, VANCORANDOM, GENTTROUGH, GENTPEAK, GENTRANDOM, TOBRATROUGH, TOBRAPEAK, TOBRARND, AMIKACINPEAK, AMIKACINTROU, AMIKACIN in the last 72 hours.   Microbiology: No results found for this or any previous visit (from the past 720 hour(s)).  Medical History: Past Medical History:  Diagnosis Date  . Hyperlipemia   . Melanoma of skin ([place])    left shoulder  . Mitral valve prolapse    predental antibiotics  . Sleep apnea    cpap 16    Medications:  F/u med rec  Assessment: SOB, dizziness, malaise, LE edema. Admit to r/o PE, NSTEMI, heart failure versus other cardiopulmonary diagnosis. Start Vancomycin for cellulitis in ED.  Admit labs: WBC 11.5, Hgb 15.2, Plts 138,  Scr 0.99  Goal of Therapy:  Vancomycin trough level 10-15 mcg/ml  Plan:  Vancomycin 2g IV x 1 in ED Vancomycin 1250mg  IV q12h. Vancomycin trough after 3-5 doses at steady state   Montford Barg S. Alford [place], PharmD, BCPS Clinical Staff Pharmacist Pager [phone]  [place], [place] 05/29/2016,8:41 PM

## 2016-05-29 NOTE — Progress Notes (Signed)
Pharmacy Antibiotic Note JAHQUEZ ELTER is a 70 y.o. male admitted on 05/29/2016 with cellulitis.  Pharmacy has been consulted for Zosyn and vancomycin dosing.  Plan: 1. Vancomycin 1250 IV every 12 hours.  Goal trough 10-15 mcg/mL. 2. Zosyn 3.375g IV q8h (4 hour infusion).  3. SCr every 72 hours while on vancomycin 4. Consider de escalation from Zosyn to either monotherapy with high dose Ancef or Ceftriaxone with vancomycin in the absence of risk factor for pseudomonas   Height: 6\' 5"  (195.6 cm) Weight: 289 lb (131.1 kg) IBW/kg (Calculated) : 89.1  Temp (24hrs), Avg:100.9 F (38.3 C), Min:98.1 F (36.7 C), Max:102.7 F (39.3 C)   Recent Labs Lab 05/29/16 1641 05/29/16 1900 05/29/16 1957 05/29/16 2159  WBC 11.9*  --  11.5*  --   CREATININE  --   --  0.99  --   LATICACIDVEN  --  3.00*  --  2.94*    Estimated Creatinine Clearance: 101 mL/min (by C-G formula based on SCr of 0.99 mg/dL).    No Known Allergies  Antimicrobials this admission:   11/17 Zosyn >>   11/17 vancomycin  >>  11/17 Ceftriaxone x 1  Dose adjustments this admission:  N/a  Microbiology results:  11/17 BCx: px 11/17 UCx: px   Thank you for allowing pharmacy to be a part of this patient's care.  Vincenza Hews, PharmD, BCPS 05/29/2016, 11:49 PM Pager: [phone]

## 2016-05-30 DIAGNOSIS — E039 Hypothyroidism, unspecified: Secondary | ICD-10-CM

## 2016-05-30 LAB — BLOOD CULTURE ID PANEL (REFLEXED)
ACINETOBACTER BAUMANNII: NOT DETECTED
CANDIDA ALBICANS: NOT DETECTED
CANDIDA PARAPSILOSIS: NOT DETECTED
CANDIDA TROPICALIS: NOT DETECTED
Candida glabrata: NOT DETECTED
Candida krusei: NOT DETECTED
Enterobacter cloacae complex: NOT DETECTED
Enterobacteriaceae species: NOT DETECTED
Enterococcus species: NOT DETECTED
Escherichia coli: NOT DETECTED
HAEMOPHILUS INFLUENZAE: NOT DETECTED
KLEBSIELLA OXYTOCA: NOT DETECTED
KLEBSIELLA PNEUMONIAE: NOT DETECTED
Listeria monocytogenes: NOT DETECTED
Neisseria meningitidis: NOT DETECTED
PROTEUS SPECIES: NOT DETECTED
Pseudomonas aeruginosa: NOT DETECTED
SERRATIA MARCESCENS: NOT DETECTED
STAPHYLOCOCCUS AUREUS BCID: NOT DETECTED
STAPHYLOCOCCUS SPECIES: NOT DETECTED
STREPTOCOCCUS SPECIES: DETECTED — AB
Streptococcus agalactiae: NOT DETECTED
Streptococcus pneumoniae: NOT DETECTED
Streptococcus pyogenes: NOT DETECTED

## 2016-05-30 LAB — BASIC METABOLIC PANEL
ANION GAP: 9 (ref 5–15)
BUN: 16 mg/dL (ref 7–25)
BUN: 13 mg/dL (ref 6–20)
CO2: 24 mmol/L (ref 20–31)
CO2: 24 mmol/L (ref 22–32)
Calcium: 9.9 mg/dL (ref 8.6–10.3)
Calcium: 8.4 mg/dL — ABNORMAL LOW (ref 8.9–10.3)
Chloride: 103 mmol/L (ref 98–110)
Chloride: 103 mmol/L (ref 101–111)
Creat: 0.74 mg/dL (ref 0.70–1.18)
Creatinine, Ser: 1.15 mg/dL (ref 0.61–1.24)
GFR calc Af Amer: >60 mL/min (ref >60)
GFR calc non Af Amer: >60 mL/min (ref >60)
GLUCOSE: 144 mg/dL — AB (ref 65–99)
Glucose, Bld: 127 mg/dL — ABNORMAL HIGH (ref 65–99)
POTASSIUM: 4.1 mmol/L (ref 3.5–5.3)
POTASSIUM: 4.1 mmol/L (ref 3.5–5.1)
SODIUM: 139 mmol/L (ref 135–146)
Sodium: 136 mmol/L (ref 135–145)

## 2016-05-30 LAB — CBC
HEMATOCRIT: 42.9 % (ref 39.0–52.0)
HEMOGLOBIN: 14 g/dL (ref 13.0–17.0)
MCH: 30.6 pg (ref 26.0–34.0)
MCHC: 32.6 g/dL (ref 30.0–36.0)
MCV: 93.9 fL (ref 78.0–100.0)
Platelets: 131 10*3/uL — ABNORMAL LOW (ref 150–400)
RBC: 4.57 MIL/uL (ref 4.22–5.81)
RDW: 14.2 % (ref 11.5–15.5)
WBC: 11.4 10*3/uL — ABNORMAL HIGH (ref 4.0–10.5)

## 2016-05-30 LAB — THYROID PANEL WITH TSH
Free Thyroxine Index: 2.5 (ref 1.4–3.8)
T3 UPTAKE: 32 % (ref 22–35)
T4 TOTAL: 7.7 ug/dL (ref 4.5–12.0)
TSH: 1.9 m[IU]/L (ref 0.40–4.50)

## 2016-05-30 LAB — LACTIC ACID, PLASMA
LACTIC ACID, VENOUS: 2.3 mmol/L — AB (ref 0.5–1.9)
Lactic Acid, Venous: 2.2 mmol/L (ref 0.5–1.9)

## 2016-05-30 LAB — BRAIN NATRIURETIC PEPTIDE: Brain Natriuretic Peptide: 56.2 pg/mL (ref <100)

## 2016-05-30 MED ORDER — DEXTROSE 5 % IV SOLN
2.0000 g | INTRAVENOUS | Status: DC
  Administered 2016-05-30 – 2016-06-01 (×3): 2 g via INTRAVENOUS
  Filled 2016-05-30 (×4): qty 2

## 2016-05-30 MED ORDER — ENOXAPARIN SODIUM 80 MG/0.8ML ~~LOC~~ SOLN
65.0000 mg | SUBCUTANEOUS | Status: DC
  Administered 2016-05-31 – 2016-06-07 (×8): 65 mg via SUBCUTANEOUS
  Filled 2016-05-30 (×8): qty 0.8

## 2016-05-30 MED ORDER — SODIUM CHLORIDE 0.9 % IV BOLUS (SEPSIS)
1000.0000 mL | Freq: Once | INTRAVENOUS | Status: AC
  Administered 2016-05-30: 1000 mL via INTRAVENOUS

## 2016-05-30 NOTE — Progress Notes (Signed)
CRITICAL VALUE ALERT  Critical value received: Lactic Acid 2.3  Date of notification: 05/30/2016  Time of notification: 0148  Critical value read back: Yes  Nurse who received alert: Rayburn Go  MD notified (1st page): Donnal Debar  Time of first page: [phone]

## 2016-05-30 NOTE — Progress Notes (Signed)
Patient has home machine in room. Machine is setup and patient equipment is all being used. Pateint places himself on and off machine.

## 2016-05-30 NOTE — Progress Notes (Signed)
CRITICAL VALUE ALERT  Critical value received: Lactic Acid 2.2  Date of notification: 05/30/2016  Time of notification: 0510  Critical value read back: Yes  Nurse who received alert: Rayburn Go  MD notified (1st page): Donnal Debar  Time of first page: [phone]

## 2016-05-30 NOTE — Progress Notes (Signed)
PROGRESS NOTE    Matthew Robinson  YTK:[id number] DOB: 1945/10/23 DOA: 05/29/2016 PCP: Wenda Low, MD    Brief Narrative:  Matthew Robinson is a 70 y.o. male with medical history significant for hyperlipidemia, mitral valve prolapse who is being admitted to the hospital this evening due to sepsis from cellulitis. The patient is somewhat confused, history taken from the patient as well as his wife who is at the bedside in the ER this evening. It seems that over the last few days, the patient has had increasing swelling in his left lower extremity, this has caused what he perceived to be balance problems, and he has also been feeling somewhat short of breath the last few days. Today he went swimming, and according to his wife he says he was not able to swim as much as usual and when he came home he was behaving somewhat strangely and acting mildly confused. Due to his complaints of shortness of breath, and his confusion, his wife took him to urgent care from where he was referred to the emergency department. He denies having any chest pain, nausea, vomiting, fevers, chills. Denies any injury or cuts on his skin especially his lower cavity. He does have a history of peripheral edema for which she takes when necessary oral Lasix, but this first time that he has had unilateral edema.   In the emergency department, he was found to have an elevated lactic acid, fever, leukocytosis, and evidence of left lower extremity cellulitis. He was started on IV vancomycin as well as IV Rocephin and given IV fluids. He was not hypotensive.     Assessment & Plan:   Principal Problem:   Sepsis ([place]) Active Problems:   Cellulitis   Hypothyroidism   GERD (gastroesophageal reflux disease)    Cellulitis - met sepsis criteria with leukocytosis, fever and lactic acidosis. -Inpatient admission -His received 1 L of IV fluids in the emergency department, will continue 6 mL/kg bolus - lactic acid slowly  downtrending -Empiric IV antibiotics including IV Zosyn and IV vancomycin - blood culture growing GPC in chains  - will transition to Vancomycin and Rocephin -No evidence of deeper infection such as abscess or osteomyelitis - will await sensitivities and culture   DVT prophylaxis: Lovenox Code Status: Full code Family Communication: Discussed with patient and wife  Disposition Plan: plan to discharge home to previous home environment when improved   Consultants:   none  Procedures:   None  Antimicrobials:   Zosyn 11/17>  Vancomycin 11/17>  Ceftriaxone 11/17>    Subjective: Patient laying in bed. Voices that he feels well today. He is wondering when he can go home.  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  Says leg erythema has not improved much.  Mentions that he otherwise feels fine.  Does have a history of right foot cellulitis that he states was treated outpatient with doxycycline.  Objective: Vitals:   05/29/16 2319 05/30/16 0116 05/30/16 0505 05/30/16 0939  BP: 105/60 (!) 108/49 (!) 103/53 (!) 115/52  Pulse: 75  64 70  Resp: (!) _0 Temp: (!) 102.7 F (39.3 C) 100.2 F (37.9 C) 98.5 F (36.9 C) (!) 102.1 F (38.9 C)  TempSrc:  Axillary Oral Oral  SpO2: 100% 96% 99% 98%  Weight: 131.1 kg (289 lb)     Height: _1  (1.956 m)       Intake/Output Summary (Last 24 hours) at 05/30/16 1138 Last data filed at 05/30/16 1100  Gross per 24 hour  Intake             3470 ml  Output             1375 ml  Net             2095 ml   Filed Weights   05/29/16 1836 05/29/16 2319  Weight: 132 kg (291 lb) 131.1 kg (289 lb)    Examination:  General exam: Appears calm and comfortable  Respiratory system: Clear to auscultation. Respiratory effort normal. Cardiovascular system: S1 & S2 heard, RRR. No JVD, murmurs, rubs, gallops or clicks. No pedal edema. Gastrointestinal system: Abdomen is nondistended, soft and nontender. No organomegaly or masses felt. Normal  bowel sounds heard. Central nervous system: Alert and oriented. No focal neurological deficits. Extremities: Symmetric 5 x 5 power. Skin: Erythema over the lateral mid foot, lateral malleolus, up the mid lateral lower leg, warm to the touch, not tender, edematous, crack in heel noted on the posterior left heel Psychiatry: Judgement and insight appear normal. Mood & affect appropriate.     Data Reviewed: I have personally reviewed following labs and imaging studies  CBC:  Recent Labs Lab 05/29/16 1641 05/29/16 1957 05/30/16 0354  WBC 11.9* 11.5* 11.4*  NEUTROABS  --  10.2*  --   HGB 16.1 15.2 14.0  HCT 46.7 44.6 42.9  MCV 91.2 92.9 93.9  PLT  --  138* 001*   Basic Metabolic Panel:  Recent Labs Lab 05/29/16 1620 05/29/16 1957 05/30/16 0354  NA 139 136 136  K 4.1 3.9 4.1  CL 103 102 103  CO2 _0 GLUCOSE 127* 134* 144*  BUN _1 CREATININE 0.74 0.99 1.15  CALCIUM 9.9 9.7 8.4*   GFR: Estimated Creatinine Clearance: 87 mL/min (by C-G formula based on SCr of 1.15 mg/dL). Liver Function Tests: No results for input(s): AST, ALT, ALKPHOS, BILITOT, PROT, ALBUMIN in the last 168 hours. No results for input(s): LIPASE, AMYLASE in the last 168 hours. No results for input(s): AMMONIA in the last 168 hours. Coagulation Profile: No results for input(s): INR, PROTIME in the last 168 hours. Cardiac Enzymes: No results for input(s): CKTOTAL, CKMB, CKMBINDEX, TROPONINI in the last 168 hours. BNP (last 3 results) No results for input(s): PROBNP in the last 8760 hours. HbA1C: No results for input(s): HGBA1C in the last 72 hours. CBG: No results for input(s): GLUCAP in the last 168 hours. Lipid Profile: No results for input(s): CHOL, HDL, LDLCALC, TRIG, CHOLHDL, LDLDIRECT in the last 72 hours. Thyroid Function Tests:  Recent Labs  05/29/16 1620  TSH 1.90  T4TOTAL 7.7   Anemia Panel: No results for input(s): VITAMINB12, FOLATE, FERRITIN, TIBC, IRON, RETICCTPCT in  the last 72 hours. Sepsis Labs:  Recent Labs Lab 05/29/16 1900 05/29/16 2159 05/30/16 0045 05/30/16 0354  LATICACIDVEN 3.00* 2.94* 2.3* 2.2*    Recent Results (from the past 240 hour(s))  Blood culture (routine x 2)     Status: None (Preliminary result)   Collection Time: 05/29/16  7:57 PM  Result Value Ref Range Status   Specimen Description BLOOD RIGHT FOREARM  Final   Special Requests BOTTLES DRAWN AEROBIC AND ANAEROBIC 5CC  Final   Culture  Setup Time   Final    GRAM POSITIVE COCCI IN CHAINS AEROBIC BOTTLE ONLY CRITICAL RESULT CALLED TO, READ BACK BY AND VERIFIED WITH: A. MASTERS, PHARM AT 1135 ON [id number] BY S. YARBROUGH    Culture TOO YOUNG TO  READ  Final   Report Status PENDING  Incomplete  Blood culture (routine x 2)     Status: None (Preliminary result)   Collection Time: 05/29/16  8:15 PM  Result Value Ref Range Status   Specimen Description BLOOD RIGHT HAND  Final   Special Requests BOTTLES DRAWN AEROBIC AND ANAEROBIC 5CC  Final   Culture  Setup Time   Final    GRAM POSITIVE COCCI IN CHAINS AEROBIC BOTTLE ONLY Organism ID to follow CRITICAL RESULT CALLED TO, READ BACK BY AND VERIFIED WITH: A. MASTERS, PHARM AT 1135 ON [id number] BY Rhea Bleacher    Culture TOO YOUNG TO READ  Final   Report Status PENDING  Incomplete         Radiology Studies: Dg Chest 2 View  Result Date: 05/29/2016 CLINICAL DATA:  shortness of breath, dizziness, peripheral edema EXAM: CHEST  2 VIEW COMPARISON:  Report from 10/08/1999 FINDINGS: Thoracic spondylosis. Upper normal heart size. The lungs appear clear. Atherosclerotic calcification of the aortic arch noted. No pleural effusion identified. IMPRESSION: 1.  No active cardiopulmonary disease is radiographically apparent. 2. Atherosclerotic aortic arch. Electronically Signed   By: Van Clines M.D.   On: 05/29/2016 16:25        Scheduled Meds: . [START ON 06/01/2016] aspirin EC  81 mg Oral Once per day on Mon Thu  .  cholecalciferol  2,000 Units Oral QODAY  . docusate sodium  100 mg Oral BID  . enoxaparin (LOVENOX) injection  65 mg Subcutaneous Q24H  . levothyroxine  50 mcg Oral QODAY  . [START ON 05/31/2016] levothyroxine  75 mcg Oral QODAY  . [START ON 06/01/2016] multivitamin with minerals  1 tablet Oral Once per day on Mon Thu  . piperacillin-tazobactam (ZOSYN)  IV  3.375 g Intravenous Q8H  . [START ON 06/01/2016] simvastatin  20 mg Oral Once per day on Mon Wed Fri  . vancomycin  1,250 mg Intravenous Q12H   Continuous Infusions:   LOS: 1 day    Time spent: 25 minutes    Loretha Stapler, MD [facility] Pager [phone]  If 7PM-7AM, please contact night-coverage [email or web address] Password [email or web address] 05/30/2016, 11:38 AM

## 2016-05-30 NOTE — Progress Notes (Signed)
PHARMACY - PHYSICIAN COMMUNICATION CRITICAL VALUE ALERT - BLOOD CULTURE IDENTIFICATION (BCID)  No results found for this or any previous visit.   Uncertain why micro results not updated- phone call received alerting RPh to Strep Species  Name of physician (or Provider) Contacted: Dr. Troy Sine  Changes to prescribed antibiotics required: narrowed to ceftriaxone alone. Will watch cellulitis and start MRSA coverage if worsens  Jecenia Leamer 05/30/2016  12:30 PM

## 2016-05-31 ENCOUNTER — Inpatient Hospital Stay (HOSPITAL_COMMUNITY): Payer: Medicare Other

## 2016-05-31 LAB — URINE CULTURE

## 2016-05-31 LAB — LACTIC ACID, PLASMA: Lactic Acid, Venous: 2 mmol/L (ref 0.5–1.9)

## 2016-05-31 MED ORDER — SODIUM CHLORIDE 0.9 % IV SOLN
INTRAVENOUS | Status: DC
  Administered 2016-05-31 – 2016-06-02 (×5): via INTRAVENOUS

## 2016-05-31 MED ORDER — BISACODYL 5 MG PO TBEC
5.0000 mg | DELAYED_RELEASE_TABLET | Freq: Every day | ORAL | Status: DC | PRN
  Administered 2016-06-03: 5 mg via ORAL
  Filled 2016-05-31: qty 1

## 2016-05-31 NOTE — Progress Notes (Signed)
PROGRESS NOTE    Matthew Robinson  WUJ:[id number] DOB: [birth year] DOA: 05/29/2016 PCP: Wenda Low, MD    Brief Narrative:  Matthew Robinson is a 70 y.o. male with medical history significant for hyperlipidemia, mitral valve prolapse who is being admitted to the hospital this evening due to sepsis from cellulitis. The patient is somewhat confused, history taken from the patient as well as his wife who is at the bedside in the ER this evening. It seems that over the last few days, the patient has had increasing swelling in his left lower extremity, this has caused what he perceived to be balance problems, and he has also been feeling somewhat short of breath the last few days. Today he went swimming, and according to his wife he says he was not able to swim as much as usual and when he came home he was behaving somewhat strangely and acting mildly confused. Due to his complaints of shortness of breath, and his confusion, his wife took him to urgent care from where he was referred to the emergency department. He denies having any chest pain, nausea, vomiting, fevers, chills. Denies any injury or cuts on his skin especially his lower cavity. He does have a history of peripheral edema for which she takes when necessary oral Lasix, but this first time that he has had unilateral edema.   In the emergency department, he was found to have an elevated lactic acid, fever, leukocytosis, and evidence of left lower extremity cellulitis. He was started on IV vancomycin as well as IV Rocephin and given IV fluids. He was not hypotensive.     Assessment & Plan:   Principal Problem:   Sepsis ([place]) Active Problems:   Cellulitis   Hypothyroidism   GERD (gastroesophageal reflux disease)    Cellulitis - met sepsis criteria with leukocytosis, fever and lactic acidosis -His received 1 L of IV fluids in the emergency department - restart IVF of NS at 182m/hr - lactic acid slowly downtrending '@2'$ .0 today - Empiric  IV antibiotics including IV Zosyn and IV vancomycin - blood culture growing GPC in chains - will transition to Vancomycin and Rocephin -No evidence of deeper infection such as abscess or osteomyelitis - will await sensitivities - MRI of foot ordered today as ankle and foot more swollen- no signs of abscess or osteo - continue rocephin   DVT prophylaxis: Lovenox Code Status: Full code Family Communication: Discussed with patient and wife  Disposition Plan: plan to discharge home to previous home environment when improved   Consultants:   none  Procedures:   None  Antimicrobials:   Zosyn 11/17> 11/18  Vancomycin 11/17> 11/18  Ceftriaxone 11/17>    Subjective: Patient reports pain in foot today.  Says his foot is also more swollen particularly on the top and at the ankle.  Redness has increased as well.  Patient had fever last night and yesterday evening but states he has not felt febrile since then.    Objective: Vitals:   05/30/16 1910 05/30/16 2105 05/31/16 0501 05/31/16 1043  BP:  (!) 146/101 (!) 93/48 (!) 118/54  Pulse:  68 63 65  Resp:  '18 16 17  '$ Temp: (!) 101.8 F (38.8 C) (!) 100.4 F (38 C) 98.7 F (37.1 C) 97.4 F (36.3 C)  TempSrc:  Oral Oral Oral  SpO2:  95% 97% 98%  Weight:  131.5 kg (289 lb 14.5 oz)    Height:        Intake/Output Summary (Last 24 hours)  at 05/31/16 1447 Last data filed at 05/31/16 1109  Gross per 24 hour  Intake              610 ml  Output             1175 ml  Net             -565 ml   Filed Weights   05/29/16 1836 05/29/16 2319 05/30/16 2105  Weight: 132 kg (291 lb) 131.1 kg (289 lb) 131.5 kg (289 lb 14.5 oz)    Examination:  General exam: Appears calm and comfortable  Respiratory system: Clear to auscultation. Respiratory effort normal. Cardiovascular system: S1 & S2 heard, RRR. No JVD, murmurs, rubs, gallops or clicks. No pedal edema. Gastrointestinal system: Abdomen is nondistended, soft and nontender. No  organomegaly or masses felt. Normal bowel sounds heard. Central nervous system: Alert and oriented. No focal neurological deficits. Extremities: Symmetric 5 x 5 power. Skin: Erythema over the ventral foot, lateral malleolus, up the mid lateral lower leg, warm to the touch, tender to palpation, edematous, crack in heel noted on the posterior left heel Psychiatry: Judgement and insight appear normal. Mood & affect appropriate.     Data Reviewed: I have personally reviewed following labs and imaging studies  CBC:  Recent Labs Lab 05/29/16 1641 05/29/16 1957 05/30/16 0354  WBC 11.9* 11.5* 11.4*  NEUTROABS  --  10.2*  --   HGB 16.1 15.2 14.0  HCT 46.7 44.6 42.9  MCV 91.2 92.9 93.9  PLT  --  138* 694*   Basic Metabolic Panel:  Recent Labs Lab 05/29/16 1620 05/29/16 1957 05/30/16 0354  NA 139 136 136  K 4.1 3.9 4.1  CL 103 102 103  CO2 '24 23 24  '$ GLUCOSE 127* 134* 144*  BUN '16 13 13  '$ CREATININE 0.74 0.99 1.15  CALCIUM 9.9 9.7 8.4*   GFR: Estimated Creatinine Clearance: 87.1 mL/min (by C-G formula based on SCr of 1.15 mg/dL). Liver Function Tests: No results for input(s): AST, ALT, ALKPHOS, BILITOT, PROT, ALBUMIN in the last 168 hours. No results for input(s): LIPASE, AMYLASE in the last 168 hours. No results for input(s): AMMONIA in the last 168 hours. Coagulation Profile: No results for input(s): INR, PROTIME in the last 168 hours. Cardiac Enzymes: No results for input(s): CKTOTAL, CKMB, CKMBINDEX, TROPONINI in the last 168 hours. BNP (last 3 results) No results for input(s): PROBNP in the last 8760 hours. HbA1C: No results for input(s): HGBA1C in the last 72 hours. CBG: No results for input(s): GLUCAP in the last 168 hours. Lipid Profile: No results for input(s): CHOL, HDL, LDLCALC, TRIG, CHOLHDL, LDLDIRECT in the last 72 hours. Thyroid Function Tests:  Recent Labs  05/29/16 1620  TSH 1.90  T4TOTAL 7.7   Anemia Panel: No results for input(s): VITAMINB12,  FOLATE, FERRITIN, TIBC, IRON, RETICCTPCT in the last 72 hours. Sepsis Labs:  Recent Labs Lab 05/29/16 2159 05/30/16 0045 05/30/16 0354 05/31/16 1031  LATICACIDVEN 2.94* 2.3* 2.2* 2.0*    Recent Results (from the past 240 hour(s))  Blood culture (routine x 2)     Status: Abnormal (Preliminary result)   Collection Time: 05/29/16  7:57 PM  Result Value Ref Range Status   Specimen Description BLOOD RIGHT FOREARM  Final   Special Requests BOTTLES DRAWN AEROBIC AND ANAEROBIC 5CC  Final   Culture  Setup Time   Final    GRAM POSITIVE COCCI IN CHAINS IN BOTH AEROBIC AND ANAEROBIC BOTTLES CRITICAL RESULT CALLED TO, READ  BACK BY AND VERIFIED WITH: A. MASTERS, PHARM AT 1135 ON [id number] BY S. YARBROUGH    Culture STREPTOCOCCUS GROUP G (A)  Final   Report Status PENDING  Incomplete  Blood culture (routine x 2)     Status: Abnormal (Preliminary result)   Collection Time: 05/29/16  8:15 PM  Result Value Ref Range Status   Specimen Description BLOOD RIGHT HAND  Final   Special Requests BOTTLES DRAWN AEROBIC AND ANAEROBIC 5CC  Final   Culture  Setup Time   Final    GRAM POSITIVE COCCI IN CHAINS AEROBIC BOTTLE ONLY CRITICAL RESULT CALLED TO, READ BACK BY AND VERIFIED WITH: A. MASTERS, PHARM AT 4174 ON [id number] BY Rhea Bleacher    Culture STREPTOCOCCUS GROUP G (A)  Final   Report Status PENDING  Incomplete  Blood Culture ID Panel (Reflexed)     Status: Abnormal   Collection Time: 05/29/16  8:15 PM  Result Value Ref Range Status   Enterococcus species NOT DETECTED NOT DETECTED Final   Listeria monocytogenes NOT DETECTED NOT DETECTED Final   Staphylococcus species NOT DETECTED NOT DETECTED Final   Staphylococcus aureus NOT DETECTED NOT DETECTED Final   Streptococcus species DETECTED (A) NOT DETECTED Final    Comment: CRITICAL RESULT CALLED TO, READ BACK BY AND VERIFIED WITH: A MASTERS,PHARMD AT 1135 05/30/16 BY S YARBROUGH    Streptococcus agalactiae NOT DETECTED NOT DETECTED Final    Streptococcus pneumoniae NOT DETECTED NOT DETECTED Final   Streptococcus pyogenes NOT DETECTED NOT DETECTED Final   Acinetobacter baumannii NOT DETECTED NOT DETECTED Final   Enterobacteriaceae species NOT DETECTED NOT DETECTED Final   Enterobacter cloacae complex NOT DETECTED NOT DETECTED Final   Escherichia coli NOT DETECTED NOT DETECTED Final   Klebsiella oxytoca NOT DETECTED NOT DETECTED Final   Klebsiella pneumoniae NOT DETECTED NOT DETECTED Final   Proteus species NOT DETECTED NOT DETECTED Final   Serratia marcescens NOT DETECTED NOT DETECTED Final   Haemophilus influenzae NOT DETECTED NOT DETECTED Final   Neisseria meningitidis NOT DETECTED NOT DETECTED Final   Pseudomonas aeruginosa NOT DETECTED NOT DETECTED Final   Candida albicans NOT DETECTED NOT DETECTED Final   Candida glabrata NOT DETECTED NOT DETECTED Final   Candida krusei NOT DETECTED NOT DETECTED Final   Candida parapsilosis NOT DETECTED NOT DETECTED Final   Candida tropicalis NOT DETECTED NOT DETECTED Final  Urine culture     Status: Abnormal   Collection Time: 05/29/16  9:18 PM  Result Value Ref Range Status   Specimen Description URINE, CLEAN CATCH  Final   Special Requests NONE  Final   Culture MULTIPLE SPECIES PRESENT, SUGGEST RECOLLECTION (A)  Final   Report Status 05/31/2016 FINAL  Final         Radiology Studies: Dg Chest 2 View  Result Date: 05/29/2016 CLINICAL DATA:  shortness of breath, dizziness, peripheral edema EXAM: CHEST  2 VIEW COMPARISON:  Report from 10/08/1999 FINDINGS: Thoracic spondylosis. Upper normal heart size. The lungs appear clear. Atherosclerotic calcification of the aortic arch noted. No pleural effusion identified. IMPRESSION: 1.  No active cardiopulmonary disease is radiographically apparent. 2. Atherosclerotic aortic arch. Electronically Signed   By: Van Clines M.D.   On: 05/29/2016 16:25   Mr Ankle Left Wo Contrast  Result Date: 05/31/2016 CLINICAL DATA:  Cellulitis  left lower leg and heel with diffuse with swelling and erythema for a few days. Fever. EXAM: MRI OF THE LEFT ANKLE WITHOUT CONTRAST TECHNIQUE: Multiplanar, multisequence MR imaging of the ankle was performed.  No intravenous contrast was administered. COMPARISON:  None. FINDINGS: Intense and diffuse subcutaneous edema about the left ankle is identified. No focal fluid collection is seen. TENDONS Peroneal: Intact. Trace amount of fluid is seen in the sheath of the tendons, likely incidental. Posteromedial: Intact. Anterior: Intact. Achilles: Intact. Plantar Fascia: Mildly increased T2 signal is seen in the medial cord. The plantar fascia is intact. Plantar calcaneal spur is noted. LIGAMENTS Lateral: Intact. Medial: Intact. CARTILAGE Ankle Joint: Negative. Subtalar Joints/Sinus Tarsi: Negative. Bones: No marrow signal abnormality to suggest osteomyelitis is identified. No fracture, stress change or worrisome lesion. Very small calcaneocuboid joint effusion is incidentally noted. Other: None. IMPRESSION: Diffuse and intense subcutaneous edema consistent with cellulitis. Negative for abscess, osteomyelitis or septic joint. Mild plantar fasciitis. Electronically Signed   By: Inge Rise M.D.   On: 05/31/2016 12:09        Scheduled Meds: . [START ON 06/01/2016] aspirin EC  81 mg Oral Once per day on Mon Thu  . cefTRIAXone (ROCEPHIN)  IV  2 g Intravenous Q24H  . cholecalciferol  2,000 Units Oral QODAY  . docusate sodium  100 mg Oral BID  . enoxaparin (LOVENOX) injection  65 mg Subcutaneous Q24H  . levothyroxine  50 mcg Oral QODAY  . levothyroxine  75 mcg Oral QODAY  . [START ON 06/01/2016] multivitamin with minerals  1 tablet Oral Once per day on Mon Thu  . [START ON 06/01/2016] simvastatin  20 mg Oral Once per day on Mon Wed Fri   Continuous Infusions: . sodium chloride 125 mL/hr at 05/31/16 1053     LOS: 2 days    Time spent: 30 minutes    Loretha Stapler, MD [facility] Pager  [phone]  If 7PM-7AM, please contact night-coverage [email or web address] Password [email or web address] 05/31/2016, 2:47 PM

## 2016-06-01 DIAGNOSIS — A408 Other streptococcal sepsis: Secondary | ICD-10-CM

## 2016-06-01 LAB — CBC
HEMATOCRIT: 38.8 % — AB (ref 39.0–52.0)
Hemoglobin: 13 g/dL (ref 13.0–17.0)
MCH: 31.4 pg (ref 26.0–34.0)
MCHC: 33.5 g/dL (ref 30.0–36.0)
MCV: 93.7 fL (ref 78.0–100.0)
Platelets: 127 10*3/uL — ABNORMAL LOW (ref 150–400)
RBC: 4.14 MIL/uL — AB (ref 4.22–5.81)
RDW: 14.6 % (ref 11.5–15.5)
WBC: 6 10*3/uL (ref 4.0–10.5)

## 2016-06-01 LAB — CULTURE, BLOOD (ROUTINE X 2)

## 2016-06-01 NOTE — Progress Notes (Signed)
PROGRESS NOTE    Matthew Robinson  OHY:[id number] DOB: February 22, 1946 DOA: 05/29/2016 PCP: Wenda Low, MD    Brief Narrative:  Matthew Robinson is a 70 y.o. male with medical history significant for hyperlipidemia, mitral valve prolapse who is being admitted to the hospital this evening due to sepsis from cellulitis. The patient is somewhat confused, history taken from the patient as well as his wife who is at the bedside in the ER this evening. It seems that over the last few days, the patient has had increasing swelling in his left lower extremity, this has caused what he perceived to be balance problems, and he has also been feeling somewhat short of breath the last few days. Today he went swimming, and according to his wife he says he was not able to swim as much as usual and when he came home he was behaving somewhat strangely and acting mildly confused. Due to his complaints of shortness of breath, and his confusion, his wife took him to urgent care from where he was referred to the emergency department. He denies having any chest pain, nausea, vomiting, fevers, chills. Denies any injury or cuts on his skin especially his lower cavity. He does have a history of peripheral edema for which she takes when necessary oral Lasix, but this first time that he has had unilateral edema.   In the emergency department, he was found to have an elevated lactic acid, fever, leukocytosis, and evidence of left lower extremity cellulitis. He was started on IV vancomycin as well as IV Rocephin and given IV fluids. He was not hypotensive.     Assessment & Plan:   Principal Problem:   Sepsis ([place]) Active Problems:   Cellulitis   Hypothyroidism   GERD (gastroesophageal reflux disease)    Cellulitis - met sepsis criteria with leukocytosis, fever and lactic acidosis - received 1 L of IV fluids in the emergency department - restart IVF of NS at 136m/hr - lactic acid slowly downtrending _0 .0 today - Empiric IV  antibiotics including IV Zosyn and IV vancomycin - blood culture growing GPC in chains- results of Group G Strep - on Rocephin 2g q24 - will repeat blood cultures today - TTE ordered - No evidence of deeper infection such as abscess or osteomyelitis per MRI - continue to monitor - Patient encouraged to keep foot elevated when in bed    DVT prophylaxis: Lovenox Code Status: Full code Family Communication: Discussed with patient  Disposition Plan: plan to discharge home to previous home environment 2nd set of blood cultures negative and TEE performed and rules out endocarditis   Consultants:   none  Procedures:   None  Antimicrobials:   Zosyn 11/17> 11/18  Vancomycin 11/17> 11/18  Ceftriaxone 11/17>    Subjective: Patient says redness in his foot has gotten somewhat better today.  He mentions that he feels the swelling is about the same.  Voice that he does have pain still on the side of the ankle and side of lower leg.     Objective: Vitals:   05/31/16 1822 05/31/16 2220 06/01/16 0500 06/01/16 0948  BP: (!) 118/59 127/61 106/70 (!) 113/58  Pulse: 68 66 60 (!) 107  Resp: _1 Temp: (!) 100.8 F (38.2 C) 98.3 F (36.8 C) 98.3 F (36.8 C) 98.2 F (36.8 C)  TempSrc: Oral Oral Oral Oral  SpO2: 100% 96% 94% 96%  Weight:  132 kg (291 lb 0.1 oz)    Height:  Intake/Output Summary (Last 24 hours) at 06/01/16 1333 Last data filed at 06/01/16 1302  Gross per 24 hour  Intake          3269.58 ml  Output             1551 ml  Net          1718.58 ml   Filed Weights   05/29/16 2319 05/30/16 2105 05/31/16 2220  Weight: 131.1 kg (289 lb) 131.5 kg (289 lb 14.5 oz) 132 kg (291 lb 0.1 oz)    Examination:  General exam: Appears calm and comfortable  Respiratory system: Clear to auscultation. Respiratory effort normal. Cardiovascular system: S1 & S2 heard, RRR. No JVD, murmurs, rubs, gallops or clicks. Gastrointestinal system: Abdomen is nondistended, soft  and nontender. No organomegaly or masses felt. Normal bowel sounds heard. Central nervous system: Alert and oriented. No focal neurological deficits. Extremities: Symmetric 5 x 5 power. Skin: Erythema over the ventral foot, lateral malleolus, up the mid lateral lower leg (improved from prior exam), warm to the touch, tender to palpation, edematous, crack in heel noted on the posterior left heel Psychiatry: Judgement and insight appear normal. Mood & affect appropriate.     Data Reviewed: I have personally reviewed following labs and imaging studies  CBC:  Recent Labs Lab 05/29/16 1641 05/29/16 1957 05/30/16 0354 06/01/16 1143  WBC 11.9* 11.5* 11.4* 6.0  NEUTROABS  --  10.2*  --   --   HGB 16.1 15.2 14.0 13.0  HCT 46.7 44.6 42.9 38.8*  MCV 91.2 92.9 93.9 93.7  PLT  --  138* 131* 916*   Basic Metabolic Panel:  Recent Labs Lab 05/29/16 1620 05/29/16 1957 05/30/16 0354  NA 139 136 136  K 4.1 3.9 4.1  CL 103 102 103  CO2 _0 GLUCOSE 127* 134* 144*  BUN _1 CREATININE 0.74 0.99 1.15  CALCIUM 9.9 9.7 8.4*   GFR: Estimated Creatinine Clearance: 87.3 mL/min (by C-G formula based on SCr of 1.15 mg/dL). Liver Function Tests: No results for input(s): AST, ALT, ALKPHOS, BILITOT, PROT, ALBUMIN in the last 168 hours. No results for input(s): LIPASE, AMYLASE in the last 168 hours. No results for input(s): AMMONIA in the last 168 hours. Coagulation Profile: No results for input(s): INR, PROTIME in the last 168 hours. Cardiac Enzymes: No results for input(s): CKTOTAL, CKMB, CKMBINDEX, TROPONINI in the last 168 hours. BNP (last 3 results) No results for input(s): PROBNP in the last 8760 hours. HbA1C: No results for input(s): HGBA1C in the last 72 hours. CBG: No results for input(s): GLUCAP in the last 168 hours. Lipid Profile: No results for input(s): CHOL, HDL, LDLCALC, TRIG, CHOLHDL, LDLDIRECT in the last 72 hours. Thyroid Function Tests:  Recent Labs   05/29/16 1620  TSH 1.90  T4TOTAL 7.7   Anemia Panel: No results for input(s): VITAMINB12, FOLATE, FERRITIN, TIBC, IRON, RETICCTPCT in the last 72 hours. Sepsis Labs:  Recent Labs Lab 05/29/16 2159 05/30/16 0045 05/30/16 0354 05/31/16 1031  LATICACIDVEN 2.94* 2.3* 2.2* 2.0*    Recent Results (from the past 240 hour(s))  Blood culture (routine x 2)     Status: Abnormal   Collection Time: 05/29/16  7:57 PM  Result Value Ref Range Status   Specimen Description BLOOD RIGHT FOREARM  Final   Special Requests BOTTLES DRAWN AEROBIC AND ANAEROBIC 5CC  Final   Culture  Setup Time   Final    GRAM POSITIVE COCCI IN CHAINS IN BOTH  AEROBIC AND ANAEROBIC BOTTLES CRITICAL RESULT CALLED TO, READ BACK BY AND VERIFIED WITH: A. MASTERS, PHARM AT 1135 ON [id number] BY Rhea Bleacher    Culture (A)  Final    STREPTOCOCCUS GROUP G SUSCEPTIBILITIES PERFORMED ON PREVIOUS CULTURE WITHIN THE LAST 5 DAYS.    Report Status 06/01/2016 FINAL  Final  Blood culture (routine x 2)     Status: Abnormal   Collection Time: 05/29/16  8:15 PM  Result Value Ref Range Status   Specimen Description BLOOD RIGHT HAND  Final   Special Requests BOTTLES DRAWN AEROBIC AND ANAEROBIC 5CC  Final   Culture  Setup Time   Final    GRAM POSITIVE COCCI IN CHAINS AEROBIC BOTTLE ONLY CRITICAL RESULT CALLED TO, READ BACK BY AND VERIFIED WITH: A. MASTERS, PHARM AT [phone] ON [id number] BY Rhea Bleacher    Culture STREPTOCOCCUS GROUP G (A)  Final   Report Status 06/01/2016 FINAL  Final   Organism ID, Bacteria STREPTOCOCCUS GROUP G  Final      Susceptibility   Streptococcus group g - MIC*    CLINDAMYCIN <=0.25 SENSITIVE Sensitive     AMPICILLIN <=0.25 SENSITIVE Sensitive     ERYTHROMYCIN <=0.12 SENSITIVE Sensitive     VANCOMYCIN 0.5 SENSITIVE Sensitive     CEFTRIAXONE <=0.12 SENSITIVE Sensitive     LEVOFLOXACIN 1 SENSITIVE Sensitive     * STREPTOCOCCUS GROUP G  Blood Culture ID Panel (Reflexed)     Status: Abnormal   Collection Time:  05/29/16  8:15 PM  Result Value Ref Range Status   Enterococcus species NOT DETECTED NOT DETECTED Final   Listeria monocytogenes NOT DETECTED NOT DETECTED Final   Staphylococcus species NOT DETECTED NOT DETECTED Final   Staphylococcus aureus NOT DETECTED NOT DETECTED Final   Streptococcus species DETECTED (A) NOT DETECTED Final    Comment: CRITICAL RESULT CALLED TO, READ BACK BY AND VERIFIED WITH: A MASTERS,PHARMD AT 1135 05/30/16 BY S YARBROUGH    Streptococcus agalactiae NOT DETECTED NOT DETECTED Final   Streptococcus pneumoniae NOT DETECTED NOT DETECTED Final   Streptococcus pyogenes NOT DETECTED NOT DETECTED Final   Acinetobacter baumannii NOT DETECTED NOT DETECTED Final   Enterobacteriaceae species NOT DETECTED NOT DETECTED Final   Enterobacter cloacae complex NOT DETECTED NOT DETECTED Final   Escherichia coli NOT DETECTED NOT DETECTED Final   Klebsiella oxytoca NOT DETECTED NOT DETECTED Final   Klebsiella pneumoniae NOT DETECTED NOT DETECTED Final   Proteus species NOT DETECTED NOT DETECTED Final   Serratia marcescens NOT DETECTED NOT DETECTED Final   Haemophilus influenzae NOT DETECTED NOT DETECTED Final   Neisseria meningitidis NOT DETECTED NOT DETECTED Final   Pseudomonas aeruginosa NOT DETECTED NOT DETECTED Final   Candida albicans NOT DETECTED NOT DETECTED Final   Candida glabrata NOT DETECTED NOT DETECTED Final   Candida krusei NOT DETECTED NOT DETECTED Final   Candida parapsilosis NOT DETECTED NOT DETECTED Final   Candida tropicalis NOT DETECTED NOT DETECTED Final  Urine culture     Status: Abnormal   Collection Time: 05/29/16  9:18 PM  Result Value Ref Range Status   Specimen Description URINE, CLEAN CATCH  Final   Special Requests NONE  Final   Culture MULTIPLE SPECIES PRESENT, SUGGEST RECOLLECTION (A)  Final   Report Status 05/31/2016 FINAL  Final         Radiology Studies: Mr Ankle Left Wo Contrast  Result Date: 05/31/2016 CLINICAL DATA:  Cellulitis left  lower leg and heel with diffuse with swelling and erythema for  a few days. Fever. EXAM: MRI OF THE LEFT ANKLE WITHOUT CONTRAST TECHNIQUE: Multiplanar, multisequence MR imaging of the ankle was performed. No intravenous contrast was administered. COMPARISON:  None. FINDINGS: Intense and diffuse subcutaneous edema about the left ankle is identified. No focal fluid collection is seen. TENDONS Peroneal: Intact. Trace amount of fluid is seen in the sheath of the tendons, likely incidental. Posteromedial: Intact. Anterior: Intact. Achilles: Intact. Plantar Fascia: Mildly increased T2 signal is seen in the medial cord. The plantar fascia is intact. Plantar calcaneal spur is noted. LIGAMENTS Lateral: Intact. Medial: Intact. CARTILAGE Ankle Joint: Negative. Subtalar Joints/Sinus Tarsi: Negative. Bones: No marrow signal abnormality to suggest osteomyelitis is identified. No fracture, stress change or worrisome lesion. Very small calcaneocuboid joint effusion is incidentally noted. Other: None. IMPRESSION: Diffuse and intense subcutaneous edema consistent with cellulitis. Negative for abscess, osteomyelitis or septic joint. Mild plantar fasciitis. Electronically Signed   By: Inge Rise M.D.   On: 05/31/2016 12:09        Scheduled Meds: . aspirin EC  81 mg Oral Once per day on Mon Thu  . cefTRIAXone (ROCEPHIN)  IV  2 g Intravenous Q24H  . cholecalciferol  2,000 Units Oral QODAY  . docusate sodium  100 mg Oral BID  . enoxaparin (LOVENOX) injection  65 mg Subcutaneous Q24H  . levothyroxine  50 mcg Oral QODAY  . levothyroxine  75 mcg Oral QODAY  . multivitamin with minerals  1 tablet Oral Once per day on Mon Thu  . simvastatin  20 mg Oral Once per day on Mon Wed Fri   Continuous Infusions: . sodium chloride 125 mL/hr at 06/01/16 1218     LOS: 3 days    Time spent: 30 minutes    Loretha Stapler, MD [facility] Pager [phone]  If 7PM-7AM, please contact  night-coverage [email or web address] Password [email or web address] 06/01/2016, 1:33 PM

## 2016-06-01 NOTE — Consult Note (Signed)
            [facility] CM Primary Care Navigator  06/01/2016  Matthew Robinson May 30, 1946 [id number]    Seen patient at the bedside to identify possible discharge needs. Patientreports swelling, redness, pain and tightness to left lower extremity which had led to this admission.   Patient shares Dr. Wenda Robinson with [place] at [facility] as the primary care provider.   Patient Matthew Robinson in [place] to obtain medications without difficulty. He reports managing his own medications at home using combination of "pill box" system and taking straight out of the containers.  Patient is independent with self care and verbalized being able to drive prior to admission. His wife  Matthew Robinson) will be able to provide transportation tohisdoctors'appointments when needed.   Plan for discharge is home per patient. Wife will be the primary caregiver at home as needed.  Patient expressed understanding to call primary care provider's officeonce he gets home, for a post discharge follow-up appointment within a week or soonerif needs arise.Patient letter provided for his reminder.  Patient deniesany other needs or concerns at this time.    For additional questions please contact:  Matthew Robinson, BSN, RN-BC [facility] PRIMARY CARE Navigator Cell: [phone]

## 2016-06-02 ENCOUNTER — Inpatient Hospital Stay (HOSPITAL_COMMUNITY): Payer: Medicare Other

## 2016-06-02 DIAGNOSIS — R7881 Bacteremia: Secondary | ICD-10-CM

## 2016-06-02 LAB — ECHOCARDIOGRAM COMPLETE
HEIGHTINCHES: 77 in
WEIGHTICAEL: 4656.12 [oz_av]

## 2016-06-02 MED ORDER — CLINDAMYCIN PHOSPHATE 600 MG/50ML IV SOLN
600.0000 mg | Freq: Three times a day (TID) | INTRAVENOUS | Status: DC
Start: 2016-06-02 — End: 2016-06-04
  Administered 2016-06-02 – 2016-06-04 (×5): 600 mg via INTRAVENOUS
  Filled 2016-06-02 (×5): qty 50

## 2016-06-02 MED ORDER — FUROSEMIDE 20 MG PO TABS
20.0000 mg | ORAL_TABLET | Freq: Every day | ORAL | Status: DC
Start: 2016-06-02 — End: 2016-06-03
  Administered 2016-06-02 – 2016-06-03 (×2): 20 mg via ORAL
  Filled 2016-06-02 (×2): qty 1

## 2016-06-02 MED ORDER — SODIUM CHLORIDE 0.9 % IV SOLN
2.0000 g | Freq: Four times a day (QID) | INTRAVENOUS | Status: DC
  Administered 2016-06-02 – 2016-06-05 (×12): 2 g via INTRAVENOUS
  Filled 2016-06-02 (×15): qty 2000

## 2016-06-02 MED ORDER — FUROSEMIDE 20 MG PO TABS
20.0000 mg | ORAL_TABLET | Freq: Once | ORAL | Status: AC
  Administered 2016-06-02: 20 mg via ORAL
  Filled 2016-06-02: qty 1

## 2016-06-02 MED ORDER — HYDROCOD POLST-CPM POLST ER 10-8 MG/5ML PO SUER
5.0000 mL | Freq: Once | ORAL | Status: AC
  Administered 2016-06-02: 5 mL via ORAL
  Filled 2016-06-02: qty 5

## 2016-06-02 NOTE — Progress Notes (Signed)
PROGRESS NOTE    Matthew Robinson  IFO:[id number] DOB: 05-20-1946 DOA: 05/29/2016 PCP: Wenda Low, MD    Brief Narrative:  Matthew Robinson is a 70 y.o. male with medical history significant for hyperlipidemia, mitral valve prolapse who is being admitted to the hospital this evening due to sepsis from cellulitis. The patient is somewhat confused, history taken from the patient as well as his wife who is at the bedside in the ER this evening. It seems that over the last few days, the patient has had increasing swelling in his left lower extremity, this has caused what he perceived to be balance problems, and he has also been feeling somewhat short of breath the last few days. Today he went swimming, and according to his wife he says he was not able to swim as much as usual and when he came home he was behaving somewhat strangely and acting mildly confused. Due to his complaints of shortness of breath, and his confusion, his wife took him to urgent care from where he was referred to the emergency department. He denies having any chest pain, nausea, vomiting, fevers, chills. Denies any injury or cuts on his skin especially his lower cavity. He does have a history of peripheral edema for which she takes when necessary oral Lasix, but this first time that he has had unilateral edema.   In the emergency department, he was found to have an elevated lactic acid, fever, leukocytosis, and evidence of left lower extremity cellulitis. He was started on IV vancomycin as well as IV Rocephin and given IV fluids. He was not hypotensive.     Assessment & Plan:   Principal Problem:   Sepsis ([place]) Active Problems:   Cellulitis   Hypothyroidism   GERD (gastroesophageal reflux disease)    Cellulitis - met sepsis criteria with leukocytosis, fever and lactic acidosis - received 1 L of IV fluids in the emergency department - d/c fluids today - lasix '20mg'$  PO x 2 today - lactic acid slowly downtrending '@2'$ .0  today - Empiric IV antibiotics including IV Zosyn and IV vancomycin - blood culture growing GPC in chains- results of Group G Strep - Rocephin transitioned to Ampicillin and Clindamycin today - repeat blood cultures negative x 1 day - TTE ordered - No evidence of deeper infection such as abscess or osteomyelitis per MRI - continue to monitor - Patient encouraged to keep foot elevated when in bed    DVT prophylaxis: Lovenox Code Status: Full code Family Communication: Discussed with patient  Disposition Plan: plan to discharge home to previous home environment 2nd set of blood cultures negative and TEE performed and rules out endocarditis   Consultants:   none  Procedures:   None  Antimicrobials:   Zosyn 11/17> 11/18  Vancomycin 11/17> 11/18  Ceftriaxone 11/17>    Subjective: Patient says redness in his foot has worsened overnight.  Mentions he feels that the swelling and pain have increased.  Has been elevating foot since yesterday.  Repeat blood cultures drawn yesterday. TTE ordered.  Objective: Vitals:   06/01/16 1724 06/01/16 2124 06/02/16 0500 06/02/16 0900  BP: 118/73 109/66 109/66 114/68  Pulse: 92 97 90 93  Resp: '18 19 17 20  '$ Temp: 98 F (36.7 C) 99.1 F (37.3 C) 98.9 F (37.2 C) 98.8 F (37.1 C)  TempSrc: Oral Oral Oral Oral  SpO2: 97% 98% 97% 97%  Weight:      Height:        Intake/Output Summary (Last 24 hours)  at 06/02/16 1554 Last data filed at 06/02/16 1400  Gross per 24 hour  Intake             3920 ml  Output             1775 ml  Net             2145 ml   Filed Weights   05/29/16 2319 05/30/16 2105 05/31/16 2220  Weight: 131.1 kg (289 lb) 131.5 kg (289 lb 14.5 oz) 132 kg (291 lb 0.1 oz)    Examination:  General exam: Appears calm and comfortable  Respiratory system: Clear to auscultation. Respiratory effort normal. Cardiovascular system: S1 & S2 heard, RRR. No JVD, murmurs, rubs, gallops or clicks. 1+ edema of right foot and ankle  2+ edema of left foot and ankle noted Gastrointestinal system: Abdomen is nondistended, soft and nontender. No organomegaly or masses felt. Normal bowel sounds heard. Central nervous system: Alert and oriented. No focal neurological deficits. Extremities: Symmetric 5 x 5 power. Skin: Erythema over the ventral foot, lateral malleolus, up the mid lateral lower leg, warm to the touch, tender to palpation, edematous, crack in heel noted on the posterior left heel Psychiatry: Judgement and insight appear normal. Mood & affect appropriate    Data Reviewed: I have personally reviewed following labs and imaging studies  CBC:  Recent Labs Lab 05/29/16 1641 05/29/16 1957 05/30/16 0354 06/01/16 1143  WBC 11.9* 11.5* 11.4* 6.0  NEUTROABS  --  10.2*  --   --   HGB 16.1 15.2 14.0 13.0  HCT 46.7 44.6 42.9 38.8*  MCV 91.2 92.9 93.9 93.7  PLT  --  138* 131* 546*   Basic Metabolic Panel:  Recent Labs Lab 05/29/16 1620 05/29/16 1957 05/30/16 0354  NA 139 136 136  K 4.1 3.9 4.1  CL 103 102 103  CO2 '24 23 24  '$ GLUCOSE 127* 134* 144*  BUN '16 13 13  '$ CREATININE 0.74 0.99 1.15  CALCIUM 9.9 9.7 8.4*   GFR: Estimated Creatinine Clearance: 87.3 mL/min (by C-G formula based on SCr of 1.15 mg/dL). Liver Function Tests: No results for input(s): AST, ALT, ALKPHOS, BILITOT, PROT, ALBUMIN in the last 168 hours. No results for input(s): LIPASE, AMYLASE in the last 168 hours. No results for input(s): AMMONIA in the last 168 hours. Coagulation Profile: No results for input(s): INR, PROTIME in the last 168 hours. Cardiac Enzymes: No results for input(s): CKTOTAL, CKMB, CKMBINDEX, TROPONINI in the last 168 hours. BNP (last 3 results) No results for input(s): PROBNP in the last 8760 hours. HbA1C: No results for input(s): HGBA1C in the last 72 hours. CBG: No results for input(s): GLUCAP in the last 168 hours. Lipid Profile: No results for input(s): CHOL, HDL, LDLCALC, TRIG, CHOLHDL, LDLDIRECT in the  last 72 hours. Thyroid Function Tests: No results for input(s): TSH, T4TOTAL, FREET4, T3FREE, THYROIDAB in the last 72 hours. Anemia Panel: No results for input(s): VITAMINB12, FOLATE, FERRITIN, TIBC, IRON, RETICCTPCT in the last 72 hours. Sepsis Labs:  Recent Labs Lab 05/29/16 2159 05/30/16 0045 05/30/16 0354 05/31/16 1031  LATICACIDVEN 2.94* 2.3* 2.2* 2.0*    Recent Results (from the past 240 hour(s))  Blood culture (routine x 2)     Status: Abnormal   Collection Time: 05/29/16  7:57 PM  Result Value Ref Range Status   Specimen Description BLOOD RIGHT FOREARM  Final   Special Requests BOTTLES DRAWN AEROBIC AND ANAEROBIC 5CC  Final   Culture  Setup Time  Final    GRAM POSITIVE COCCI IN CHAINS IN BOTH AEROBIC AND ANAEROBIC BOTTLES CRITICAL RESULT CALLED TO, READ BACK BY AND VERIFIED WITH: A. MASTERS, PHARM AT 1135 ON [id number] BY Rhea Bleacher    Culture (A)  Final    STREPTOCOCCUS GROUP G SUSCEPTIBILITIES PERFORMED ON PREVIOUS CULTURE WITHIN THE LAST 5 DAYS.    Report Status 06/01/2016 FINAL  Final  Blood culture (routine x 2)     Status: Abnormal   Collection Time: 05/29/16  8:15 PM  Result Value Ref Range Status   Specimen Description BLOOD RIGHT HAND  Final   Special Requests BOTTLES DRAWN AEROBIC AND ANAEROBIC 5CC  Final   Culture  Setup Time   Final    GRAM POSITIVE COCCI IN CHAINS AEROBIC BOTTLE ONLY CRITICAL RESULT CALLED TO, READ BACK BY AND VERIFIED WITH: A. MASTERS, PHARM AT 6270 ON [id number] BY Rhea Bleacher    Culture STREPTOCOCCUS GROUP G (A)  Final   Report Status 06/01/2016 FINAL  Final   Organism ID, Bacteria STREPTOCOCCUS GROUP G  Final      Susceptibility   Streptococcus group g - MIC*    CLINDAMYCIN <=0.25 SENSITIVE Sensitive     AMPICILLIN <=0.25 SENSITIVE Sensitive     ERYTHROMYCIN <=0.12 SENSITIVE Sensitive     VANCOMYCIN 0.5 SENSITIVE Sensitive     CEFTRIAXONE <=0.12 SENSITIVE Sensitive     LEVOFLOXACIN 1 SENSITIVE Sensitive     * STREPTOCOCCUS  GROUP G  Blood Culture ID Panel (Reflexed)     Status: Abnormal   Collection Time: 05/29/16  8:15 PM  Result Value Ref Range Status   Enterococcus species NOT DETECTED NOT DETECTED Final   Listeria monocytogenes NOT DETECTED NOT DETECTED Final   Staphylococcus species NOT DETECTED NOT DETECTED Final   Staphylococcus aureus NOT DETECTED NOT DETECTED Final   Streptococcus species DETECTED (A) NOT DETECTED Final    Comment: CRITICAL RESULT CALLED TO, READ BACK BY AND VERIFIED WITH: A MASTERS,PHARMD AT 1135 05/30/16 BY S YARBROUGH    Streptococcus agalactiae NOT DETECTED NOT DETECTED Final   Streptococcus pneumoniae NOT DETECTED NOT DETECTED Final   Streptococcus pyogenes NOT DETECTED NOT DETECTED Final   Acinetobacter baumannii NOT DETECTED NOT DETECTED Final   Enterobacteriaceae species NOT DETECTED NOT DETECTED Final   Enterobacter cloacae complex NOT DETECTED NOT DETECTED Final   Escherichia coli NOT DETECTED NOT DETECTED Final   Klebsiella oxytoca NOT DETECTED NOT DETECTED Final   Klebsiella pneumoniae NOT DETECTED NOT DETECTED Final   Proteus species NOT DETECTED NOT DETECTED Final   Serratia marcescens NOT DETECTED NOT DETECTED Final   Haemophilus influenzae NOT DETECTED NOT DETECTED Final   Neisseria meningitidis NOT DETECTED NOT DETECTED Final   Pseudomonas aeruginosa NOT DETECTED NOT DETECTED Final   Candida albicans NOT DETECTED NOT DETECTED Final   Candida glabrata NOT DETECTED NOT DETECTED Final   Candida krusei NOT DETECTED NOT DETECTED Final   Candida parapsilosis NOT DETECTED NOT DETECTED Final   Candida tropicalis NOT DETECTED NOT DETECTED Final  Urine culture     Status: Abnormal   Collection Time: 05/29/16  9:18 PM  Result Value Ref Range Status   Specimen Description URINE, CLEAN CATCH  Final   Special Requests NONE  Final   Culture MULTIPLE SPECIES PRESENT, SUGGEST RECOLLECTION (A)  Final   Report Status 05/31/2016 FINAL  Final  Culture, blood (routine x 2)      Status: None (Preliminary result)   Collection Time: 06/01/16  2:38 PM  Result  Value Ref Range Status   Specimen Description BLOOD BLOOD LEFT ARM  Final   Special Requests BOTTLES DRAWN AEROBIC ONLY 5CC  Final   Culture NO GROWTH < 24 HOURS  Final   Report Status PENDING  Incomplete  Culture, blood (routine x 2)     Status: None (Preliminary result)   Collection Time: 06/01/16  2:45 PM  Result Value Ref Range Status   Specimen Description BLOOD LEFT ANTECUBITAL  Final   Special Requests BOTTLES DRAWN AEROBIC ONLY 5CC  Final   Culture NO GROWTH < 24 HOURS  Final   Report Status PENDING  Incomplete         Radiology Studies: No results found.      Scheduled Meds: . ampicillin (OMNIPEN) IV  2 g Intravenous Q6H  . aspirin EC  81 mg Oral Once per day on Mon Thu  . cholecalciferol  2,000 Units Oral QODAY  . clindamycin (CLEOCIN) IV  600 mg Intravenous Q8H  . docusate sodium  100 mg Oral BID  . enoxaparin (LOVENOX) injection  65 mg Subcutaneous Q24H  . furosemide  20 mg Oral Daily  . furosemide  20 mg Oral Once  . levothyroxine  50 mcg Oral QODAY  . levothyroxine  75 mcg Oral QODAY  . multivitamin with minerals  1 tablet Oral Once per day on Mon Thu  . simvastatin  20 mg Oral Once per day on Mon Wed Fri   Continuous Infusions:    LOS: 4 days    Time spent: 30 minutes    Loretha Stapler, MD [facility] Pager [phone]  If 7PM-7AM, please contact night-coverage [email or web address] Password [facility] 06/02/2016, 3:54 PM

## 2016-06-02 NOTE — Consult Note (Signed)
[facility]  Total days of antibiotics 4        Day 4 ceftriaxone               Reason for Consult: cellulitis with secondary streptococcal bacteremia    Referring Physician: Adair Patter  Principal Problem:   Sepsis ([place]) Active Problems:   Cellulitis   Hypothyroidism   GERD (gastroesophageal reflux disease)    HPI: Matthew Robinson is a 70 y.o. 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 He also had associated N/V/malaise, and encephalopathy in the setting of high fever of 102.61F. He was found to have WBC 11.4, mild elevated LA of 3. He was started on vanco and piptazo. His infectious work up revealed blood cx + strep G in 3 of 4 bottles on 11/17. Repeat blood cx on 11/20 NGTD. HIs abtx have been changed to ceftriaxone 2gm IV daily. His leg has been slow to improve. Initially had worsening erythema spreading up to close inferior knee, marked swelling though he is receiving IVF hydration. MRI does not show any abscess. WBC now to 6 but ID asked to weigh in on his slow to respond to treatment. His left foot is still exquistitely tender, unable to weightbear. He also notices having a cough with deep inspiration  Past Medical History:  Diagnosis Date  . Hyperlipemia   . Melanoma of skin ([place])    left shoulder  . Mitral valve prolapse    predental antibiotics  . Sleep apnea    cpap 16    Allergies: No Known Allergies   MEDICATIONS: . ampicillin (OMNIPEN) IV  2 g Intravenous Q6H  . aspirin EC  81 mg Oral Once per day on Mon Thu  . cholecalciferol  2,000 Units Oral QODAY  . clindamycin (CLEOCIN) IV  600 mg Intravenous Q8H  . docusate sodium  100 mg Oral BID  . enoxaparin (LOVENOX) injection  65 mg Subcutaneous Q24H  . furosemide  20 mg Oral Daily  . levothyroxine  50 mcg Oral  QODAY  . levothyroxine  75 mcg Oral QODAY  . multivitamin with minerals  1 tablet Oral Once per day on Mon Thu  . simvastatin  20 mg Oral Once per day on Mon Wed Fri    Social History  Substance Use Topics  . Smoking status: Never Smoker  . Smokeless tobacco: Never Used  . Alcohol use 0.0 oz/week    1 - 3 Glasses of wine per week     Comment: x2 weekly     History reviewed. No pertinent family history.   Review of Systems  Constitutional: Negative for fever, chills, diaphoresis, activity change, appetite change, fatigue and unexpected weight change.  HENT: Negative for congestion, sore throat, rhinorrhea, sneezing, trouble swallowing and sinus pressure.  Eyes: Negative for photophobia and visual disturbance.  Respiratory: Negative for cough, chest tightness, shortness of breath, wheezing and stridor.  Cardiovascular: Negative for chest pain, palpitations and leg swelling.  Gastrointestinal: Negative for nausea, vomiting, abdominal pain, diarrhea, constipation, blood in stool, abdominal distention and anal bleeding.  Genitourinary: Negative for dysuria, hematuria, flank pain and difficulty urinating.  Musculoskeletal: Negative for myalgias, back pain, joint swelling, arthralgias and gait problem.  Skin: + red, swollen left foot and ankle, calf Neurological: Negative for dizziness, tremors,  weakness and light-headedness.  Hematological: Negative for adenopathy. Does not bruise/bleed easily.  Psychiatric/Behavioral: Negative for behavioral problems, confusion, sleep disturbance, dysphoric mood, decreased concentration and agitation.     OBJECTIVE: Temp:  [98 F (36.7 C)-99.1 F (37.3 C)] 98.8 F (37.1 C) (11/21 0900) Pulse Rate:  [90-97] 93 (11/21 0900) Resp:  [17-20] 20 (11/21 0900) BP: (109-118)/(66-73) 114/68 (11/21 0900) SpO2:  [97 %-98 %] 97 % (11/21 0900) Physical Exam  Constitutional: He is oriented to person, place, and time. He appears well-developed and  well-nourished. No distress. With dry hacking cough HENT:  Mouth/Throat: Oropharynx is clear and moist. No oropharyngeal exudate.  Cardiovascular: Normal rate, regular rhythm and normal heart sounds. Exam reveals no gallop and no friction rub.  No murmur heard.  Pulmonary/Chest: Effort normal and breath sounds normal. No respiratory distress. He has no wheezes.  Abdominal: Soft. Bowel sounds are normal. He exhibits no distension. There is no tenderness.  Lymphadenopathy:  He has no cervical adenopathy.  Neurological: He is alert and oriented to person, place, and time.  Skin: marked erythema to his dorsum of foot, ankle, distal 1/2 of lower leg. Tight sheen due to swelling Psychiatric: He has a normal mood and affect. His behavior is normal.    LABS: Results for orders placed or performed during the hospital encounter of 05/29/16 (from the past 48 hour(s))  CBC     Status: Abnormal   Collection Time: 06/01/16 11:43 AM  Result Value Ref Range   WBC 6.0 4.0 - 10.5 K/uL    Comment: WHITE COUNT CONFIRMED ON SMEAR   RBC 4.14 (L) 4.22 - 5.81 MIL/uL   Hemoglobin 13.0 13.0 - 17.0 g/dL   HCT 38.8 (L) 39.0 - 52.0 %   MCV 93.7 78.0 - 100.0 fL   MCH 31.4 26.0 - 34.0 pg   MCHC 33.5 30.0 - 36.0 g/dL   RDW 14.6 11.5 - 15.5 %   Platelets 127 (L) 150 - 400 K/uL  Culture, blood (routine x 2)     Status: None (Preliminary result)   Collection Time: 06/01/16  2:38 PM  Result Value Ref Range   Specimen Description BLOOD BLOOD LEFT ARM    Special Requests BOTTLES DRAWN AEROBIC ONLY 5CC    Culture NO GROWTH < 24 HOURS    Report Status PENDING   Culture, blood (routine x 2)     Status: None (Preliminary result)   Collection Time: 06/01/16  2:45 PM  Result Value Ref Range   Specimen Description BLOOD LEFT ANTECUBITAL    Special Requests BOTTLES DRAWN AEROBIC ONLY 5CC    Culture NO GROWTH < 24 HOURS    Report Status PENDING     MICRO: 11/20 blood cx ngtd 11/17 strep group B IMAGING: No results  found.   Assessment/Plan: 69yo M wit hcellulitis of left leg plus associated streptococcal group G bacteremia  - recommend to change abtx to clindamycin for 2 days to treat toxin inhibition - will narrow ceftriaxone to ampicillin to help with narrow coverage to treat strep  LE swelling = may have element of fluid overload. Recommend to decrease IVF to 50-68mL/hr  Dry cough = could be also due to pulm edema. Will check CXR    Streptococcal bacteremia = continue with ampicillin. Await repeat blood cx. Pease get TTE if repeat blood cx are +.  Elzie Rings [facility] for [facility]ious Diseases [phone]

## 2016-06-02 NOTE — Care Management Important Message (Signed)
Important Message  Patient Details  Name: Matthew Robinson MRN: [id number] Date of Birth: 09/12/1945   Medicare Important Message Given:  Yes    Orbie Pyo 06/02/2016, 12:34 PM

## 2016-06-02 NOTE — Progress Notes (Signed)
  Echocardiogram 2D Echocardiogram has been performed.  Aggie Cosier 06/02/2016, 4:34 PM

## 2016-06-03 DIAGNOSIS — I420 Dilated cardiomyopathy: Secondary | ICD-10-CM

## 2016-06-03 DIAGNOSIS — I5032 Chronic diastolic (congestive) heart failure: Secondary | ICD-10-CM

## 2016-06-03 DIAGNOSIS — A409 Streptococcal sepsis, unspecified: Principal | ICD-10-CM

## 2016-06-03 LAB — CBC WITH DIFFERENTIAL/PLATELET
BASOS ABS: 0 10*3/uL (ref 0.0–0.1)
BASOS PCT: 0 %
EOS ABS: 0.1 10*3/uL (ref 0.0–0.7)
Eosinophils Relative: 1 %
HCT: 40 % (ref 39.0–52.0)
HEMOGLOBIN: 13.1 g/dL (ref 13.0–17.0)
Lymphocytes Relative: 20 %
Lymphs Abs: 2.2 10*3/uL (ref 0.7–4.0)
MCH: 30.7 pg (ref 26.0–34.0)
MCHC: 32.8 g/dL (ref 30.0–36.0)
MCV: 93.7 fL (ref 78.0–100.0)
MONOS PCT: 9 %
Monocytes Absolute: 1 10*3/uL (ref 0.1–1.0)
NEUTROS PCT: 70 %
Neutro Abs: 7.5 10*3/uL (ref 1.7–7.7)
Platelets: 162 10*3/uL (ref 150–400)
RBC: 4.27 MIL/uL (ref 4.22–5.81)
RDW: 14.4 % (ref 11.5–15.5)
WBC: 10.8 10*3/uL — AB (ref 4.0–10.5)

## 2016-06-03 LAB — BASIC METABOLIC PANEL
ANION GAP: 9 (ref 5–15)
BUN: 6 mg/dL (ref 6–20)
CHLORIDE: 105 mmol/L (ref 101–111)
CO2: 24 mmol/L (ref 22–32)
CREATININE: 0.92 mg/dL (ref 0.61–1.24)
Calcium: 8.3 mg/dL — ABNORMAL LOW (ref 8.9–10.3)
GFR calc non Af Amer: >60 mL/min (ref >60)
Glucose, Bld: 122 mg/dL — ABNORMAL HIGH (ref 65–99)
Potassium: 3.5 mmol/L (ref 3.5–5.1)
SODIUM: 138 mmol/L (ref 135–145)

## 2016-06-03 MED ORDER — FUROSEMIDE 10 MG/ML IJ SOLN
40.0000 mg | Freq: Two times a day (BID) | INTRAMUSCULAR | Status: DC
  Administered 2016-06-04 – 2016-06-07 (×7): 40 mg via INTRAVENOUS
  Filled 2016-06-03 (×7): qty 4

## 2016-06-03 MED ORDER — FUROSEMIDE 10 MG/ML IJ SOLN
60.0000 mg | Freq: Once | INTRAMUSCULAR | Status: AC
  Administered 2016-06-03: 60 mg via INTRAVENOUS
  Filled 2016-06-03: qty 6

## 2016-06-03 NOTE — Progress Notes (Addendum)
PROGRESS NOTE    Matthew Robinson  [id number] DOB: [birth year] DOA: 05/29/2016 PCP: Wenda Low, MD     Brief Narrative:  70 y.o.WM PMHx HLD, MV prolapse, OSA on CPAP (16), Hypothyroidism  Who is being admitted to the hospital this evening due to sepsis from cellulitis. The patient is somewhat confused, history taken from the patient as well as his wife who is at the bedside in the ER this evening. It seems that over the last few days, the patient has had increasing swelling in his left lower extremity, this has caused what he perceived to be balance problems, and he has also been feeling somewhat short of breath the last few days. Today he went swimming, and according to his wife he says he was not able to swim as much as usual and when he came home he was behaving somewhat strangely and acting mildly confused. Due to his complaints of shortness of breath, and his confusion, his wife took him to urgent care from where he was referred to the emergency department. He denies having any chest pain, nausea, vomiting, fevers, chills. Denies any injury or cuts on his skin especially his lower cavity. He does have a history of peripheral edema for which she takes when necessary oral Lasix, but this first time that he has had unilateral edema.   Subjective: 11/22  A/O 4,  last 24 hours afebrile   Assessment & Plan:   Principal Problem:   Sepsis ([place]) Active Problems:   Cellulitis   Hypothyroidism   GERD (gastroesophageal reflux disease)   Cellulitis positive strep group G - Empiric IV antibiotics including IV Zosyn and IV vancomycin - Rocephin transitioned to Ampicillin and Clindamycin today - repeat blood cultures NGTD - TTE: CHF see results below ordered - No evidence of deeper infection such as abscess or osteomyelitis per MRI - continue to monitor      Chronic diastolic CHF/Dilated Cardiomyopathy -11/22 Lasix IV 60 mg 1  -Strict I&O -Daily weight Filed Weights   05/29/16 2319 05/30/16 2105 05/31/16 2220  Weight: 131.1 kg (289 lb) 131.5 kg (289 lb 14.5 oz) 132 kg (291 lb 0.1 oz)  -Lasix IV 40 mg BID -Patient had a cardiologist in distant past for mitral valve prolapse but currently no cardiologist. Will contact cardiology on 11/23, to tie patient back into system -BP won't tolerate additional CHF medication.    DVT prophylaxis: Lovenox Code Status: Full Family Communication: Wife at bedside Disposition Plan: Resolution cellulitis   Consultants:  Dr. Carlyle Basques ID  Procedures/Significant Events:  11/21 Echocardiogram: LVEF=: 55% to 60%. -- Right ventricle: moderately dilated.  -- Right atrium:  moderately to severely dilated.    VENTILATOR SETTINGS:    Cultures 11/17 blood right forearm/hand positive strep group G 11/17 urine positive multiple species 11/20 blood 2 NGTD   Antimicrobials: Ampicillin 11/21>> Ceftriaxone 11/17>> 11/21 Clindamycin 11/21>> Zosyn 11/17>> 11/18 Vancomycin 11/17>> 11/18    Devices None   LINES / TUBES:      Continuous Infusions:   Objective: Vitals:   06/02/16 1744 06/02/16 1937 06/02/16 2059 06/03/16 0507  BP: 124/71  113/72 107/62  Pulse: 88  95 (!) 101  Resp: 18  18 19   Temp: 98.6 F (37 C) 99.2 F (37.3 C) 100 F (37.8 C) 98.6 F (37 C)  TempSrc: Oral Oral Oral Oral  SpO2: 96%  95% 97%  Weight:      Height:        Intake/Output Summary (Last 24 hours) at  06/03/16 0839 Last data filed at 06/03/16 [id number]  Gross per 24 hour  Intake             1210 ml  Output             1360 ml  Net             -150 ml   Filed Weights   05/29/16 2319 05/30/16 2105 05/31/16 2220  Weight: 131.1 kg (289 lb) 131.5 kg (289 lb 14.5 oz) 132 kg (291 lb 0.1 oz)    Examination:  General: A/O 4, positive LLE pain, No acute respiratory distress Eyes: negative scleral hemorrhage, negative anisocoria, negative icterus ENT: Negative Runny nose, negative gingival bleeding, Neck:  Negative  scars, masses, torticollis, lymphadenopathy, JVD Lungs: Clear to auscultation bilaterally without wheezes or crackles Cardiovascular: Regular rate and rhythm without murmur gallop or rub normal S1 and S2 Abdomen: negative abdominal pain, nondistended, positive soft, bowel sounds, no rebound, no ascites, no appreciable mass Extremities: No significant cyanosis, clubbing. Bilateral lower extremity edema Lt >> Rt. LLE erythema tenderness blisters swelling see photo Psychiatric:  Negative depression, positive anxiety, negative fatigue, negative mania  Central nervous system:  Cranial nerves II through XII intact, tongue/uvula midline, all extremities muscle strength 5/5, sensation intact throughout,  negative dysarthria, negative expressive aphasia, negative receptive aphasia.  .     Data Reviewed: Care during the described time interval was provided by me .  I have reviewed this patient's available data, including medical history, events of note, physical examination, and all test results as part of my evaluation. I have personally reviewed and interpreted all radiology studies.  CBC:  Recent Labs Lab 05/29/16 1641 05/29/16 1957 05/30/16 0354 06/01/16 1143 06/03/16 0535  WBC 11.9* 11.5* 11.4* 6.0 10.8*  NEUTROABS  --  10.2*  --   --  7.5  HGB 16.1 15.2 14.0 13.0 13.1  HCT 46.7 44.6 42.9 38.8* 40.0  MCV 91.2 92.9 93.9 93.7 93.7  PLT  --  138* 131* 127* 0000000   Basic Metabolic Panel:  Recent Labs Lab 05/29/16 1620 05/29/16 1957 05/30/16 0354 06/03/16 0535  NA 139 136 136 138  K 4.1 3.9 4.1 3.5  CL 103 102 103 105  CO2 24 23 24 24   GLUCOSE 127* 134* 144* 122*  BUN 16 13 13 6   CREATININE 0.74 0.99 1.15 0.92  CALCIUM 9.9 9.7 8.4* 8.3*   GFR: Estimated Creatinine Clearance: 109.1 mL/min (by C-G formula based on SCr of 0.92 mg/dL). Liver Function Tests: No results for input(s): AST, ALT, ALKPHOS, BILITOT, PROT, ALBUMIN in the last 168 hours. No results for input(s): LIPASE,  AMYLASE in the last 168 hours. No results for input(s): AMMONIA in the last 168 hours. Coagulation Profile: No results for input(s): INR, PROTIME in the last 168 hours. Cardiac Enzymes: No results for input(s): CKTOTAL, CKMB, CKMBINDEX, TROPONINI in the last 168 hours. BNP (last 3 results) No results for input(s): PROBNP in the last 8760 hours. HbA1C: No results for input(s): HGBA1C in the last 72 hours. CBG: No results for input(s): GLUCAP in the last 168 hours. Lipid Profile: No results for input(s): CHOL, HDL, LDLCALC, TRIG, CHOLHDL, LDLDIRECT in the last 72 hours. Thyroid Function Tests: No results for input(s): TSH, T4TOTAL, FREET4, T3FREE, THYROIDAB in the last 72 hours. Anemia Panel: No results for input(s): VITAMINB12, FOLATE, FERRITIN, TIBC, IRON, RETICCTPCT in the last 72 hours. Sepsis Labs:  Recent Labs Lab 05/29/16 2159 05/30/16 0045 05/30/16 0354 05/31/16 1031  LATICACIDVEN  2.94* 2.3* 2.2* 2.0*    Recent Results (from the past 240 hour(s))  Blood culture (routine x 2)     Status: Abnormal   Collection Time: 05/29/16  7:57 PM  Result Value Ref Range Status   Specimen Description BLOOD RIGHT FOREARM  Final   Special Requests BOTTLES DRAWN AEROBIC AND ANAEROBIC 5CC  Final   Culture  Setup Time   Final    GRAM POSITIVE COCCI IN CHAINS IN BOTH AEROBIC AND ANAEROBIC BOTTLES CRITICAL RESULT CALLED TO, READ BACK BY AND VERIFIED WITH: A. MASTERS, PHARM AT 1135 ON [id number] BY Rhea Bleacher    Culture (A)  Final    STREPTOCOCCUS GROUP G SUSCEPTIBILITIES PERFORMED ON PREVIOUS CULTURE WITHIN THE LAST 5 DAYS.    Report Status 06/01/2016 FINAL  Final  Blood culture (routine x 2)     Status: Abnormal   Collection Time: 05/29/16  8:15 PM  Result Value Ref Range Status   Specimen Description BLOOD RIGHT HAND  Final   Special Requests BOTTLES DRAWN AEROBIC AND ANAEROBIC 5CC  Final   Culture  Setup Time   Final    GRAM POSITIVE COCCI IN CHAINS AEROBIC BOTTLE ONLY CRITICAL  RESULT CALLED TO, READ BACK BY AND VERIFIED WITH: A. MASTERS, PHARM AT [id number] ON [id number] BY Rhea Bleacher    Culture STREPTOCOCCUS GROUP G (A)  Final   Report Status 06/01/2016 FINAL  Final   Organism ID, Bacteria STREPTOCOCCUS GROUP G  Final      Susceptibility   Streptococcus group g - MIC*    CLINDAMYCIN <=0.25 SENSITIVE Sensitive     AMPICILLIN <=0.25 SENSITIVE Sensitive     ERYTHROMYCIN <=0.12 SENSITIVE Sensitive     VANCOMYCIN 0.5 SENSITIVE Sensitive     CEFTRIAXONE <=0.12 SENSITIVE Sensitive     LEVOFLOXACIN 1 SENSITIVE Sensitive     * STREPTOCOCCUS GROUP G  Blood Culture ID Panel (Reflexed)     Status: Abnormal   Collection Time: 05/29/16  8:15 PM  Result Value Ref Range Status   Enterococcus species NOT DETECTED NOT DETECTED Final   Listeria monocytogenes NOT DETECTED NOT DETECTED Final   Staphylococcus species NOT DETECTED NOT DETECTED Final   Staphylococcus aureus NOT DETECTED NOT DETECTED Final   Streptococcus species DETECTED (A) NOT DETECTED Final    Comment: CRITICAL RESULT CALLED TO, READ BACK BY AND VERIFIED WITH: A MASTERS,PHARMD AT 1135 05/30/16 BY S YARBROUGH    Streptococcus agalactiae NOT DETECTED NOT DETECTED Final   Streptococcus pneumoniae NOT DETECTED NOT DETECTED Final   Streptococcus pyogenes NOT DETECTED NOT DETECTED Final   Acinetobacter baumannii NOT DETECTED NOT DETECTED Final   Enterobacteriaceae species NOT DETECTED NOT DETECTED Final   Enterobacter cloacae complex NOT DETECTED NOT DETECTED Final   Escherichia coli NOT DETECTED NOT DETECTED Final   Klebsiella oxytoca NOT DETECTED NOT DETECTED Final   Klebsiella pneumoniae NOT DETECTED NOT DETECTED Final   Proteus species NOT DETECTED NOT DETECTED Final   Serratia marcescens NOT DETECTED NOT DETECTED Final   Haemophilus influenzae NOT DETECTED NOT DETECTED Final   Neisseria meningitidis NOT DETECTED NOT DETECTED Final   Pseudomonas aeruginosa NOT DETECTED NOT DETECTED Final   Candida albicans NOT  DETECTED NOT DETECTED Final   Candida glabrata NOT DETECTED NOT DETECTED Final   Candida krusei NOT DETECTED NOT DETECTED Final   Candida parapsilosis NOT DETECTED NOT DETECTED Final   Candida tropicalis NOT DETECTED NOT DETECTED Final  Urine culture     Status: Abnormal   Collection Time:  05/29/16  9:18 PM  Result Value Ref Range Status   Specimen Description URINE, CLEAN CATCH  Final   Special Requests NONE  Final   Culture MULTIPLE SPECIES PRESENT, SUGGEST RECOLLECTION (A)  Final   Report Status 05/31/2016 FINAL  Final  Culture, blood (routine x 2)     Status: None (Preliminary result)   Collection Time: 06/01/16  2:38 PM  Result Value Ref Range Status   Specimen Description BLOOD BLOOD LEFT ARM  Final   Special Requests BOTTLES DRAWN AEROBIC ONLY 5CC  Final   Culture NO GROWTH < 24 HOURS  Final   Report Status PENDING  Incomplete  Culture, blood (routine x 2)     Status: None (Preliminary result)   Collection Time: 06/01/16  2:45 PM  Result Value Ref Range Status   Specimen Description BLOOD LEFT ANTECUBITAL  Final   Special Requests BOTTLES DRAWN AEROBIC ONLY 5CC  Final   Culture NO GROWTH < 24 HOURS  Final   Report Status PENDING  Incomplete         Radiology Studies: Dg Chest 1 View  Result Date: 06/02/2016 CLINICAL DATA:  Cough for 3 days, shortness of Breath EXAM: CHEST 1 VIEW COMPARISON:  05/29/2016 FINDINGS: Borderline cardiomegaly. No infiltrate or pulmonary edema. Mild basilar atelectasis. IMPRESSION: Borderline cardiomegaly. No infiltrate or pulmonary edema. Mild basilar atelectasis. Electronically Signed   By: Lahoma Crocker M.D.   On: 06/02/2016 18:34        Scheduled Meds: . ampicillin (OMNIPEN) IV  2 g Intravenous Q6H  . aspirin EC  81 mg Oral Once per day on Mon Thu  . cholecalciferol  2,000 Units Oral QODAY  . clindamycin (CLEOCIN) IV  600 mg Intravenous Q8H  . docusate sodium  100 mg Oral BID  . enoxaparin (LOVENOX) injection  65 mg Subcutaneous Q24H    . furosemide  20 mg Oral Daily  . levothyroxine  50 mcg Oral QODAY  . levothyroxine  75 mcg Oral QODAY  . multivitamin with minerals  1 tablet Oral Once per day on Mon Thu  . simvastatin  20 mg Oral Once per day on Mon Wed Fri   Continuous Infusions:   LOS: 5 days    Time spent:40 min    Damichael Hofman, Geraldo Docker, MD [facility] Pager [phone]  If 7PM-7AM, please contact night-coverage [email or web address] Password [facility] 06/03/2016, 8:39 AM

## 2016-06-03 NOTE — Progress Notes (Signed)
    [name] for Infectious Disease    Date of Admission:  05/29/2016   Total days of antibiotics 5        Day 2 clindamycin/ampicillin          ID: Matthew Robinson is a 70 y.o. male with right leg cellulitis Principal Problem:   Sepsis ([place]) Active Problems:   Cellulitis   Hypothyroidism   GERD (gastroesophageal reflux disease)    Subjective: Right leg increasing redness, TTE shows no vegetation but has diastolic failure with EF [id number]. He is able to bear weight on right leg. Not responding as well to oral lasix only 356mL UOP thus far  Medications:  . ampicillin (OMNIPEN) IV  2 g Intravenous Q6H  . aspirin EC  81 mg Oral Once per day on Mon Thu  . cholecalciferol  2,000 Units Oral QODAY  . clindamycin (CLEOCIN) IV  600 mg Intravenous Q8H  . docusate sodium  100 mg Oral BID  . enoxaparin (LOVENOX) injection  65 mg Subcutaneous Q24H  . furosemide  60 mg Intravenous Once  . furosemide  20 mg Oral Daily  . levothyroxine  50 mcg Oral QODAY  . levothyroxine  75 mcg Oral QODAY  . multivitamin with minerals  1 tablet Oral Once per day on Mon Thu  . simvastatin  20 mg Oral Once per day on Mon Wed Fri    Objective: Vital signs in last 24 hours: Temp:  [98.6 F (37 C)-100 F (37.8 C)] 99.3 F (37.4 C) (11/22 0901) Pulse Rate:  [88-120] 120 (11/22 0901) Resp:  [18-19] 18 (11/22 0901) BP: (107-124)/(62-72) 113/66 (11/22 0901) SpO2:  [94 %-97 %] 94 % (11/22 0901)  gen = A x O By 3 in NAD, cheeked are flush HEENT = oral mucosa pulm = CTAB, mild inspiratory crackles on right Cards = Nl s1,s2 no g/m/r Skin = deep red erythema extending above line Ext: +2 edema to dorsum of foot,pre tibial  Lab Results  Recent Labs  06/01/16 1143 06/03/16 0535  WBC 6.0 10.8*  HGB 13.0 13.1  HCT 38.8* 40.0  NA  --  138  K  --  3.5  CL  --  105  CO2  --  24  BUN  --  6  CREATININE  --  0.92    Microbiology: 11/20 blood cx ngtd 11/17 blood cx  Streptococcal  g Studies/Results: Dg Chest 1 View  Result Date: 06/02/2016 CLINICAL DATA:  Cough for 3 days, shortness of Breath EXAM: CHEST 1 VIEW COMPARISON:  05/29/2016 FINDINGS: Borderline cardiomegaly. No infiltrate or pulmonary edema. Mild basilar atelectasis. IMPRESSION: Borderline cardiomegaly. No infiltrate or pulmonary edema. Mild basilar atelectasis. Electronically Signed   By: Lahoma Crocker M.D.   On: 06/02/2016 18:34     Assessment/Plan: 70 yo M with cellulitis and associated streptococcal bacteremia - continue with ampicillin and clindamycin. If too difficult to diurese, can switch to beta lactam with less frequent dosign - cellulitis is still quite red, extended but also wonder if he would benefit from more aggressive diuresis  Dr Sherral Hammers agree, he is increasing diuretic dose  Mild chf = would benefit from diuretics  Pleuritic cough = probably element of pulmonary edema  Dr Lucianne Lei dam to see tomorrow  Baxter Flattery [facility] for Infectious Diseases Cell: [phone] Pager: [phone]  06/03/2016, 4:48 PM

## 2016-06-04 ENCOUNTER — Inpatient Hospital Stay (HOSPITAL_COMMUNITY): Payer: Medicare Other

## 2016-06-04 DIAGNOSIS — A401 Sepsis due to streptococcus, group B: Secondary | ICD-10-CM | POA: Diagnosis present

## 2016-06-04 DIAGNOSIS — R059 Cough, unspecified: Secondary | ICD-10-CM | POA: Diagnosis present

## 2016-06-04 DIAGNOSIS — L03116 Cellulitis of left lower limb: Secondary | ICD-10-CM | POA: Diagnosis present

## 2016-06-04 DIAGNOSIS — I50812 Chronic right heart failure: Secondary | ICD-10-CM | POA: Diagnosis present

## 2016-06-04 DIAGNOSIS — B954 Other streptococcus as the cause of diseases classified elsewhere: Secondary | ICD-10-CM

## 2016-06-04 DIAGNOSIS — R05 Cough: Secondary | ICD-10-CM

## 2016-06-04 LAB — CBC WITH DIFFERENTIAL/PLATELET
BASOS PCT: 0 %
Basophils Absolute: 0 10*3/uL (ref 0.0–0.1)
EOS ABS: 0.3 10*3/uL (ref 0.0–0.7)
EOS PCT: 3 %
HCT: 37 % — ABNORMAL LOW (ref 39.0–52.0)
HEMOGLOBIN: 12.2 g/dL — AB (ref 13.0–17.0)
LYMPHS PCT: 22 %
Lymphs Abs: 2 10*3/uL (ref 0.7–4.0)
MCH: 30.7 pg (ref 26.0–34.0)
MCHC: 33 g/dL (ref 30.0–36.0)
MCV: 93 fL (ref 78.0–100.0)
Monocytes Absolute: 0.5 10*3/uL (ref 0.1–1.0)
Monocytes Relative: 6 %
NEUTROS PCT: 69 %
Neutro Abs: 6.2 10*3/uL (ref 1.7–7.7)
Platelets: 168 10*3/uL (ref 150–400)
RBC: 3.98 MIL/uL — AB (ref 4.22–5.81)
RDW: 14.4 % (ref 11.5–15.5)
WBC: 9 10*3/uL (ref 4.0–10.5)

## 2016-06-04 LAB — LACTIC ACID, PLASMA: LACTIC ACID, VENOUS: 1.1 mmol/L (ref 0.5–1.9)

## 2016-06-04 LAB — BASIC METABOLIC PANEL
ANION GAP: 8 (ref 5–15)
BUN: 9 mg/dL (ref 6–20)
CO2: 27 mmol/L (ref 22–32)
Calcium: 8.2 mg/dL — ABNORMAL LOW (ref 8.9–10.3)
Chloride: 102 mmol/L (ref 101–111)
Creatinine, Ser: 0.93 mg/dL (ref 0.61–1.24)
GFR calc Af Amer: >60 mL/min (ref >60)
GLUCOSE: 117 mg/dL — AB (ref 65–99)
POTASSIUM: 3.6 mmol/L (ref 3.5–5.1)
Sodium: 137 mmol/L (ref 135–145)

## 2016-06-04 LAB — MAGNESIUM: Magnesium: 1.9 mg/dL (ref 1.7–2.4)

## 2016-06-04 MED ORDER — IPRATROPIUM-ALBUTEROL 0.5-2.5 (3) MG/3ML IN SOLN
3.0000 mL | Freq: Once | RESPIRATORY_TRACT | Status: AC
  Administered 2016-06-04: 3 mL via RESPIRATORY_TRACT
  Filled 2016-06-04: qty 3

## 2016-06-04 MED ORDER — HYDROCOD POLST-CPM POLST ER 10-8 MG/5ML PO SUER
5.0000 mL | Freq: Every evening | ORAL | Status: DC | PRN
  Administered 2016-06-04: 5 mL via ORAL
  Filled 2016-06-04: qty 5

## 2016-06-04 NOTE — Progress Notes (Signed)
PROGRESS NOTE    Matthew Robinson  [id number] DOB: [birth year] DOA: 05/29/2016 PCP: Wenda Low, MD     Brief Narrative:  70 y.o.WM PMHx HLD, MV prolapse, OSA on CPAP (16), Hypothyroidism  Who is being admitted to the hospital this evening due to sepsis from cellulitis. The patient is somewhat confused, history taken from the patient as well as his wife who is at the bedside in the ER this evening. It seems that over the last few days, the patient has had increasing swelling in his left lower extremity, this has caused what he perceived to be balance problems, and he has also been feeling somewhat short of breath the last few days. Today he went swimming, and according to his wife he says he was not able to swim as much as usual and when he came home he was behaving somewhat strangely and acting mildly confused. Due to his complaints of shortness of breath, and his confusion, his wife took him to urgent care from where he was referred to the emergency department. He denies having any chest pain, nausea, vomiting, fevers, chills. Denies any injury or cuts on his skin especially his lower cavity. He does have a history of peripheral edema for which she takes when necessary oral Lasix, but this first time that he has had unilateral edema.   Subjective: 11/23  A/O 4,  last 24 hours afebrile   Assessment & Plan:   Principal Problem:   Sepsis ([place]) Active Problems:   Cellulitis   Hypothyroidism   GERD (gastroesophageal reflux disease)   Sepsis due to Streptococcus agalactiae (HCC)   Cellulitis of left leg   Chronic diastolic CHF (congestive heart failure) (HCC)   Dilated cardiomyopathy (HCC)   Cellulitis positive strep group G - Empiric IV antibiotics including IV Zosyn and IV vancomycin - Rocephin transitioned to Ampicillin and Clindamycin today - repeat blood cultures NGTD - TTE: CHF see results below ordered - No evidence of deeper infection such as abscess or osteomyelitis  per MRI - continue to monitor      Chronic diastolic CHF/Dilated Cardiomyopathy -11/22 Lasix IV 60 mg 1  -Strict I&O since admission +2.8 L -Daily weight Filed Weights   05/29/16 2319 05/30/16 2105 05/31/16 2220  Weight: 131.1 kg (289 lb) 131.5 kg (289 lb 14.5 oz) 132 kg (291 lb 0.1 oz)  -Lasix IV 40 mg BID -Patient had a cardiologist in distant past for mitral valve prolapse but currently no cardiologist. Will contact cardiology on 11/24, to tie patient back into system -BP won't tolerate additional CHF medication.  Wheezing/cough -Tests and ask  QHS PRN -DuoNeb 1 -PCXR: Pulmonary edema, vs viral pneumonia. Given patient's leg and recent inactivity PE would be on the DDx but unlikely. However If patient starts to become hypoxic will need to obtain CTA    DVT prophylaxis: Lovenox Code Status: Full Family Communication: Wife at bedside Disposition Plan: Resolution cellulitis   Consultants:  Dr. Carlyle Basques ID  Procedures/Significant Events:  11/21 Echocardiogram: LVEF=: 55% to 60%. -- Right ventricle: moderately dilated.  -- Right atrium:  moderately to severely dilated.    VENTILATOR SETTINGS:    Cultures 11/17 blood right forearm/hand positive strep group G 11/17 urine positive multiple species 11/20 blood 2 NGTD   Antimicrobials: Ampicillin 11/21>> Ceftriaxone 11/17>> 11/21 Clindamycin 11/21>> Zosyn 11/17>> 11/18 Vancomycin 11/17>> 11/18    Devices None   LINES / TUBES:      Continuous Infusions:   Objective: Vitals:   06/03/16 0901 06/03/16  1753 06/03/16 2047 06/04/16 0520  BP: 113/66 (!) 109/58 134/68 129/64  Pulse: (!) 120 83 76 69  Resp: 18 18 18 18   Temp: 99.3 F (37.4 C) 99 F (37.2 C) 99.4 F (37.4 C) 97.7 F (36.5 C)  TempSrc: Oral Oral  Oral  SpO2: 94% 97% 97% 97%  Weight:      Height:        Intake/Output Summary (Last 24 hours) at 06/04/16 0749 Last data filed at 06/04/16 0600  Gross per 24 hour  Intake              1360 ml  Output             2025 ml  Net             -665 ml   Filed Weights   05/29/16 2319 05/30/16 2105 05/31/16 2220  Weight: 131.1 kg (289 lb) 131.5 kg (289 lb 14.5 oz) 132 kg (291 lb 0.1 oz)    Examination:  General: A/O 4, positive LLE painImproved from 11/23, No acute respiratory distress Eyes: negative scleral hemorrhage, negative anisocoria, negative icterus ENT: Negative Runny nose, negative gingival bleeding, Neck:  Negative scars, masses, torticollis, lymphadenopathy, JVD Lungs: Clear to auscultation bilaterally positive mild expiratory wheezes negative crackles Cardiovascular: Regular rate and rhythm without murmur gallop or rub normal S1 and S2 Abdomen: negative abdominal pain, nondistended, positive soft, bowel sounds, no rebound, no ascites, no appreciable mass Extremities: No significant cyanosis, clubbing. Bilateral lower extremity edema Lt >> Rt. LLE erythema tenderness blisters swelling see photo: 11/23 note improved Psychiatric:  Negative depression, positive anxiety, negative fatigue, negative mania  Central nervous system:  Cranial nerves II through XII intact, tongue/uvula midline, all extremities muscle strength 5/5, sensation intact throughout,  negative dysarthria, negative expressive aphasia, negative receptive aphasia.  .     Data Reviewed: Care during the described time interval was provided by me .  I have reviewed this patient's available data, including medical history, events of note, physical examination, and all test results as part of my evaluation. I have personally reviewed and interpreted all radiology studies.  CBC:  Recent Labs Lab 05/29/16 1957 05/30/16 0354 06/01/16 1143 06/03/16 0535 06/04/16 0549  WBC 11.5* 11.4* 6.0 10.8* 9.0  NEUTROABS 10.2*  --   --  7.5 PENDING  HGB 15.2 14.0 13.0 13.1 12.2*  HCT 44.6 42.9 38.8* 40.0 37.0*  MCV 92.9 93.9 93.7 93.7 93.0  PLT 138* 131* 127* 162 [id number]   Basic Metabolic Panel:  Recent  Labs Lab 05/29/16 1620 05/29/16 1957 05/30/16 0354 06/03/16 0535 06/04/16 0549  NA 139 136 136 138 137  K 4.1 3.9 4.1 3.5 3.6  CL 103 102 103 105 102  CO2 24 23 24 24 27   GLUCOSE 127* 134* 144* 122* 117*  BUN 16 13 13 6 9   CREATININE 0.74 0.99 1.15 0.92 0.93  CALCIUM 9.9 9.7 8.4* 8.3* 8.2*  MG  --   --   --   --  1.9   GFR: Estimated Creatinine Clearance: 108 mL/min (by C-G formula based on SCr of 0.93 mg/dL). Liver Function Tests: No results for input(s): AST, ALT, ALKPHOS, BILITOT, PROT, ALBUMIN in the last 168 hours. No results for input(s): LIPASE, AMYLASE in the last 168 hours. No results for input(s): AMMONIA in the last 168 hours. Coagulation Profile: No results for input(s): INR, PROTIME in the last 168 hours. Cardiac Enzymes: No results for input(s): CKTOTAL, CKMB, CKMBINDEX, TROPONINI in the last 168  hours. BNP (last 3 results) No results for input(s): PROBNP in the last 8760 hours. HbA1C: No results for input(s): HGBA1C in the last 72 hours. CBG: No results for input(s): GLUCAP in the last 168 hours. Lipid Profile: No results for input(s): CHOL, HDL, LDLCALC, TRIG, CHOLHDL, LDLDIRECT in the last 72 hours. Thyroid Function Tests: No results for input(s): TSH, T4TOTAL, FREET4, T3FREE, THYROIDAB in the last 72 hours. Anemia Panel: No results for input(s): VITAMINB12, FOLATE, FERRITIN, TIBC, IRON, RETICCTPCT in the last 72 hours. Sepsis Labs:  Recent Labs Lab 05/30/16 0045 05/30/16 0354 05/31/16 1031 06/04/16 0549  LATICACIDVEN 2.3* 2.2* 2.0* 1.1    Recent Results (from the past 240 hour(s))  Blood culture (routine x 2)     Status: Abnormal   Collection Time: 05/29/16  7:57 PM  Result Value Ref Range Status   Specimen Description BLOOD RIGHT FOREARM  Final   Special Requests BOTTLES DRAWN AEROBIC AND ANAEROBIC 5CC  Final   Culture  Setup Time   Final    GRAM POSITIVE COCCI IN CHAINS IN BOTH AEROBIC AND ANAEROBIC BOTTLES CRITICAL RESULT CALLED TO, READ  BACK BY AND VERIFIED WITH: A. MASTERS, PHARM AT 1135 ON [id number] BY Rhea Bleacher    Culture (A)  Final    STREPTOCOCCUS GROUP G SUSCEPTIBILITIES PERFORMED ON PREVIOUS CULTURE WITHIN THE LAST 5 DAYS.    Report Status 06/01/2016 FINAL  Final  Blood culture (routine x 2)     Status: Abnormal   Collection Time: 05/29/16  8:15 PM  Result Value Ref Range Status   Specimen Description BLOOD RIGHT HAND  Final   Special Requests BOTTLES DRAWN AEROBIC AND ANAEROBIC 5CC  Final   Culture  Setup Time   Final    GRAM POSITIVE COCCI IN CHAINS AEROBIC BOTTLE ONLY CRITICAL RESULT CALLED TO, READ BACK BY AND VERIFIED WITH: A. MASTERS, PHARM AT [id number] ON [id number] BY Rhea Bleacher    Culture STREPTOCOCCUS GROUP G (A)  Final   Report Status 06/01/2016 FINAL  Final   Organism ID, Bacteria STREPTOCOCCUS GROUP G  Final      Susceptibility   Streptococcus group g - MIC*    CLINDAMYCIN <=0.25 SENSITIVE Sensitive     AMPICILLIN <=0.25 SENSITIVE Sensitive     ERYTHROMYCIN <=0.12 SENSITIVE Sensitive     VANCOMYCIN 0.5 SENSITIVE Sensitive     CEFTRIAXONE <=0.12 SENSITIVE Sensitive     LEVOFLOXACIN 1 SENSITIVE Sensitive     * STREPTOCOCCUS GROUP G  Blood Culture ID Panel (Reflexed)     Status: Abnormal   Collection Time: 05/29/16  8:15 PM  Result Value Ref Range Status   Enterococcus species NOT DETECTED NOT DETECTED Final   Listeria monocytogenes NOT DETECTED NOT DETECTED Final   Staphylococcus species NOT DETECTED NOT DETECTED Final   Staphylococcus aureus NOT DETECTED NOT DETECTED Final   Streptococcus species DETECTED (A) NOT DETECTED Final    Comment: CRITICAL RESULT CALLED TO, READ BACK BY AND VERIFIED WITH: A MASTERS,PHARMD AT 1135 05/30/16 BY S YARBROUGH    Streptococcus agalactiae NOT DETECTED NOT DETECTED Final   Streptococcus pneumoniae NOT DETECTED NOT DETECTED Final   Streptococcus pyogenes NOT DETECTED NOT DETECTED Final   Acinetobacter baumannii NOT DETECTED NOT DETECTED Final    Enterobacteriaceae species NOT DETECTED NOT DETECTED Final   Enterobacter cloacae complex NOT DETECTED NOT DETECTED Final   Escherichia coli NOT DETECTED NOT DETECTED Final   Klebsiella oxytoca NOT DETECTED NOT DETECTED Final   Klebsiella pneumoniae NOT DETECTED NOT DETECTED  Final   Proteus species NOT DETECTED NOT DETECTED Final   Serratia marcescens NOT DETECTED NOT DETECTED Final   Haemophilus influenzae NOT DETECTED NOT DETECTED Final   Neisseria meningitidis NOT DETECTED NOT DETECTED Final   Pseudomonas aeruginosa NOT DETECTED NOT DETECTED Final   Candida albicans NOT DETECTED NOT DETECTED Final   Candida glabrata NOT DETECTED NOT DETECTED Final   Candida krusei NOT DETECTED NOT DETECTED Final   Candida parapsilosis NOT DETECTED NOT DETECTED Final   Candida tropicalis NOT DETECTED NOT DETECTED Final  Urine culture     Status: Abnormal   Collection Time: 05/29/16  9:18 PM  Result Value Ref Range Status   Specimen Description URINE, CLEAN CATCH  Final   Special Requests NONE  Final   Culture MULTIPLE SPECIES PRESENT, SUGGEST RECOLLECTION (A)  Final   Report Status 05/31/2016 FINAL  Final  Culture, blood (routine x 2)     Status: None (Preliminary result)   Collection Time: 06/01/16  2:38 PM  Result Value Ref Range Status   Specimen Description BLOOD BLOOD LEFT ARM  Final   Special Requests BOTTLES DRAWN AEROBIC ONLY 5CC  Final   Culture NO GROWTH 2 DAYS  Final   Report Status PENDING  Incomplete  Culture, blood (routine x 2)     Status: None (Preliminary result)   Collection Time: 06/01/16  2:45 PM  Result Value Ref Range Status   Specimen Description BLOOD LEFT ANTECUBITAL  Final   Special Requests BOTTLES DRAWN AEROBIC ONLY 5CC  Final   Culture NO GROWTH 2 DAYS  Final   Report Status PENDING  Incomplete         Radiology Studies: Dg Chest 1 View  Result Date: 06/02/2016 CLINICAL DATA:  Cough for 3 days, shortness of Breath EXAM: CHEST 1 VIEW COMPARISON:  05/29/2016  FINDINGS: Borderline cardiomegaly. No infiltrate or pulmonary edema. Mild basilar atelectasis. IMPRESSION: Borderline cardiomegaly. No infiltrate or pulmonary edema. Mild basilar atelectasis. Electronically Signed   By: Lahoma Crocker M.D.   On: 06/02/2016 18:34        Scheduled Meds: . ampicillin (OMNIPEN) IV  2 g Intravenous Q6H  . aspirin EC  81 mg Oral Once per day on Mon Thu  . cholecalciferol  2,000 Units Oral QODAY  . clindamycin (CLEOCIN) IV  600 mg Intravenous Q8H  . docusate sodium  100 mg Oral BID  . enoxaparin (LOVENOX) injection  65 mg Subcutaneous Q24H  . furosemide  40 mg Intravenous BID  . levothyroxine  50 mcg Oral QODAY  . levothyroxine  75 mcg Oral QODAY  . multivitamin with minerals  1 tablet Oral Once per day on Mon Thu  . simvastatin  20 mg Oral Once per day on Mon Wed Fri   Continuous Infusions:   LOS: 6 days    Time spent:40 min    Amiri Riechers, Geraldo Docker, MD [facility] Pager [phone]  If 7PM-7AM, please contact night-coverage [email or web address] Password [facility] 06/04/2016, 7:49 AM

## 2016-06-04 NOTE — Progress Notes (Signed)
Subjective: No new complaints   Antibiotics:  Anti-infectives    Start     Dose/Rate Route Frequency Ordered Stop   06/02/16 1800  ampicillin (OMNIPEN) 2 g in sodium chloride 0.9 % 50 mL IVPB     2 g 150 mL/hr over 20 Minutes Intravenous Every 6 hours 06/02/16 1452     06/02/16 1500  clindamycin (CLEOCIN) IVPB 600 mg  Status:  Discontinued     600 mg 100 mL/hr over 30 Minutes Intravenous Every 8 hours 06/02/16 1452 06/04/16 1218   05/30/16 2200  cefTRIAXone (ROCEPHIN) 2 g in dextrose 5 % 50 mL IVPB  Status:  Discontinued     2 g 100 mL/hr over 30 Minutes Intravenous Every 24 hours 05/30/16 1229 06/02/16 1451   05/30/16 1100  vancomycin (VANCOCIN) 1,250 mg in sodium chloride 0.9 % 250 mL IVPB  Status:  Discontinued     1,250 mg 166.7 mL/hr over 90 Minutes Intravenous Every 12 hours 05/29/16 2345 05/30/16 1229   05/30/16 0600  piperacillin-tazobactam (ZOSYN) IVPB 3.375 g  Status:  Discontinued     3.375 g 12.5 mL/hr over 240 Minutes Intravenous Every 8 hours 05/29/16 2343 05/30/16 1229   05/29/16 2330  piperacillin-tazobactam (ZOSYN) IVPB 3.375 g     3.375 g 100 mL/hr over 30 Minutes Intravenous  Once 05/29/16 2324 05/30/16 0015   05/29/16 2330  vancomycin (VANCOCIN) IVPB 1000 mg/200 mL premix  Status:  Discontinued     1,000 mg 200 mL/hr over 60 Minutes Intravenous  Once 05/29/16 2324 05/29/16 2336   05/29/16 2115  vancomycin (VANCOCIN) IVPB 1000 mg/200 mL premix     1,000 mg 200 mL/hr over 60 Minutes Intravenous  Once 05/29/16 2021 05/30/16 0700   05/29/16 2000  cefTRIAXone (ROCEPHIN) 1 g in dextrose 5 % 50 mL IVPB     1 g 100 mL/hr over 30 Minutes Intravenous  Once 05/29/16 1949 05/29/16 2347   05/29/16 2000  vancomycin (VANCOCIN) IVPB 1000 mg/200 mL premix     1,000 mg 200 mL/hr over 60 Minutes Intravenous  Once 05/29/16 1949 05/29/16 2122   05/29/16 0915  vancomycin (VANCOCIN) IVPB 1000 mg/200 mL premix  Status:  Discontinued     1,000 mg 200 mL/hr over 60  Minutes Intravenous  Once 05/29/16 2021 05/29/16 2021      Medications: Scheduled Meds: . ampicillin (OMNIPEN) IV  2 g Intravenous Q6H  . aspirin EC  81 mg Oral Once per day on Mon Thu  . cholecalciferol  2,000 Units Oral QODAY  . docusate sodium  100 mg Oral BID  . enoxaparin (LOVENOX) injection  65 mg Subcutaneous Q24H  . furosemide  40 mg Intravenous BID  . levothyroxine  50 mcg Oral QODAY  . levothyroxine  75 mcg Oral QODAY  . multivitamin with minerals  1 tablet Oral Once per day on Mon Thu  . simvastatin  20 mg Oral Once per day on Mon Wed Fri   Continuous Infusions: PRN Meds:.acetaminophen **OR** acetaminophen, bisacodyl, famotidine, ondansetron **OR** ondansetron (ZOFRAN) IV, oxyCODONE-acetaminophen, traZODone    Objective: Weight change:   Intake/Output Summary (Last 24 hours) at 06/04/16 1218 Last data filed at 06/04/16 1147  Gross per 24 hour  Intake             1410 ml  Output             4175 ml  Net            -  2765 ml   Blood pressure 118/60, pulse 73, temperature 98.8 F (37.1 C), temperature source Oral, resp. rate 18, height 6\' 5"  (1.956 m), weight 291 lb 0.1 oz (132 kg), SpO2 97 %. Temp:  [97.7 F (36.5 C)-99.4 F (37.4 C)] 98.8 F (37.1 C) (11/23 0929) Pulse Rate:  [69-83] 73 (11/23 0929) Resp:  [18] 18 (11/23 0929) BP: (109-134)/(58-68) 118/60 (11/23 0929) SpO2:  [97 %] 97 % (11/23 0929)  Physical Exam: General: Alert and awake, oriented x3, not in any acute distress. HEENT: anicteric sclera,  EOMI CVS regular rate, normal r,  no murmur rubs or gallops Chest:  no wheezing, resp diStress Abdomen: soft nontender, nondistended, normal bowel sounds, Extremities: Skin:   Cellulitis present he has tenderness particularly in the ankle but less than other than days prior     06/04/16:     Neuro: nonfocal  CBC:  CBC Latest Ref Rng & Units 06/04/2016 06/03/2016 06/01/2016  WBC 4.0 - 10.5 K/uL 9.0 10.8(H) 6.0  Hemoglobin 13.0 - 17.0 g/dL  12.2(L) 13.1 13.0  Hematocrit 39.0 - 52.0 % 37.0(L) 40.0 38.8(L)  Platelets 150 - 400 K/uL 168 162 127(L)      BMET  Recent Labs  06/03/16 0535 06/04/16 0549  NA 138 137  K 3.5 3.6  CL 105 102  CO2 24 27  GLUCOSE 122* 117*  BUN 6 9  CREATININE 0.92 0.93  CALCIUM 8.3* 8.2*     Liver Panel  No results for input(s): PROT, ALBUMIN, AST, ALT, ALKPHOS, BILITOT, BILIDIR, IBILI in the last 72 hours.     Sedimentation Rate No results for input(s): ESRSEDRATE in the last 72 hours. C-Reactive Protein No results for input(s): CRP in the last 72 hours.  Micro Results: Recent Results (from the past 720 hour(s))  Blood culture (routine x 2)     Status: Abnormal   Collection Time: 05/29/16  7:57 PM  Result Value Ref Range Status   Specimen Description BLOOD RIGHT FOREARM  Final   Special Requests BOTTLES DRAWN AEROBIC AND ANAEROBIC 5CC  Final   Culture  Setup Time   Final    GRAM POSITIVE COCCI IN CHAINS IN BOTH AEROBIC AND ANAEROBIC BOTTLES CRITICAL RESULT CALLED TO, READ BACK BY AND VERIFIED WITH: A. MASTERS, PHARM AT 1135 ON [id number] BY Rhea Bleacher    Culture (A)  Final    STREPTOCOCCUS GROUP G SUSCEPTIBILITIES PERFORMED ON PREVIOUS CULTURE WITHIN THE LAST 5 DAYS.    Report Status 06/01/2016 FINAL  Final  Blood culture (routine x 2)     Status: Abnormal   Collection Time: 05/29/16  8:15 PM  Result Value Ref Range Status   Specimen Description BLOOD RIGHT HAND  Final   Special Requests BOTTLES DRAWN AEROBIC AND ANAEROBIC 5CC  Final   Culture  Setup Time   Final    GRAM POSITIVE COCCI IN CHAINS AEROBIC BOTTLE ONLY CRITICAL RESULT CALLED TO, READ BACK BY AND VERIFIED WITH: A. MASTERS, PHARM AT 1135 ON [id number] BY Rhea Bleacher    Culture STREPTOCOCCUS GROUP G (A)  Final   Report Status 06/01/2016 FINAL  Final   Organism ID, Bacteria STREPTOCOCCUS GROUP G  Final      Susceptibility   Streptococcus group g - MIC*    CLINDAMYCIN <=0.25 SENSITIVE Sensitive     AMPICILLIN  <=0.25 SENSITIVE Sensitive     ERYTHROMYCIN <=0.12 SENSITIVE Sensitive     VANCOMYCIN 0.5 SENSITIVE Sensitive     CEFTRIAXONE <=0.12 SENSITIVE Sensitive  LEVOFLOXACIN 1 SENSITIVE Sensitive     * STREPTOCOCCUS GROUP G  Blood Culture ID Panel (Reflexed)     Status: Abnormal   Collection Time: 05/29/16  8:15 PM  Result Value Ref Range Status   Enterococcus species NOT DETECTED NOT DETECTED Final   Listeria monocytogenes NOT DETECTED NOT DETECTED Final   Staphylococcus species NOT DETECTED NOT DETECTED Final   Staphylococcus aureus NOT DETECTED NOT DETECTED Final   Streptococcus species DETECTED (A) NOT DETECTED Final    Comment: CRITICAL RESULT CALLED TO, READ BACK BY AND VERIFIED WITH: A MASTERS,PHARMD AT 1135 05/30/16 BY S YARBROUGH    Streptococcus agalactiae NOT DETECTED NOT DETECTED Final   Streptococcus pneumoniae NOT DETECTED NOT DETECTED Final   Streptococcus pyogenes NOT DETECTED NOT DETECTED Final   Acinetobacter baumannii NOT DETECTED NOT DETECTED Final   Enterobacteriaceae species NOT DETECTED NOT DETECTED Final   Enterobacter cloacae complex NOT DETECTED NOT DETECTED Final   Escherichia coli NOT DETECTED NOT DETECTED Final   Klebsiella oxytoca NOT DETECTED NOT DETECTED Final   Klebsiella pneumoniae NOT DETECTED NOT DETECTED Final   Proteus species NOT DETECTED NOT DETECTED Final   Serratia marcescens NOT DETECTED NOT DETECTED Final   Haemophilus influenzae NOT DETECTED NOT DETECTED Final   Neisseria meningitidis NOT DETECTED NOT DETECTED Final   Pseudomonas aeruginosa NOT DETECTED NOT DETECTED Final   Candida albicans NOT DETECTED NOT DETECTED Final   Candida glabrata NOT DETECTED NOT DETECTED Final   Candida krusei NOT DETECTED NOT DETECTED Final   Candida parapsilosis NOT DETECTED NOT DETECTED Final   Candida tropicalis NOT DETECTED NOT DETECTED Final  Urine culture     Status: Abnormal   Collection Time: 05/29/16  9:18 PM  Result Value Ref Range Status    Specimen Description URINE, CLEAN CATCH  Final   Special Requests NONE  Final   Culture MULTIPLE SPECIES PRESENT, SUGGEST RECOLLECTION (A)  Final   Report Status 05/31/2016 FINAL  Final  Culture, blood (routine x 2)     Status: None (Preliminary result)   Collection Time: 06/01/16  2:38 PM  Result Value Ref Range Status   Specimen Description BLOOD BLOOD LEFT ARM  Final   Special Requests BOTTLES DRAWN AEROBIC ONLY 5CC  Final   Culture NO GROWTH 2 DAYS  Final   Report Status PENDING  Incomplete  Culture, blood (routine x 2)     Status: None (Preliminary result)   Collection Time: 06/01/16  2:45 PM  Result Value Ref Range Status   Specimen Description BLOOD LEFT ANTECUBITAL  Final   Special Requests BOTTLES DRAWN AEROBIC ONLY 5CC  Final   Culture NO GROWTH 2 DAYS  Final   Report Status PENDING  Incomplete    Studies/Results: Dg Chest 1 View  Result Date: 06/02/2016 CLINICAL DATA:  Cough for 3 days, shortness of Breath EXAM: CHEST 1 VIEW COMPARISON:  05/29/2016 FINDINGS: Borderline cardiomegaly. No infiltrate or pulmonary edema. Mild basilar atelectasis. IMPRESSION: Borderline cardiomegaly. No infiltrate or pulmonary edema. Mild basilar atelectasis. Electronically Signed   By: Lahoma Crocker M.D.   On: 06/02/2016 18:34      Assessment/Plan:  INTERVAL HISTORY: Edema of bit better   Principal Problem:   Sepsis ([place]) Active Problems:   Cellulitis   Hypothyroidism   GERD (gastroesophageal reflux disease)   Sepsis due to Streptococcus agalactiae (HCC)   Cellulitis of left leg   Chronic diastolic CHF (congestive heart failure) ([place])   Dilated cardiomyopathy ([place])    [name]  is a 70 y.o. male withcellulitis and associated streptococcal bacteremia:  I will continue ampicillin but discontinue the clindamycin  History of thoracic echocardiogram did not show evidence of endocarditis. I don't think we need to push for a transesophageal echocardiogram given the acuity of his  infection.     LOS: 6 days   Alcide Evener 06/04/2016, 12:18 PM

## 2016-06-05 DIAGNOSIS — R7881 Bacteremia: Secondary | ICD-10-CM

## 2016-06-05 DIAGNOSIS — B955 Unspecified streptococcus as the cause of diseases classified elsewhere: Secondary | ICD-10-CM | POA: Diagnosis present

## 2016-06-05 LAB — BASIC METABOLIC PANEL
Anion gap: 10 (ref 5–15)
BUN: 13 mg/dL (ref 6–20)
CO2: 27 mmol/L (ref 22–32)
Calcium: 8.2 mg/dL — ABNORMAL LOW (ref 8.9–10.3)
Chloride: 101 mmol/L (ref 101–111)
Creatinine, Ser: 0.93 mg/dL (ref 0.61–1.24)
GFR calc Af Amer: >60 mL/min (ref >60)
GLUCOSE: 113 mg/dL — AB (ref 65–99)
POTASSIUM: 3.2 mmol/L — AB (ref 3.5–5.1)
Sodium: 138 mmol/L (ref 135–145)

## 2016-06-05 LAB — CBC WITH DIFFERENTIAL/PLATELET
BASOS PCT: 0 %
Basophils Absolute: 0 10*3/uL (ref 0.0–0.1)
EOS ABS: 0.2 10*3/uL (ref 0.0–0.7)
EOS PCT: 3 %
HEMATOCRIT: 36.9 % — AB (ref 39.0–52.0)
HEMOGLOBIN: 12.2 g/dL — AB (ref 13.0–17.0)
LYMPHS PCT: 30 %
Lymphs Abs: 2.3 10*3/uL (ref 0.7–4.0)
MCH: 30.7 pg (ref 26.0–34.0)
MCHC: 33.1 g/dL (ref 30.0–36.0)
MCV: 92.9 fL (ref 78.0–100.0)
MONOS PCT: 7 %
Monocytes Absolute: 0.5 10*3/uL (ref 0.1–1.0)
NEUTROS ABS: 4.5 10*3/uL (ref 1.7–7.7)
NEUTROS PCT: 60 %
Platelets: 218 10*3/uL (ref 150–400)
RBC: 3.97 MIL/uL — ABNORMAL LOW (ref 4.22–5.81)
RDW: 14.4 % (ref 11.5–15.5)
WBC: 7.5 10*3/uL (ref 4.0–10.5)

## 2016-06-05 LAB — MAGNESIUM: Magnesium: 2 mg/dL (ref 1.7–2.4)

## 2016-06-05 MED ORDER — SODIUM CHLORIDE 0.9 % IV SOLN
2.0000 g | INTRAVENOUS | Status: DC
  Administered 2016-06-05 – 2016-06-06 (×5): 2 g via INTRAVENOUS
  Filled 2016-06-05 (×9): qty 2000

## 2016-06-05 MED ORDER — POTASSIUM CHLORIDE CRYS ER 20 MEQ PO TBCR
40.0000 meq | EXTENDED_RELEASE_TABLET | ORAL | Status: AC
  Administered 2016-06-05 (×2): 40 meq via ORAL
  Filled 2016-06-05 (×2): qty 2

## 2016-06-05 NOTE — Progress Notes (Addendum)
PROGRESS NOTE [facility]   LLEWYN BUONOCORE   [id number] DOB: 30-Mar-1946  DOA: 05/29/2016 PCP: Wenda Low, MD   Brief Narrative:  70 year old male with past medical history of hyperlipidemia, mitral valve prolapse, OSA on CPAP, and hypothyroidism. Who was admitted for sepsis secondary to cellulitis. She was place on IV antibiotics and IV fluids were given. Patient subsequently was complaining of shortness of breath which was happening few days prior to admission. Patient was found to have diastolic CHF was started on IV Lasix. Patient currently receiving treatment for cellulitis with ampicillin and getting diuresis.  Subjective: Patient seen and examined at bedside. Lying comfortable with no complaints. Patient stated that his leg have slightly improved. No acute events overnight. Patient remains afebrile.  Assessment & Plan: Cellulitis, with bacteremia positive for strep group G. - Sepsis resolved, leg slightly improved from picture from yesterday No evidence of deep infection or osteomyelitis reported an MRI Repeated blood cultures negative up-to-date Sensitive to ampicillin. Recommendations appreciated will continue patient with ampicillin only. Get doppler US   Acute diastolic CHF - EF 0000000 no wall motion abnormalities, ? dilated cardiomyopathy. Continue IV Lasix with good diuresis. Strict I's and O's Daily weight No need for additional seizure medication given preserved ejection fraction. This can be treated as an outpatient. Will refer to cardiologist.  Cough - improved with treatment, likely to be related to pulm edema  If become hypoxic will obtain CTA   Hypothyroidism  Stable  Continue synthroid  DVT prophylaxis: Lovenox Code Status: Full Family Communication: Wife at bedside Disposition Plan: Resolution cellulitis  Consultants:  Dr. Carlyle Basques ID  Procedures/Significant Events:  11/21 Echocardiogram: LVEF=: 55% to 60%. - Right ventricle:  moderately dilated.  - Right atrium:  moderately to severely dilated.  Cultures 11/17 blood right forearm/hand positive strep group G 11/17 urine positive multiple species 11/20 blood 2 NGTD   Antimicrobials: Ampicillin 11/21>> Ceftriaxone 11/17>> 11/21 Clindamycin 11/21>>11/23 Zosyn 11/17>> 11/18 Vancomycin 11/17>> 11/18    Objective: Vitals:   06/04/16 2117 06/05/16 0439 06/05/16 0845 06/05/16 0915  BP: 112/60 (!) 93/48 (!) 114/54   Pulse: 69 (!) 53 73   Resp: 19 20 18    Temp: 97.6 F (36.4 C) 98.2 F (36.8 C) 98.3 F (36.8 C)   TempSrc: Oral Oral Oral   SpO2: 96% 95% 93%   Weight:    133.2 kg (293 lb 11.2 oz)  Height:        Intake/Output Summary (Last 24 hours) at 06/05/16 1629 Last data filed at 06/05/16 1500  Gross per 24 hour  Intake             1100 ml  Output             3650 ml  Net            -2550 ml   Filed Weights   05/30/16 2105 05/31/16 2220 06/05/16 0915  Weight: 131.5 kg (289 lb 14.5 oz) 132 kg (291 lb 0.1 oz) 133.2 kg (293 lb 11.2 oz)    Examination:  General exam: Appears calm and comfortable  Respiratory system: Clear to auscultation. No wheezes,crackle or rhonchi Cardiovascular system: S1 & S2 heard, RRR. No JVD, murmurs, rubs or gallops. Extremities: R pedal edema 1+ pitting. Left LE erythema from the knee below, with marked edema 2-3 + pitting, warm and tender Skin: No rashes, lesions or ulcers Psychiatry: Judgement and insight appear normal. Mood & affect appropriate.    Data Reviewed: I have personally reviewed following  labs and imaging studies  CBC:  Recent Labs Lab 05/29/16 1957 05/30/16 0354 06/01/16 1143 06/03/16 0535 06/04/16 0549 06/05/16 0419  WBC 11.5* 11.4* 6.0 10.8* 9.0 7.5  NEUTROABS 10.2*  --   --  7.5 6.2 4.5  HGB 15.2 14.0 13.0 13.1 12.2* 12.2*  HCT 44.6 42.9 38.8* 40.0 37.0* 36.9*  MCV 92.9 93.9 93.7 93.7 93.0 92.9  PLT 138* 131* 127* 162 168 [id number]   Basic Metabolic Panel:  Recent Labs Lab  05/29/16 1957 05/30/16 0354 06/03/16 0535 06/04/16 0549 06/05/16 0419  NA 136 136 138 137 138  K 3.9 4.1 3.5 3.6 3.2*  CL 102 103 105 102 101  CO2 23 24 24 27 27   GLUCOSE 134* 144* 122* 117* 113*  BUN 13 13 6 9 13   CREATININE 0.99 1.15 0.92 0.93 0.93  CALCIUM 9.7 8.4* 8.3* 8.2* 8.2*  MG  --   --   --  1.9 2.0   GFR: Estimated Creatinine Clearance: 108.4 mL/min (by C-G formula based on SCr of 0.93 mg/dL). Liver Function Tests: No results for input(s): AST, ALT, ALKPHOS, BILITOT, PROT, ALBUMIN in the last 168 hours. No results for input(s): LIPASE, AMYLASE in the last 168 hours. No results for input(s): AMMONIA in the last 168 hours. Coagulation Profile: No results for input(s): INR, PROTIME in the last 168 hours. Cardiac Enzymes: No results for input(s): CKTOTAL, CKMB, CKMBINDEX, TROPONINI in the last 168 hours. BNP (last 3 results) No results for input(s): PROBNP in the last 8760 hours. HbA1C: No results for input(s): HGBA1C in the last 72 hours. CBG: No results for input(s): GLUCAP in the last 168 hours. Lipid Profile: No results for input(s): CHOL, HDL, LDLCALC, TRIG, CHOLHDL, LDLDIRECT in the last 72 hours. Thyroid Function Tests: No results for input(s): TSH, T4TOTAL, FREET4, T3FREE, THYROIDAB in the last 72 hours. Anemia Panel: No results for input(s): VITAMINB12, FOLATE, FERRITIN, TIBC, IRON, RETICCTPCT in the last 72 hours. Sepsis Labs:  Recent Labs Lab 05/30/16 0045 05/30/16 0354 05/31/16 1031 06/04/16 0549  LATICACIDVEN 2.3* 2.2* 2.0* 1.1    Recent Results (from the past 240 hour(s))  Blood culture (routine x 2)     Status: Abnormal   Collection Time: 05/29/16  7:57 PM  Result Value Ref Range Status   Specimen Description BLOOD RIGHT FOREARM  Final   Special Requests BOTTLES DRAWN AEROBIC AND ANAEROBIC 5CC  Final   Culture  Setup Time   Final    GRAM POSITIVE COCCI IN CHAINS IN BOTH AEROBIC AND ANAEROBIC BOTTLES CRITICAL RESULT CALLED TO, READ BACK  BY AND VERIFIED WITH: A. MASTERS, PHARM AT 1135 ON [id number] BY Rhea Bleacher    Culture (A)  Final    STREPTOCOCCUS GROUP G SUSCEPTIBILITIES PERFORMED ON PREVIOUS CULTURE WITHIN THE LAST 5 DAYS.    Report Status 06/01/2016 FINAL  Final  Blood culture (routine x 2)     Status: Abnormal   Collection Time: 05/29/16  8:15 PM  Result Value Ref Range Status   Specimen Description BLOOD RIGHT HAND  Final   Special Requests BOTTLES DRAWN AEROBIC AND ANAEROBIC 5CC  Final   Culture  Setup Time   Final    GRAM POSITIVE COCCI IN CHAINS AEROBIC BOTTLE ONLY CRITICAL RESULT CALLED TO, READ BACK BY AND VERIFIED WITH: A. MASTERS, PHARM AT [id number] ON [id number] BY Rhea Bleacher    Culture STREPTOCOCCUS GROUP G (A)  Final   Report Status 06/01/2016 FINAL  Final   Organism ID, Bacteria STREPTOCOCCUS GROUP G  Final      Susceptibility   Streptococcus group g - MIC*    CLINDAMYCIN <=0.25 SENSITIVE Sensitive     AMPICILLIN <=0.25 SENSITIVE Sensitive     ERYTHROMYCIN <=0.12 SENSITIVE Sensitive     VANCOMYCIN 0.5 SENSITIVE Sensitive     CEFTRIAXONE <=0.12 SENSITIVE Sensitive     LEVOFLOXACIN 1 SENSITIVE Sensitive     * STREPTOCOCCUS GROUP G  Blood Culture ID Panel (Reflexed)     Status: Abnormal   Collection Time: 05/29/16  8:15 PM  Result Value Ref Range Status   Enterococcus species NOT DETECTED NOT DETECTED Final   Listeria monocytogenes NOT DETECTED NOT DETECTED Final   Staphylococcus species NOT DETECTED NOT DETECTED Final   Staphylococcus aureus NOT DETECTED NOT DETECTED Final   Streptococcus species DETECTED (A) NOT DETECTED Final    Comment: CRITICAL RESULT CALLED TO, READ BACK BY AND VERIFIED WITH: A MASTERS,PHARMD AT 1135 05/30/16 BY S YARBROUGH    Streptococcus agalactiae NOT DETECTED NOT DETECTED Final   Streptococcus pneumoniae NOT DETECTED NOT DETECTED Final   Streptococcus pyogenes NOT DETECTED NOT DETECTED Final   Acinetobacter baumannii NOT DETECTED NOT DETECTED Final   Enterobacteriaceae  species NOT DETECTED NOT DETECTED Final   Enterobacter cloacae complex NOT DETECTED NOT DETECTED Final   Escherichia coli NOT DETECTED NOT DETECTED Final   Klebsiella oxytoca NOT DETECTED NOT DETECTED Final   Klebsiella pneumoniae NOT DETECTED NOT DETECTED Final   Proteus species NOT DETECTED NOT DETECTED Final   Serratia marcescens NOT DETECTED NOT DETECTED Final   Haemophilus influenzae NOT DETECTED NOT DETECTED Final   Neisseria meningitidis NOT DETECTED NOT DETECTED Final   Pseudomonas aeruginosa NOT DETECTED NOT DETECTED Final   Candida albicans NOT DETECTED NOT DETECTED Final   Candida glabrata NOT DETECTED NOT DETECTED Final   Candida krusei NOT DETECTED NOT DETECTED Final   Candida parapsilosis NOT DETECTED NOT DETECTED Final   Candida tropicalis NOT DETECTED NOT DETECTED Final  Urine culture     Status: Abnormal   Collection Time: 05/29/16  9:18 PM  Result Value Ref Range Status   Specimen Description URINE, CLEAN CATCH  Final   Special Requests NONE  Final   Culture MULTIPLE SPECIES PRESENT, SUGGEST RECOLLECTION (A)  Final   Report Status 05/31/2016 FINAL  Final  Culture, blood (routine x 2)     Status: None (Preliminary result)   Collection Time: 06/01/16  2:38 PM  Result Value Ref Range Status   Specimen Description BLOOD BLOOD LEFT ARM  Final   Special Requests BOTTLES DRAWN AEROBIC ONLY 5CC  Final   Culture NO GROWTH 4 DAYS  Final   Report Status PENDING  Incomplete  Culture, blood (routine x 2)     Status: None (Preliminary result)   Collection Time: 06/01/16  2:45 PM  Result Value Ref Range Status   Specimen Description BLOOD LEFT ANTECUBITAL  Final   Special Requests BOTTLES DRAWN AEROBIC ONLY 5CC  Final   Culture NO GROWTH 4 DAYS  Final   Report Status PENDING  Incomplete      Radiology Studies: Dg Chest Port 1 View  Result Date: 06/04/2016 CLINICAL DATA:  Cough with increased wheezing EXAM: PORTABLE CHEST 1 VIEW COMPARISON:  06/02/2016 FINDINGS: Left CP  angle is not included. Mild to moderate cardiomegaly. Prominent central pulmonary vessels but without overt edema. Atherosclerosis of the aorta. Hazy right infrahilar atelectasis versus mild infiltrate. Bibasilar interstitial thickening could relate to central airways inflammation. IMPRESSION: 1. Mild to moderate cardiomegaly  with prominent central pulmonary vessels but no overt edema 2. Mild right hazy infrahilar atelectasis. Interstitial opacities within the bilateral lower lung zones could relate to interstitial inflammation. Electronically Signed   By: Donavan Foil M.D.   On: 06/04/2016 16:50      Scheduled Meds: . ampicillin (OMNIPEN) IV  2 g Intravenous Q6H  . aspirin EC  81 mg Oral Once per day on Mon Thu  . cholecalciferol  2,000 Units Oral QODAY  . docusate sodium  100 mg Oral BID  . enoxaparin (LOVENOX) injection  65 mg Subcutaneous Q24H  . furosemide  40 mg Intravenous BID  . levothyroxine  50 mcg Oral QODAY  . levothyroxine  75 mcg Oral QODAY  . multivitamin with minerals  1 tablet Oral Once per day on Mon Thu  . potassium chloride  40 mEq Oral Q4H  . simvastatin  20 mg Oral Once per day on Mon Wed Fri   Continuous Infusions:   LOS: 7 days    Chipper Oman, MD [facility] Pager [phone]  If 7PM-7AM, please contact night-coverage [email or web address] Password [facility] 06/05/2016, 4:29 PM

## 2016-06-05 NOTE — Progress Notes (Signed)
Subjective: No new complaints   Antibiotics:  Anti-infectives    Start     Dose/Rate Route Frequency Ordered Stop   06/02/16 1800  ampicillin (OMNIPEN) 2 g in sodium chloride 0.9 % 50 mL IVPB     2 g 150 mL/hr over 20 Minutes Intravenous Every 6 hours 06/02/16 1452     06/02/16 1500  clindamycin (CLEOCIN) IVPB 600 mg  Status:  Discontinued     600 mg 100 mL/hr over 30 Minutes Intravenous Every 8 hours 06/02/16 1452 06/04/16 1218   05/30/16 2200  cefTRIAXone (ROCEPHIN) 2 g in dextrose 5 % 50 mL IVPB  Status:  Discontinued     2 g 100 mL/hr over 30 Minutes Intravenous Every 24 hours 05/30/16 1229 06/02/16 1451   05/30/16 1100  vancomycin (VANCOCIN) 1,250 mg in sodium chloride 0.9 % 250 mL IVPB  Status:  Discontinued     1,250 mg 166.7 mL/hr over 90 Minutes Intravenous Every 12 hours 05/29/16 2345 05/30/16 1229   05/30/16 0600  piperacillin-tazobactam (ZOSYN) IVPB 3.375 g  Status:  Discontinued     3.375 g 12.5 mL/hr over 240 Minutes Intravenous Every 8 hours 05/29/16 2343 05/30/16 1229   05/29/16 2330  piperacillin-tazobactam (ZOSYN) IVPB 3.375 g     3.375 g 100 mL/hr over 30 Minutes Intravenous  Once 05/29/16 2324 05/30/16 0015   05/29/16 2330  vancomycin (VANCOCIN) IVPB 1000 mg/200 mL premix  Status:  Discontinued     1,000 mg 200 mL/hr over 60 Minutes Intravenous  Once 05/29/16 2324 05/29/16 2336   05/29/16 2115  vancomycin (VANCOCIN) IVPB 1000 mg/200 mL premix     1,000 mg 200 mL/hr over 60 Minutes Intravenous  Once 05/29/16 2021 05/30/16 0700   05/29/16 2000  cefTRIAXone (ROCEPHIN) 1 g in dextrose 5 % 50 mL IVPB     1 g 100 mL/hr over 30 Minutes Intravenous  Once 05/29/16 1949 05/29/16 2347   05/29/16 2000  vancomycin (VANCOCIN) IVPB 1000 mg/200 mL premix     1,000 mg 200 mL/hr over 60 Minutes Intravenous  Once 05/29/16 1949 05/29/16 2122   05/29/16 0915  vancomycin (VANCOCIN) IVPB 1000 mg/200 mL premix  Status:  Discontinued     1,000 mg 200 mL/hr over 60  Minutes Intravenous  Once 05/29/16 2021 05/29/16 2021      Medications: Scheduled Meds: . ampicillin (OMNIPEN) IV  2 g Intravenous Q6H  . aspirin EC  81 mg Oral Once per day on Mon Thu  . cholecalciferol  2,000 Units Oral QODAY  . docusate sodium  100 mg Oral BID  . enoxaparin (LOVENOX) injection  65 mg Subcutaneous Q24H  . furosemide  40 mg Intravenous BID  . levothyroxine  50 mcg Oral QODAY  . levothyroxine  75 mcg Oral QODAY  . multivitamin with minerals  1 tablet Oral Once per day on Mon Thu  . potassium chloride  40 mEq Oral Q4H  . simvastatin  20 mg Oral Once per day on Mon Wed Fri   Continuous Infusions: PRN Meds:.acetaminophen **OR** acetaminophen, bisacodyl, chlorpheniramine-HYDROcodone, famotidine, ondansetron **OR** ondansetron (ZOFRAN) IV, oxyCODONE-acetaminophen, traZODone    Objective: Weight change:   Intake/Output Summary (Last 24 hours) at 06/05/16 1710 Last data filed at 06/05/16 1500  Gross per 24 hour  Intake             1100 ml  Output             3650 ml  Net            -  2550 ml   Blood pressure (!) 114/54, pulse 73, temperature 98.3 F (36.8 C), temperature source Oral, resp. rate 18, height 6\' 5"  (1.956 m), weight 293 lb 11.2 oz (133.2 kg), SpO2 93 %. Temp:  [97.6 F (36.4 C)-98.3 F (36.8 C)] 98.3 F (36.8 C) (11/24 0845) Pulse Rate:  [53-73] 73 (11/24 0845) Resp:  [18-20] 18 (11/24 0845) BP: (93-114)/(48-60) 114/54 (11/24 0845) SpO2:  [93 %-96 %] 93 % (11/24 0845) Weight:  [293 lb 11.2 oz (133.2 kg)] 293 lb 11.2 oz (133.2 kg) (11/24 0915)  Physical Exam: General: Alert and awake, oriented x3, not in any acute distress. HEENT: anicteric sclera,  EOMI CVS regular rate, normal r,  no murmur rubs or gallops Chest:  no wheezing, resp diStress Abdomen: soft nontender, nondistended, normal bowel sounds, Extremities: Skin:   Cellulitis present he has tenderness particularly in the ankle but less than other than days  prior     06/04/16:     06/05/16:      Neuro: nonfocal  CBC:  CBC Latest Ref Rng & Units 06/05/2016 06/04/2016 06/03/2016  WBC 4.0 - 10.5 K/uL 7.5 9.0 10.8(H)  Hemoglobin 13.0 - 17.0 g/dL 12.2(L) 12.2(L) 13.1  Hematocrit 39.0 - 52.0 % 36.9(L) 37.0(L) 40.0  Platelets 150 - 400 K/uL 218 168 162      BMET  Recent Labs  06/04/16 0549 06/05/16 0419  NA 137 138  K 3.6 3.2*  CL 102 101  CO2 27 27  GLUCOSE 117* 113*  BUN 9 13  CREATININE 0.93 0.93  CALCIUM 8.2* 8.2*     Liver Panel  No results for input(s): PROT, ALBUMIN, AST, ALT, ALKPHOS, BILITOT, BILIDIR, IBILI in the last 72 hours.     Sedimentation Rate No results for input(s): ESRSEDRATE in the last 72 hours. C-Reactive Protein No results for input(s): CRP in the last 72 hours.  Micro Results: Recent Results (from the past 720 hour(s))  Blood culture (routine x 2)     Status: Abnormal   Collection Time: 05/29/16  7:57 PM  Result Value Ref Range Status   Specimen Description BLOOD RIGHT FOREARM  Final   Special Requests BOTTLES DRAWN AEROBIC AND ANAEROBIC 5CC  Final   Culture  Setup Time   Final    GRAM POSITIVE COCCI IN CHAINS IN BOTH AEROBIC AND ANAEROBIC BOTTLES CRITICAL RESULT CALLED TO, READ BACK BY AND VERIFIED WITH: A. MASTERS, PHARM AT 1135 ON [id number] BY Rhea Bleacher    Culture (A)  Final    STREPTOCOCCUS GROUP G SUSCEPTIBILITIES PERFORMED ON PREVIOUS CULTURE WITHIN THE LAST 5 DAYS.    Report Status 06/01/2016 FINAL  Final  Blood culture (routine x 2)     Status: Abnormal   Collection Time: 05/29/16  8:15 PM  Result Value Ref Range Status   Specimen Description BLOOD RIGHT HAND  Final   Special Requests BOTTLES DRAWN AEROBIC AND ANAEROBIC 5CC  Final   Culture  Setup Time   Final    GRAM POSITIVE COCCI IN CHAINS AEROBIC BOTTLE ONLY CRITICAL RESULT CALLED TO, READ BACK BY AND VERIFIED WITH: A. MASTERS, PHARM AT 1135 ON [id number] BY Rhea Bleacher    Culture STREPTOCOCCUS GROUP G (A)   Final   Report Status 06/01/2016 FINAL  Final   Organism ID, Bacteria STREPTOCOCCUS GROUP G  Final      Susceptibility   Streptococcus group g - MIC*    CLINDAMYCIN <=0.25 SENSITIVE Sensitive     AMPICILLIN <=0.25 SENSITIVE Sensitive  ERYTHROMYCIN <=0.12 SENSITIVE Sensitive     VANCOMYCIN 0.5 SENSITIVE Sensitive     CEFTRIAXONE <=0.12 SENSITIVE Sensitive     LEVOFLOXACIN 1 SENSITIVE Sensitive     * STREPTOCOCCUS GROUP G  Blood Culture ID Panel (Reflexed)     Status: Abnormal   Collection Time: 05/29/16  8:15 PM  Result Value Ref Range Status   Enterococcus species NOT DETECTED NOT DETECTED Final   Listeria monocytogenes NOT DETECTED NOT DETECTED Final   Staphylococcus species NOT DETECTED NOT DETECTED Final   Staphylococcus aureus NOT DETECTED NOT DETECTED Final   Streptococcus species DETECTED (A) NOT DETECTED Final    Comment: CRITICAL RESULT CALLED TO, READ BACK BY AND VERIFIED WITH: A MASTERS,PHARMD AT 1135 05/30/16 BY S YARBROUGH    Streptococcus agalactiae NOT DETECTED NOT DETECTED Final   Streptococcus pneumoniae NOT DETECTED NOT DETECTED Final   Streptococcus pyogenes NOT DETECTED NOT DETECTED Final   Acinetobacter baumannii NOT DETECTED NOT DETECTED Final   Enterobacteriaceae species NOT DETECTED NOT DETECTED Final   Enterobacter cloacae complex NOT DETECTED NOT DETECTED Final   Escherichia coli NOT DETECTED NOT DETECTED Final   Klebsiella oxytoca NOT DETECTED NOT DETECTED Final   Klebsiella pneumoniae NOT DETECTED NOT DETECTED Final   Proteus species NOT DETECTED NOT DETECTED Final   Serratia marcescens NOT DETECTED NOT DETECTED Final   Haemophilus influenzae NOT DETECTED NOT DETECTED Final   Neisseria meningitidis NOT DETECTED NOT DETECTED Final   Pseudomonas aeruginosa NOT DETECTED NOT DETECTED Final   Candida albicans NOT DETECTED NOT DETECTED Final   Candida glabrata NOT DETECTED NOT DETECTED Final   Candida krusei NOT DETECTED NOT DETECTED Final   Candida  parapsilosis NOT DETECTED NOT DETECTED Final   Candida tropicalis NOT DETECTED NOT DETECTED Final  Urine culture     Status: Abnormal   Collection Time: 05/29/16  9:18 PM  Result Value Ref Range Status   Specimen Description URINE, CLEAN CATCH  Final   Special Requests NONE  Final   Culture MULTIPLE SPECIES PRESENT, SUGGEST RECOLLECTION (A)  Final   Report Status 05/31/2016 FINAL  Final  Culture, blood (routine x 2)     Status: None (Preliminary result)   Collection Time: 06/01/16  2:38 PM  Result Value Ref Range Status   Specimen Description BLOOD BLOOD LEFT ARM  Final   Special Requests BOTTLES DRAWN AEROBIC ONLY 5CC  Final   Culture NO GROWTH 4 DAYS  Final   Report Status PENDING  Incomplete  Culture, blood (routine x 2)     Status: None (Preliminary result)   Collection Time: 06/01/16  2:45 PM  Result Value Ref Range Status   Specimen Description BLOOD LEFT ANTECUBITAL  Final   Special Requests BOTTLES DRAWN AEROBIC ONLY 5CC  Final   Culture NO GROWTH 4 DAYS  Final   Report Status PENDING  Incomplete    Studies/Results: Dg Chest Port 1 View  Result Date: 06/04/2016 CLINICAL DATA:  Cough with increased wheezing EXAM: PORTABLE CHEST 1 VIEW COMPARISON:  06/02/2016 FINDINGS: Left CP angle is not included. Mild to moderate cardiomegaly. Prominent central pulmonary vessels but without overt edema. Atherosclerosis of the aorta. Hazy right infrahilar atelectasis versus mild infiltrate. Bibasilar interstitial thickening could relate to central airways inflammation. IMPRESSION: 1. Mild to moderate cardiomegaly with prominent central pulmonary vessels but no overt edema 2. Mild right hazy infrahilar atelectasis. Interstitial opacities within the bilateral lower lung zones could relate to interstitial inflammation. Electronically Signed   By: Madie Reno.D.  On: 06/04/2016 16:50      Assessment/Plan:  INTERVAL HISTORY: Edema of bit better  Redness is improved  Principal  Problem:   Sepsis ([place]) Active Problems:   Cellulitis   Hypothyroidism   GERD (gastroesophageal reflux disease)   Sepsis due to Streptococcus agalactiae (HCC)   Cellulitis of left leg   Chronic diastolic CHF (congestive heart failure) (HCC)   Dilated cardiomyopathy (HCC)   Cellulitis of left heel   Cough    Matthew Robinson is a 70 y.o. male withcellulitis and associated streptococcal bacteremia:  I will continue ampicillin   History of thoracic echocardiogram did not show evidence of endocarditis. I don't think we need to push for a transesophageal echocardiogram given the acuity of his infection.  Like to give him 2 weeks of parenteral therapy with day one being 06/01/2016 which the date of his first negative cultures. This can either be  High-dose continued ampicillin likely via a continuous 24 hour infusion.  Note I am increasing the frequency to every 4 hours based on his renal function that I see in Epic  The other option is once daily ceftriaxone 2 g push.  The advantage of the ampicillin is his neck more narrow nonspecific and less risk for C. difficile colitis the advantage of the latter is convenience of dosing.  I will continue to follow peripherally and check in on him again on Sunday       LOS: 7 days   Alcide Evener 06/05/2016, 5:10 PM

## 2016-06-06 ENCOUNTER — Encounter (HOSPITAL_COMMUNITY): Payer: No Typology Code available for payment source

## 2016-06-06 DIAGNOSIS — Z95828 Presence of other vascular implants and grafts: Secondary | ICD-10-CM

## 2016-06-06 LAB — CULTURE, BLOOD (ROUTINE X 2)
CULTURE: NO GROWTH
Culture: NO GROWTH

## 2016-06-06 LAB — CBC
HEMATOCRIT: 40.6 % (ref 39.0–52.0)
Hemoglobin: 13.4 g/dL (ref 13.0–17.0)
MCH: 30.8 pg (ref 26.0–34.0)
MCHC: 33 g/dL (ref 30.0–36.0)
MCV: 93.3 fL (ref 78.0–100.0)
PLATELETS: 268 10*3/uL (ref 150–400)
RBC: 4.35 MIL/uL (ref 4.22–5.81)
RDW: 14.2 % (ref 11.5–15.5)
WBC: 7 10*3/uL (ref 4.0–10.5)

## 2016-06-06 LAB — BASIC METABOLIC PANEL
Anion gap: 9 (ref 5–15)
BUN: 12 mg/dL (ref 6–20)
CHLORIDE: 103 mmol/L (ref 101–111)
CO2: 28 mmol/L (ref 22–32)
CREATININE: 0.9 mg/dL (ref 0.61–1.24)
Calcium: 8.5 mg/dL — ABNORMAL LOW (ref 8.9–10.3)
GFR calc non Af Amer: >60 mL/min (ref >60)
Glucose, Bld: 117 mg/dL — ABNORMAL HIGH (ref 65–99)
POTASSIUM: 3.7 mmol/L (ref 3.5–5.1)
SODIUM: 140 mmol/L (ref 135–145)

## 2016-06-06 LAB — MAGNESIUM: Magnesium: 2 mg/dL (ref 1.7–2.4)

## 2016-06-06 MED ORDER — SODIUM CHLORIDE 0.9 % IV SOLN
2.0000 g | INTRAVENOUS | Status: DC
  Filled 2016-06-06 (×3): qty 2000

## 2016-06-06 MED ORDER — SODIUM CHLORIDE 0.9 % IV SOLN
12.0000 g | INTRAVENOUS | Status: DC
  Filled 2016-06-06 (×39): qty 48

## 2016-06-06 MED ORDER — PENICILLIN G POTASSIUM 20000000 UNITS IJ SOLR
12.0000 10*6.[IU] | Freq: Two times a day (BID) | INTRAVENOUS | Status: DC
  Administered 2016-06-06 – 2016-06-08 (×4): 12 10*6.[IU] via INTRAVENOUS
  Filled 2016-06-06 (×5): qty 12

## 2016-06-06 NOTE — Progress Notes (Signed)
Subjective:  Pt w more edema left leg after ambulation  C/o timing off on abx   Antibiotics:  Anti-infectives    Start     Dose/Rate Route Frequency Ordered Stop   06/06/16 1330  ampicillin (OMNIPEN) 12 g in sodium chloride 0.9 % 50 mL IVPB  Status:  Discontinued     12 g 150 mL/hr over 20 Minutes Intravenous Continuous 06/06/16 1131 06/06/16 1136   06/06/16 1330  ampicillin (OMNIPEN) 2 g in sodium chloride 0.9 % 50 mL IVPB  Status:  Discontinued     2 g 150 mL/hr over 20 Minutes Intravenous Every 4 hours 06/06/16 1136 06/06/16 1155   06/06/16 1300  penicillin G potassium 12 Million Units in dextrose 5 % 500 mL continuous infusion     12 Million Units 41.7 mL/hr over 12 Hours Intravenous Every 12 hours 06/06/16 1155     06/05/16 1730  ampicillin (OMNIPEN) 2 g in sodium chloride 0.9 % 50 mL IVPB  Status:  Discontinued     2 g 150 mL/hr over 20 Minutes Intravenous Every 4 hours 06/05/16 1713 06/06/16 1131   06/02/16 1800  ampicillin (OMNIPEN) 2 g in sodium chloride 0.9 % 50 mL IVPB  Status:  Discontinued     2 g 150 mL/hr over 20 Minutes Intravenous Every 6 hours 06/02/16 1452 06/05/16 1713   06/02/16 1500  clindamycin (CLEOCIN) IVPB 600 mg  Status:  Discontinued     600 mg 100 mL/hr over 30 Minutes Intravenous Every 8 hours 06/02/16 1452 06/04/16 1218   05/30/16 2200  cefTRIAXone (ROCEPHIN) 2 g in dextrose 5 % 50 mL IVPB  Status:  Discontinued     2 g 100 mL/hr over 30 Minutes Intravenous Every 24 hours 05/30/16 1229 06/02/16 1451   05/30/16 1100  vancomycin (VANCOCIN) 1,250 mg in sodium chloride 0.9 % 250 mL IVPB  Status:  Discontinued     1,250 mg 166.7 mL/hr over 90 Minutes Intravenous Every 12 hours 05/29/16 2345 05/30/16 1229   05/30/16 0600  piperacillin-tazobactam (ZOSYN) IVPB 3.375 g  Status:  Discontinued     3.375 g 12.5 mL/hr over 240 Minutes Intravenous Every 8 hours 05/29/16 2343 05/30/16 1229   05/29/16 2330  piperacillin-tazobactam (ZOSYN) IVPB 3.375 g      3.375 g 100 mL/hr over 30 Minutes Intravenous  Once 05/29/16 2324 05/30/16 0015   05/29/16 2330  vancomycin (VANCOCIN) IVPB 1000 mg/200 mL premix  Status:  Discontinued     1,000 mg 200 mL/hr over 60 Minutes Intravenous  Once 05/29/16 2324 05/29/16 2336   05/29/16 2115  vancomycin (VANCOCIN) IVPB 1000 mg/200 mL premix     1,000 mg 200 mL/hr over 60 Minutes Intravenous  Once 05/29/16 2021 05/30/16 0700   05/29/16 2000  cefTRIAXone (ROCEPHIN) 1 g in dextrose 5 % 50 mL IVPB     1 g 100 mL/hr over 30 Minutes Intravenous  Once 05/29/16 1949 05/29/16 2347   05/29/16 2000  vancomycin (VANCOCIN) IVPB 1000 mg/200 mL premix     1,000 mg 200 mL/hr over 60 Minutes Intravenous  Once 05/29/16 1949 05/29/16 2122   05/29/16 0915  vancomycin (VANCOCIN) IVPB 1000 mg/200 mL premix  Status:  Discontinued     1,000 mg 200 mL/hr over 60 Minutes Intravenous  Once 05/29/16 2021 05/29/16 2021      Medications: Scheduled Meds: . aspirin EC  81 mg Oral Once per day on Mon Thu  . cholecalciferol  2,000 Units  Oral QODAY  . docusate sodium  100 mg Oral BID  . enoxaparin (LOVENOX) injection  65 mg Subcutaneous Q24H  . furosemide  40 mg Intravenous BID  . levothyroxine  50 mcg Oral QODAY  . levothyroxine  75 mcg Oral QODAY  . multivitamin with minerals  1 tablet Oral Once per day on Mon Thu  . penicillin g continuous IV infusion  12 Million Units Intravenous Q12H  . simvastatin  20 mg Oral Once per day on Mon Wed Fri   Continuous Infusions: PRN Meds:.acetaminophen **OR** acetaminophen, bisacodyl, chlorpheniramine-HYDROcodone, famotidine, ondansetron **OR** ondansetron (ZOFRAN) IV, oxyCODONE-acetaminophen, traZODone    Objective: Weight change:   Intake/Output Summary (Last 24 hours) at 06/06/16 1819 Last data filed at 06/06/16 1800  Gross per 24 hour  Intake             1970 ml  Output             3565 ml  Net            -1595 ml   Blood pressure (!) 141/74, pulse 78, temperature 98.8 F (37.1  C), temperature source Oral, resp. rate 18, height '6\' 5"'$  (1.956 m), weight 293 lb 11.2 oz (133.2 kg), SpO2 97 %. Temp:  [98 F (36.7 C)-98.9 F (37.2 C)] 98.8 F (37.1 C) (11/25 1751) Pulse Rate:  [60-78] 78 (11/25 1751) Resp:  [16-18] 18 (11/25 1751) BP: (111-141)/(61-74) 141/74 (11/25 1751) SpO2:  [97 %-98 %] 97 % (11/25 1751)  Physical Exam: General: Alert and awake, oriented x3, not in any acute distress. HEENT: anicteric sclera,  EOMI CVS regular rate, normal r,  no murmur rubs or gallops Chest:  no wheezing, resp diStress Abdomen: soft nontender, nondistended, normal bowel sounds, Extremities: Skin:   Cellulitis present he has tenderness particularly in the ankle but less than other than days prior     06/04/16:     06/05/16:    06/06/16:      Neuro: nonfocal  CBC:  CBC Latest Ref Rng & Units 06/06/2016 06/05/2016 06/04/2016  WBC 4.0 - 10.5 K/uL 7.0 7.5 9.0  Hemoglobin 13.0 - 17.0 g/dL 13.4 12.2(L) 12.2(L)  Hematocrit 39.0 - 52.0 % 40.6 36.9(L) 37.0(L)  Platelets 150 - 400 K/uL 268 218 168      BMET  Recent Labs  06/05/16 0419 06/06/16 0646  NA 138 140  K 3.2* 3.7  CL 101 103  CO2 27 28  GLUCOSE 113* 117*  BUN 13 12  CREATININE 0.93 0.90  CALCIUM 8.2* 8.5*     Liver Panel  No results for input(s): PROT, ALBUMIN, AST, ALT, ALKPHOS, BILITOT, BILIDIR, IBILI in the last 72 hours.     Sedimentation Rate No results for input(s): ESRSEDRATE in the last 72 hours. C-Reactive Protein No results for input(s): CRP in the last 72 hours.  Micro Results: Recent Results (from the past 720 hour(s))  Blood culture (routine x 2)     Status: Abnormal   Collection Time: 05/29/16  7:57 PM  Result Value Ref Range Status   Specimen Description BLOOD RIGHT FOREARM  Final   Special Requests BOTTLES DRAWN AEROBIC AND ANAEROBIC 5CC  Final   Culture  Setup Time   Final    GRAM POSITIVE COCCI IN CHAINS IN BOTH AEROBIC AND ANAEROBIC BOTTLES CRITICAL  RESULT CALLED TO, READ BACK BY AND VERIFIED WITH: A. MASTERS, PHARM AT 1135 ON [id number] BY Rhea Bleacher    Culture (A)  Final    STREPTOCOCCUS GROUP G  SUSCEPTIBILITIES PERFORMED ON PREVIOUS CULTURE WITHIN THE LAST 5 DAYS.    Report Status 06/01/2016 FINAL  Final  Blood culture (routine x 2)     Status: Abnormal   Collection Time: 05/29/16  8:15 PM  Result Value Ref Range Status   Specimen Description BLOOD RIGHT HAND  Final   Special Requests BOTTLES DRAWN AEROBIC AND ANAEROBIC 5CC  Final   Culture  Setup Time   Final    GRAM POSITIVE COCCI IN CHAINS AEROBIC BOTTLE ONLY CRITICAL RESULT CALLED TO, READ BACK BY AND VERIFIED WITH: A. MASTERS, PHARM AT 1135 ON [id number] BY Rhea Bleacher    Culture STREPTOCOCCUS GROUP G (A)  Final   Report Status 06/06/2016 FINAL  Final   Organism ID, Bacteria STREPTOCOCCUS GROUP G  Final      Susceptibility   Streptococcus group g - MIC*    CLINDAMYCIN <=0.25 SENSITIVE Sensitive     AMPICILLIN <=0.25 SENSITIVE Sensitive     ERYTHROMYCIN <=0.12 SENSITIVE Sensitive     VANCOMYCIN 0.5 SENSITIVE Sensitive     CEFTRIAXONE <=0.12 SENSITIVE Sensitive     LEVOFLOXACIN 1 SENSITIVE Sensitive     PENICILLIN Value in next row Sensitive      SENSITIVE<0.06    * STREPTOCOCCUS GROUP G  Blood Culture ID Panel (Reflexed)     Status: Abnormal   Collection Time: 05/29/16  8:15 PM  Result Value Ref Range Status   Enterococcus species NOT DETECTED NOT DETECTED Final   Listeria monocytogenes NOT DETECTED NOT DETECTED Final   Staphylococcus species NOT DETECTED NOT DETECTED Final   Staphylococcus aureus NOT DETECTED NOT DETECTED Final   Streptococcus species DETECTED (A) NOT DETECTED Final    Comment: CRITICAL RESULT CALLED TO, READ BACK BY AND VERIFIED WITH: A MASTERS,PHARMD AT 1135 05/30/16 BY S YARBROUGH    Streptococcus agalactiae NOT DETECTED NOT DETECTED Final   Streptococcus pneumoniae NOT DETECTED NOT DETECTED Final   Streptococcus pyogenes NOT DETECTED NOT  DETECTED Final   Acinetobacter baumannii NOT DETECTED NOT DETECTED Final   Enterobacteriaceae species NOT DETECTED NOT DETECTED Final   Enterobacter cloacae complex NOT DETECTED NOT DETECTED Final   Escherichia coli NOT DETECTED NOT DETECTED Final   Klebsiella oxytoca NOT DETECTED NOT DETECTED Final   Klebsiella pneumoniae NOT DETECTED NOT DETECTED Final   Proteus species NOT DETECTED NOT DETECTED Final   Serratia marcescens NOT DETECTED NOT DETECTED Final   Haemophilus influenzae NOT DETECTED NOT DETECTED Final   Neisseria meningitidis NOT DETECTED NOT DETECTED Final   Pseudomonas aeruginosa NOT DETECTED NOT DETECTED Final   Candida albicans NOT DETECTED NOT DETECTED Final   Candida glabrata NOT DETECTED NOT DETECTED Final   Candida krusei NOT DETECTED NOT DETECTED Final   Candida parapsilosis NOT DETECTED NOT DETECTED Final   Candida tropicalis NOT DETECTED NOT DETECTED Final  Urine culture     Status: Abnormal   Collection Time: 05/29/16  9:18 PM  Result Value Ref Range Status   Specimen Description URINE, CLEAN CATCH  Final   Special Requests NONE  Final   Culture MULTIPLE SPECIES PRESENT, SUGGEST RECOLLECTION (A)  Final   Report Status 05/31/2016 FINAL  Final  Culture, blood (routine x 2)     Status: None   Collection Time: 06/01/16  2:38 PM  Result Value Ref Range Status   Specimen Description BLOOD BLOOD LEFT ARM  Final   Special Requests BOTTLES DRAWN AEROBIC ONLY 5CC  Final   Culture NO GROWTH 5 DAYS  Final  Report Status 06/06/2016 FINAL  Final  Culture, blood (routine x 2)     Status: None   Collection Time: 06/01/16  2:45 PM  Result Value Ref Range Status   Specimen Description BLOOD LEFT ANTECUBITAL  Final   Special Requests BOTTLES DRAWN AEROBIC ONLY 5CC  Final   Culture NO GROWTH 5 DAYS  Final   Report Status 06/06/2016 FINAL  Final    Studies/Results: No results found.    Assessment/Plan:  INTERVAL HISTORY: Edema of bit better  Today a bit worse per  pt but he is ambulating more  Redness is improved  Principal Problem:   Sepsis ([place]) Active Problems:   Cellulitis   Hypothyroidism   GERD (gastroesophageal reflux disease)   Sepsis due to Streptococcus agalactiae (HCC)   Cellulitis of left leg   Chronic diastolic CHF (congestive heart failure) (HCC)   Dilated cardiomyopathy (HCC)   Cellulitis of left heel   Cough   Streptococcal bacteremia    Matthew Robinson is a 70 y.o. male withcellulitis and associated streptococcal bacteremia:  I will switch to 24 hr infusion of PCN  Like to give him 2 weeks of parenteral therapy with day one being 06/01/2016 which the date of his first negative cultures.    Diagnosis:  STREP BACTEREMIA + CELLULITIS  Culture Result: GROUP G STREPTOCCUS  No Known Allergies  Discharge antibiotics: IV PCN  Duration: 2 WEEKS  End Date: DEC 3RD, 2017  [facility] Care Per Protocol:  Labs weekly while on IV antibiotics: _X_ CBC with differential _X_ BMP _X_ CRP _X_ ESR   _X_ Please pull PIC at completion of IV antibiotics __ Please leave PIC in place until doctor has seen patient or been notified  Fax weekly labs to [phone]  Clinic Follow Up Appt:  NEXT 2 WEEKS       LOS: 8 days   Alcide Evener 06/06/2016, 6:19 PM

## 2016-06-06 NOTE — Progress Notes (Signed)
PROGRESS NOTE [facility]   Matthew Robinson   [id number] DOB: 10-13-1945  DOA: 05/29/2016 PCP: Wenda Low, MD   Brief Narrative:  70 year old male with past medical history of hyperlipidemia, mitral valve prolapse, OSA on CPAP, and hypothyroidism. Who was admitted for sepsis secondary to cellulitis. She was place on IV antibiotics and IV fluids were given. Patient subsequently was complaining of shortness of breath which was happening few days prior to admission. Patient was found to have diastolic CHF was started on IV Lasix. Patient currently receiving treatment for cellulitis with ampicillin and getting diuresis.  Subjective: No changes, no new complaints. Redness and swelling improving. Doppler pending   Assessment & Plan: Cellulitis, with bacteremia positive for strep group G. - Sepsis resolved, leg slightly improved from picture from yesterday No evidence of deep infection or osteomyelitis reported an MRI Repeated blood cultures negative up-to-date ID recommendations greatly appreciated - patient will need 2 weeks of IV abx  PICC line ordered  Doppler US pending    Acute diastolic CHF - EF 0000000 no wall motion abnormalities, ? dilated cardiomyopathy. Continue IV Lasix with good diuresis. - kidney function ok  Strict I's and O's Daily weight No need for additional CHF medication given preserved ejection fraction. This can be treated as an outpatient. Will refer to cardiologist.  Cough - improved with treatment, likely to be related to pulm edema - resolved  If become hypoxic will obtain CTA   Hypothyroidism  Stable  Continue synthroid  DVT prophylaxis: Lovenox Code Status: Full Family Communication: Wife at bedside Disposition Plan: Resolution cellulitis  Consultants:  Dr. Carlyle Basques ID  Procedures/Significant Events:  11/21 Echocardiogram: LVEF=: 55% to 60%. - Right ventricle: moderately dilated.  - Right atrium:  moderately to severely  dilated.  Cultures 11/17 blood right forearm/hand positive strep group G 11/17 urine positive multiple species 11/20 blood 2 NGTD   Antimicrobials: Ampicillin 11/21>> Ceftriaxone 11/17>> 11/21 Clindamycin 11/21>>11/23 Zosyn 11/17>> 11/18 Vancomycin 11/17>> 11/18    Objective: Vitals:   06/05/16 0915 06/05/16 1900 06/06/16 0629 06/06/16 0916  BP:  130/66 111/65 122/61  Pulse:  72 60 68  Resp:  18 16 16   Temp:  98.9 F (37.2 C) 98 F (36.7 C) 98.4 F (36.9 C)  TempSrc:  Oral Oral Oral  SpO2:  98% 97% 98%  Weight: 133.2 kg (293 lb 11.2 oz)     Height:        Intake/Output Summary (Last 24 hours) at 06/06/16 1504 Last data filed at 06/06/16 1400  Gross per 24 hour  Intake             1110 ml  Output             3190 ml  Net            -2080 ml   Filed Weights   05/30/16 2105 05/31/16 2220 06/05/16 0915  Weight: 131.5 kg (289 lb 14.5 oz) 132 kg (291 lb 0.1 oz) 133.2 kg (293 lb 11.2 oz)    Examination:  General exam: Appears calm and comfortable  Respiratory system: Clear to auscultation. No wheezes,crackle or rhonchi Cardiovascular system: S1 & S2 heard, RRR. No JVD, murmurs, rubs or gallops. Extremities: R pedal edema 1+ pitting. Left LE erythema from the knee below, with marked edema 2-3 + pitting, warm and tender -> Erythema and swelling decreasing. Less tender. See ID note picture  Skin: No rashes, lesions or ulcers   Data Reviewed: I have personally reviewed following labs  and imaging studies  CBC:  Recent Labs Lab 06/01/16 1143 06/03/16 0535 06/04/16 0549 06/05/16 0419 06/06/16 0646  WBC 6.0 10.8* 9.0 7.5 7.0  NEUTROABS  --  7.5 6.2 4.5  --   HGB 13.0 13.1 12.2* 12.2* 13.4  HCT 38.8* 40.0 37.0* 36.9* 40.6  MCV 93.7 93.7 93.0 92.9 93.3  PLT 127* 162 168 218 [id number]   Basic Metabolic Panel:  Recent Labs Lab 06/03/16 0535 06/04/16 0549 06/05/16 0419 06/06/16 0646  NA 138 137 138 140  K 3.5 3.6 3.2* 3.7  CL 105 102 101 103  CO2 24 27 27  28   GLUCOSE 122* 117* 113* 117*  BUN 6 9 13 12   CREATININE 0.92 0.93 0.93 0.90  CALCIUM 8.3* 8.2* 8.2* 8.5*  MG  --  1.9 2.0 2.0   GFR: Estimated Creatinine Clearance: 112 mL/min (by C-G formula based on SCr of 0.9 mg/dL). Liver Function Tests: No results for input(s): AST, ALT, ALKPHOS, BILITOT, PROT, ALBUMIN in the last 168 hours. No results for input(s): LIPASE, AMYLASE in the last 168 hours. No results for input(s): AMMONIA in the last 168 hours. Coagulation Profile: No results for input(s): INR, PROTIME in the last 168 hours. Cardiac Enzymes: No results for input(s): CKTOTAL, CKMB, CKMBINDEX, TROPONINI in the last 168 hours. BNP (last 3 results) No results for input(s): PROBNP in the last 8760 hours. HbA1C: No results for input(s): HGBA1C in the last 72 hours. CBG: No results for input(s): GLUCAP in the last 168 hours. Lipid Profile: No results for input(s): CHOL, HDL, LDLCALC, TRIG, CHOLHDL, LDLDIRECT in the last 72 hours. Thyroid Function Tests: No results for input(s): TSH, T4TOTAL, FREET4, T3FREE, THYROIDAB in the last 72 hours. Anemia Panel: No results for input(s): VITAMINB12, FOLATE, FERRITIN, TIBC, IRON, RETICCTPCT in the last 72 hours. Sepsis Labs:  Recent Labs Lab 05/31/16 1031 06/04/16 0549  LATICACIDVEN 2.0* 1.1    Recent Results (from the past 240 hour(s))  Blood culture (routine x 2)     Status: Abnormal   Collection Time: 05/29/16  7:57 PM  Result Value Ref Range Status   Specimen Description BLOOD RIGHT FOREARM  Final   Special Requests BOTTLES DRAWN AEROBIC AND ANAEROBIC 5CC  Final   Culture  Setup Time   Final    GRAM POSITIVE COCCI IN CHAINS IN BOTH AEROBIC AND ANAEROBIC BOTTLES CRITICAL RESULT CALLED TO, READ BACK BY AND VERIFIED WITH: A. MASTERS, PHARM AT 1135 ON [id number] BY Rhea Bleacher    Culture (A)  Final    STREPTOCOCCUS GROUP G SUSCEPTIBILITIES PERFORMED ON PREVIOUS CULTURE WITHIN THE LAST 5 DAYS.    Report Status 06/01/2016 FINAL   Final  Blood culture (routine x 2)     Status: Abnormal   Collection Time: 05/29/16  8:15 PM  Result Value Ref Range Status   Specimen Description BLOOD RIGHT HAND  Final   Special Requests BOTTLES DRAWN AEROBIC AND ANAEROBIC 5CC  Final   Culture  Setup Time   Final    GRAM POSITIVE COCCI IN CHAINS AEROBIC BOTTLE ONLY CRITICAL RESULT CALLED TO, READ BACK BY AND VERIFIED WITH: A. MASTERS, PHARM AT 1135 ON [id number] BY Rhea Bleacher    Culture STREPTOCOCCUS GROUP G (A)  Final   Report Status 06/06/2016 FINAL  Final   Organism ID, Bacteria STREPTOCOCCUS GROUP G  Final      Susceptibility   Streptococcus group g - MIC*    CLINDAMYCIN <=0.25 SENSITIVE Sensitive     AMPICILLIN <=0.25 SENSITIVE Sensitive  ERYTHROMYCIN <=0.12 SENSITIVE Sensitive     VANCOMYCIN 0.5 SENSITIVE Sensitive     CEFTRIAXONE <=0.12 SENSITIVE Sensitive     LEVOFLOXACIN 1 SENSITIVE Sensitive     PENICILLIN Value in next row Sensitive      SENSITIVE<0.06    * STREPTOCOCCUS GROUP G  Blood Culture ID Panel (Reflexed)     Status: Abnormal   Collection Time: 05/29/16  8:15 PM  Result Value Ref Range Status   Enterococcus species NOT DETECTED NOT DETECTED Final   Listeria monocytogenes NOT DETECTED NOT DETECTED Final   Staphylococcus species NOT DETECTED NOT DETECTED Final   Staphylococcus aureus NOT DETECTED NOT DETECTED Final   Streptococcus species DETECTED (A) NOT DETECTED Final    Comment: CRITICAL RESULT CALLED TO, READ BACK BY AND VERIFIED WITH: A MASTERS,PHARMD AT 1135 05/30/16 BY S YARBROUGH    Streptococcus agalactiae NOT DETECTED NOT DETECTED Final   Streptococcus pneumoniae NOT DETECTED NOT DETECTED Final   Streptococcus pyogenes NOT DETECTED NOT DETECTED Final   Acinetobacter baumannii NOT DETECTED NOT DETECTED Final   Enterobacteriaceae species NOT DETECTED NOT DETECTED Final   Enterobacter cloacae complex NOT DETECTED NOT DETECTED Final   Escherichia coli NOT DETECTED NOT DETECTED Final   Klebsiella  oxytoca NOT DETECTED NOT DETECTED Final   Klebsiella pneumoniae NOT DETECTED NOT DETECTED Final   Proteus species NOT DETECTED NOT DETECTED Final   Serratia marcescens NOT DETECTED NOT DETECTED Final   Haemophilus influenzae NOT DETECTED NOT DETECTED Final   Neisseria meningitidis NOT DETECTED NOT DETECTED Final   Pseudomonas aeruginosa NOT DETECTED NOT DETECTED Final   Candida albicans NOT DETECTED NOT DETECTED Final   Candida glabrata NOT DETECTED NOT DETECTED Final   Candida krusei NOT DETECTED NOT DETECTED Final   Candida parapsilosis NOT DETECTED NOT DETECTED Final   Candida tropicalis NOT DETECTED NOT DETECTED Final  Urine culture     Status: Abnormal   Collection Time: 05/29/16  9:18 PM  Result Value Ref Range Status   Specimen Description URINE, CLEAN CATCH  Final   Special Requests NONE  Final   Culture MULTIPLE SPECIES PRESENT, SUGGEST RECOLLECTION (A)  Final   Report Status 05/31/2016 FINAL  Final  Culture, blood (routine x 2)     Status: None (Preliminary result)   Collection Time: 06/01/16  2:38 PM  Result Value Ref Range Status   Specimen Description BLOOD BLOOD LEFT ARM  Final   Special Requests BOTTLES DRAWN AEROBIC ONLY 5CC  Final   Culture NO GROWTH 4 DAYS  Final   Report Status PENDING  Incomplete  Culture, blood (routine x 2)     Status: None (Preliminary result)   Collection Time: 06/01/16  2:45 PM  Result Value Ref Range Status   Specimen Description BLOOD LEFT ANTECUBITAL  Final   Special Requests BOTTLES DRAWN AEROBIC ONLY 5CC  Final   Culture NO GROWTH 4 DAYS  Final   Report Status PENDING  Incomplete      Radiology Studies: Dg Chest Port 1 View  Result Date: 06/04/2016 CLINICAL DATA:  Cough with increased wheezing EXAM: PORTABLE CHEST 1 VIEW COMPARISON:  06/02/2016 FINDINGS: Left CP angle is not included. Mild to moderate cardiomegaly. Prominent central pulmonary vessels but without overt edema. Atherosclerosis of the aorta. Hazy right infrahilar  atelectasis versus mild infiltrate. Bibasilar interstitial thickening could relate to central airways inflammation. IMPRESSION: 1. Mild to moderate cardiomegaly with prominent central pulmonary vessels but no overt edema 2. Mild right hazy infrahilar atelectasis. Interstitial opacities within  the bilateral lower lung zones could relate to interstitial inflammation. Electronically Signed   By: Donavan Foil M.D.   On: 06/04/2016 16:50      Scheduled Meds: . aspirin EC  81 mg Oral Once per day on Mon Thu  . cholecalciferol  2,000 Units Oral QODAY  . docusate sodium  100 mg Oral BID  . enoxaparin (LOVENOX) injection  65 mg Subcutaneous Q24H  . furosemide  40 mg Intravenous BID  . levothyroxine  50 mcg Oral QODAY  . levothyroxine  75 mcg Oral QODAY  . multivitamin with minerals  1 tablet Oral Once per day on Mon Thu  . penicillin g continuous IV infusion  12 Million Units Intravenous Q12H  . simvastatin  20 mg Oral Once per day on Mon Wed Fri   Continuous Infusions:   LOS: 8 days    Chipper Oman, MD [facility] Pager [phone]  If 7PM-7AM, please contact night-coverage [email or web address] Password [facility] 06/06/2016, 3:04 PM

## 2016-06-07 ENCOUNTER — Inpatient Hospital Stay (HOSPITAL_COMMUNITY): Payer: Medicare Other

## 2016-06-07 DIAGNOSIS — M79609 Pain in unspecified limb: Secondary | ICD-10-CM

## 2016-06-07 DIAGNOSIS — R609 Edema, unspecified: Secondary | ICD-10-CM

## 2016-06-07 MED ORDER — TORSEMIDE 20 MG PO TABS
20.0000 mg | ORAL_TABLET | Freq: Every day | ORAL | Status: DC
  Administered 2016-06-07 – 2016-06-08 (×2): 20 mg via ORAL
  Filled 2016-06-07 (×2): qty 1

## 2016-06-07 MED ORDER — SODIUM CHLORIDE 0.9% FLUSH: 10.0000 mL | INTRAVENOUS | Status: DC | PRN

## 2016-06-07 NOTE — Progress Notes (Signed)
Subjective:  No new complaints   Antibiotics:  Anti-infectives    Start     Dose/Rate Route Frequency Ordered Stop   06/06/16 1330  ampicillin (OMNIPEN) 12 g in sodium chloride 0.9 % 50 mL IVPB  Status:  Discontinued     12 g 150 mL/hr over 20 Minutes Intravenous Continuous 06/06/16 1131 06/06/16 1136   06/06/16 1330  ampicillin (OMNIPEN) 2 g in sodium chloride 0.9 % 50 mL IVPB  Status:  Discontinued     2 g 150 mL/hr over 20 Minutes Intravenous Every 4 hours 06/06/16 1136 06/06/16 1155   06/06/16 1300  penicillin G potassium 12 Million Units in dextrose 5 % 500 mL continuous infusion     12 Million Units 41.7 mL/hr over 12 Hours Intravenous Every 12 hours 06/06/16 1155     06/05/16 1730  ampicillin (OMNIPEN) 2 g in sodium chloride 0.9 % 50 mL IVPB  Status:  Discontinued     2 g 150 mL/hr over 20 Minutes Intravenous Every 4 hours 06/05/16 1713 06/06/16 1131   06/02/16 1800  ampicillin (OMNIPEN) 2 g in sodium chloride 0.9 % 50 mL IVPB  Status:  Discontinued     2 g 150 mL/hr over 20 Minutes Intravenous Every 6 hours 06/02/16 1452 06/05/16 1713   06/02/16 1500  clindamycin (CLEOCIN) IVPB 600 mg  Status:  Discontinued     600 mg 100 mL/hr over 30 Minutes Intravenous Every 8 hours 06/02/16 1452 06/04/16 1218   05/30/16 2200  cefTRIAXone (ROCEPHIN) 2 g in dextrose 5 % 50 mL IVPB  Status:  Discontinued     2 g 100 mL/hr over 30 Minutes Intravenous Every 24 hours 05/30/16 1229 06/02/16 1451   05/30/16 1100  vancomycin (VANCOCIN) 1,250 mg in sodium chloride 0.9 % 250 mL IVPB  Status:  Discontinued     1,250 mg 166.7 mL/hr over 90 Minutes Intravenous Every 12 hours 05/29/16 2345 05/30/16 1229   05/30/16 0600  piperacillin-tazobactam (ZOSYN) IVPB 3.375 g  Status:  Discontinued     3.375 g 12.5 mL/hr over 240 Minutes Intravenous Every 8 hours 05/29/16 2343 05/30/16 1229   05/29/16 2330  piperacillin-tazobactam (ZOSYN) IVPB 3.375 g     3.375 g 100 mL/hr over 30 Minutes  Intravenous  Once 05/29/16 2324 05/30/16 0015   05/29/16 2330  vancomycin (VANCOCIN) IVPB 1000 mg/200 mL premix  Status:  Discontinued     1,000 mg 200 mL/hr over 60 Minutes Intravenous  Once 05/29/16 2324 05/29/16 2336   05/29/16 2115  vancomycin (VANCOCIN) IVPB 1000 mg/200 mL premix     1,000 mg 200 mL/hr over 60 Minutes Intravenous  Once 05/29/16 2021 05/30/16 0700   05/29/16 2000  cefTRIAXone (ROCEPHIN) 1 g in dextrose 5 % 50 mL IVPB     1 g 100 mL/hr over 30 Minutes Intravenous  Once 05/29/16 1949 05/29/16 2347   05/29/16 2000  vancomycin (VANCOCIN) IVPB 1000 mg/200 mL premix     1,000 mg 200 mL/hr over 60 Minutes Intravenous  Once 05/29/16 1949 05/29/16 2122   05/29/16 0915  vancomycin (VANCOCIN) IVPB 1000 mg/200 mL premix  Status:  Discontinued     1,000 mg 200 mL/hr over 60 Minutes Intravenous  Once 05/29/16 2021 05/29/16 2021      Medications: Scheduled Meds: . aspirin EC  81 mg Oral Once per day on Mon Thu  . cholecalciferol  2,000 Units Oral QODAY  . docusate sodium  100 mg Oral BID  .  enoxaparin (LOVENOX) injection  65 mg Subcutaneous Q24H  . levothyroxine  50 mcg Oral QODAY  . levothyroxine  75 mcg Oral QODAY  . multivitamin with minerals  1 tablet Oral Once per day on Mon Thu  . penicillin g continuous IV infusion  12 Million Units Intravenous Q12H  . simvastatin  20 mg Oral Once per day on Mon Wed Fri  . torsemide  20 mg Oral Daily   Continuous Infusions: PRN Meds:.acetaminophen **OR** acetaminophen, bisacodyl, chlorpheniramine-HYDROcodone, famotidine, ondansetron **OR** ondansetron (ZOFRAN) IV, oxyCODONE-acetaminophen, traZODone    Objective: Weight change: -5 lb 11.2 oz (-2.586 kg)  Intake/Output Summary (Last 24 hours) at 06/07/16 1414 Last data filed at 06/07/16 1400  Gross per 24 hour  Intake             2180 ml  Output             3450 ml  Net            -1270 ml   Blood pressure 123/65, pulse 68, temperature 98.3 F (36.8 C), temperature source  Oral, resp. rate 18, height '6\' 5"'$  (1.956 m), weight 288 lb (130.6 kg), SpO2 96 %. Temp:  [97.9 F (36.6 C)-98.5 F (36.9 C)] 98.3 F (36.8 C) (11/26 0923) Pulse Rate:  [57-78] 68 (11/26 0923) Resp:  [17-18] 18 (11/26 0923) BP: (117-141)/(61-74) 123/65 (11/26 0923) SpO2:  [96 %-99 %] 96 % (11/26 0923) Weight:  [288 lb (130.6 kg)] 288 lb (130.6 kg) (11/25 2127)  Physical Exam: General: Alert and awake, oriented x3, not in any acute distress. HEENT: anicteric sclera,  EOMI CVS regular rate, normal r,  no murmur rubs or gallops Chest:  no wheezing, resp diStress Abdomen: soft nontender, nondistended, normal bowel sounds, Extremities: Skin:   Cellulitis continues to improve     06/04/16:     06/05/16:    06/06/16:    06/07/16:      Neuro: nonfocal  CBC:  CBC Latest Ref Rng & Units 06/06/2016 06/05/2016 06/04/2016  WBC 4.0 - 10.5 K/uL 7.0 7.5 9.0  Hemoglobin 13.0 - 17.0 g/dL 13.4 12.2(L) 12.2(L)  Hematocrit 39.0 - 52.0 % 40.6 36.9(L) 37.0(L)  Platelets 150 - 400 K/uL 268 218 168      BMET  Recent Labs  06/05/16 0419 06/06/16 0646  NA 138 140  K 3.2* 3.7  CL 101 103  CO2 27 28  GLUCOSE 113* 117*  BUN 13 12  CREATININE 0.93 0.90  CALCIUM 8.2* 8.5*     Liver Panel  No results for input(s): PROT, ALBUMIN, AST, ALT, ALKPHOS, BILITOT, BILIDIR, IBILI in the last 72 hours.     Sedimentation Rate No results for input(s): ESRSEDRATE in the last 72 hours. C-Reactive Protein No results for input(s): CRP in the last 72 hours.  Micro Results: Recent Results (from the past 720 hour(s))  Blood culture (routine x 2)     Status: Abnormal   Collection Time: 05/29/16  7:57 PM  Result Value Ref Range Status   Specimen Description BLOOD RIGHT FOREARM  Final   Special Requests BOTTLES DRAWN AEROBIC AND ANAEROBIC 5CC  Final   Culture  Setup Time   Final    GRAM POSITIVE COCCI IN CHAINS IN BOTH AEROBIC AND ANAEROBIC BOTTLES CRITICAL RESULT CALLED TO,  READ BACK BY AND VERIFIED WITH: A. MASTERS, PHARM AT 1135 ON [id number] BY Rhea Bleacher    Culture (A)  Final    STREPTOCOCCUS GROUP G SUSCEPTIBILITIES PERFORMED ON PREVIOUS CULTURE WITHIN THE  LAST 5 DAYS.    Report Status 06/01/2016 FINAL  Final  Blood culture (routine x 2)     Status: Abnormal   Collection Time: 05/29/16  8:15 PM  Result Value Ref Range Status   Specimen Description BLOOD RIGHT HAND  Final   Special Requests BOTTLES DRAWN AEROBIC AND ANAEROBIC 5CC  Final   Culture  Setup Time   Final    GRAM POSITIVE COCCI IN CHAINS AEROBIC BOTTLE ONLY CRITICAL RESULT CALLED TO, READ BACK BY AND VERIFIED WITH: A. MASTERS, PHARM AT 1135 ON [id number] BY Rhea Bleacher    Culture STREPTOCOCCUS GROUP G (A)  Final   Report Status 06/06/2016 FINAL  Final   Organism ID, Bacteria STREPTOCOCCUS GROUP G  Final      Susceptibility   Streptococcus group g - MIC*    CLINDAMYCIN <=0.25 SENSITIVE Sensitive     AMPICILLIN <=0.25 SENSITIVE Sensitive     ERYTHROMYCIN <=0.12 SENSITIVE Sensitive     VANCOMYCIN 0.5 SENSITIVE Sensitive     CEFTRIAXONE <=0.12 SENSITIVE Sensitive     LEVOFLOXACIN 1 SENSITIVE Sensitive     PENICILLIN Value in next row Sensitive      SENSITIVE<0.06    * STREPTOCOCCUS GROUP G  Blood Culture ID Panel (Reflexed)     Status: Abnormal   Collection Time: 05/29/16  8:15 PM  Result Value Ref Range Status   Enterococcus species NOT DETECTED NOT DETECTED Final   Listeria monocytogenes NOT DETECTED NOT DETECTED Final   Staphylococcus species NOT DETECTED NOT DETECTED Final   Staphylococcus aureus NOT DETECTED NOT DETECTED Final   Streptococcus species DETECTED (A) NOT DETECTED Final    Comment: CRITICAL RESULT CALLED TO, READ BACK BY AND VERIFIED WITH: A MASTERS,PHARMD AT 1135 05/30/16 BY S YARBROUGH    Streptococcus agalactiae NOT DETECTED NOT DETECTED Final   Streptococcus pneumoniae NOT DETECTED NOT DETECTED Final   Streptococcus pyogenes NOT DETECTED NOT DETECTED Final    Acinetobacter baumannii NOT DETECTED NOT DETECTED Final   Enterobacteriaceae species NOT DETECTED NOT DETECTED Final   Enterobacter cloacae complex NOT DETECTED NOT DETECTED Final   Escherichia coli NOT DETECTED NOT DETECTED Final   Klebsiella oxytoca NOT DETECTED NOT DETECTED Final   Klebsiella pneumoniae NOT DETECTED NOT DETECTED Final   Proteus species NOT DETECTED NOT DETECTED Final   Serratia marcescens NOT DETECTED NOT DETECTED Final   Haemophilus influenzae NOT DETECTED NOT DETECTED Final   Neisseria meningitidis NOT DETECTED NOT DETECTED Final   Pseudomonas aeruginosa NOT DETECTED NOT DETECTED Final   Candida albicans NOT DETECTED NOT DETECTED Final   Candida glabrata NOT DETECTED NOT DETECTED Final   Candida krusei NOT DETECTED NOT DETECTED Final   Candida parapsilosis NOT DETECTED NOT DETECTED Final   Candida tropicalis NOT DETECTED NOT DETECTED Final  Urine culture     Status: Abnormal   Collection Time: 05/29/16  9:18 PM  Result Value Ref Range Status   Specimen Description URINE, CLEAN CATCH  Final   Special Requests NONE  Final   Culture MULTIPLE SPECIES PRESENT, SUGGEST RECOLLECTION (A)  Final   Report Status 05/31/2016 FINAL  Final  Culture, blood (routine x 2)     Status: None   Collection Time: 06/01/16  2:38 PM  Result Value Ref Range Status   Specimen Description BLOOD BLOOD LEFT ARM  Final   Special Requests BOTTLES DRAWN AEROBIC ONLY 5CC  Final   Culture NO GROWTH 5 DAYS  Final   Report Status 06/06/2016 FINAL  Final  Culture, blood (routine x 2)     Status: None   Collection Time: 06/01/16  2:45 PM  Result Value Ref Range Status   Specimen Description BLOOD LEFT ANTECUBITAL  Final   Special Requests BOTTLES DRAWN AEROBIC ONLY 5CC  Final   Culture NO GROWTH 5 DAYS  Final   Report Status 06/06/2016 FINAL  Final    Studies/Results: No results found.    Assessment/Plan:  INTERVAL HISTORY: Edema of bit better  Today a bit worse per pt but he is  ambulating more  Cellulitis improved on my exam today under the 26th   Principal Problem:   Sepsis ([place]) Active Problems:   Cellulitis   Hypothyroidism   GERD (gastroesophageal reflux disease)   Sepsis due to Streptococcus agalactiae (HCC)   Cellulitis of left leg   Chronic diastolic CHF (congestive heart failure) (HCC)   Dilated cardiomyopathy (HCC)   Cellulitis of left heel   Cough   Streptococcal bacteremia    Matthew Robinson is a 70 y.o. male withcellulitis and associated streptococcal bacteremia:  I will switch to 24 hr infusion of PCN  Like to give him 2 weeks of parenteral therapy with day one being 06/01/2016 which the date of his first negative cultures.    Diagnosis:  STREP BACTEREMIA + CELLULITIS  Culture Result: GROUP G STREPTOCCUS  No Known Allergies  Discharge antibiotics: IV PCN continuous infusion  Duration: 2 WEEKS  End Date: DEC 3RD, 2017  [facility] Care Per Protocol:  Labs weekly while on IV antibiotics: _X_ CBC with differential _X_ BMP _X_ CRP _X_ ESR   _X_ Please pull PIC at completion of IV antibiotics __ Please leave PIC in place until doctor has seen patient or been notified  Fax weekly labs to [phone]  Clinic Follow Up Appt:  NEXT 2 WEEKS  We'll sign off for now please call with further questions.       LOS: 9 days   Alcide Evener 06/07/2016, 2:14 PM

## 2016-06-07 NOTE — Progress Notes (Signed)
PROGRESS NOTE [facility]   Matthew Robinson   [id number] DOB: 07-13-1946  DOA: 05/29/2016 PCP: Wenda Low, MD   Brief Narrative:  70 year old male with past medical history of hyperlipidemia, mitral valve prolapse, OSA on CPAP, and hypothyroidism. Who was admitted for sepsis secondary to cellulitis. She was place on IV antibiotics and IV fluids were given. Patient subsequently was complaining of shortness of breath which was happening few days prior to admission. Patient was found to have diastolic CHF was started on IV Lasix. Patient currently receiving treatment for cellulitis with ampicillin and getting diuresis.  Subjective: No changes, no new complaints. Redness and swelling improving. Doppler pending   Assessment & Plan: Cellulitis, with bacteremia positive for strep group G. - Sepsis resolved, improving  Leg less swollen and less erythematous  No evidence of deep infection or osteomyelitis reported an MRI Repeated blood cultures negative x 5 day ID placed patient on penicillin 24 hrs infusion  PICC line ordered - pending  Doppler US pending    Acute diastolic CHF - EF 0000000 no wall motion abnormalities, ? dilated cardiomyopathy. Switch IV lasix to oral Torsemide 20 mg  Strict I's and O's Daily weight No need for additional CHF medication given preserved ejection fraction. This can be treated as an outpatient. Will refer to cardiologist as outpatient   Cough - improved with treatment, likely to be related to pulm edema - resolved   Hypothyroidism  Stable  Continue synthroid  DVT prophylaxis: Lovenox Code Status: Full Family Communication: Wife at bedside Disposition Plan: discharge in tomorrow with PICC line and IV PCN   Consultants:  Dr. Carlyle Basques ID  Procedures/Significant Events:  11/21 Echocardiogram: LVEF=: 55% to 60%. - Right ventricle: moderately dilated.  - Right atrium:  moderately to severely dilated.  Cultures 11/17 blood right  forearm/hand positive strep group G 11/17 urine positive multiple species 11/20 blood 2 NGTD   Antimicrobials: Ampicillin 11/21>> Ceftriaxone 11/17>> 11/21 Clindamycin 11/21>>11/23 Zosyn 11/17>> 11/18 Vancomycin 11/17>> 11/18    Objective: Vitals:   06/06/16 2127 06/06/16 2128 06/07/16 0536 06/07/16 0923  BP:  121/61 117/67 123/65  Pulse:  66 (!) 57 68  Resp:  18 17 18   Temp:  98.5 F (36.9 C) 97.9 F (36.6 C) 98.3 F (36.8 C)  TempSrc:  Oral Oral Oral  SpO2:  96% 99% 96%  Weight: 130.6 kg (288 lb)     Height:        Intake/Output Summary (Last 24 hours) at 06/07/16 1307 Last data filed at 06/07/16 1303  Gross per 24 hour  Intake             2420 ml  Output             4110 ml  Net            -1690 ml   Filed Weights   05/31/16 2220 06/05/16 0915 06/06/16 2127  Weight: 132 kg (291 lb 0.1 oz) 133.2 kg (293 lb 11.2 oz) 130.6 kg (288 lb)    Examination:  General exam: Lying in bed comfortable  Respiratory system: CTA no wheezing or rales  Cardiovascular system: S1 & S2 heard, RRR.Marland Kitchen Extremities: R pedal edema 1+ pitting. Left LE erythema from the knee below, with marked edema 2-3 + pitting, warm and tender -> Erythema and swelling decreasing. Continues to improve   Data Reviewed: I have personally reviewed following labs and imaging studies  CBC:  Recent Labs Lab 06/01/16 1143 06/03/16 0535 06/04/16 0549 06/05/16 0419  06/06/16 0646  WBC 6.0 10.8* 9.0 7.5 7.0  NEUTROABS  --  7.5 6.2 4.5  --   HGB 13.0 13.1 12.2* 12.2* 13.4  HCT 38.8* 40.0 37.0* 36.9* 40.6  MCV 93.7 93.7 93.0 92.9 93.3  PLT 127* 162 168 218 [id number]   Basic Metabolic Panel:  Recent Labs Lab 06/03/16 0535 06/04/16 0549 06/05/16 0419 06/06/16 0646  NA 138 137 138 140  K 3.5 3.6 3.2* 3.7  CL 105 102 101 103  CO2 24 27 27 28   GLUCOSE 122* 117* 113* 117*  BUN 6 9 13 12   CREATININE 0.92 0.93 0.93 0.90  CALCIUM 8.3* 8.2* 8.2* 8.5*  MG  --  1.9 2.0 2.0   GFR: Estimated Creatinine  Clearance: 110.9 mL/min (by C-G formula based on SCr of 0.9 mg/dL). Liver Function Tests: No results for input(s): AST, ALT, ALKPHOS, BILITOT, PROT, ALBUMIN in the last 168 hours. No results for input(s): LIPASE, AMYLASE in the last 168 hours. No results for input(s): AMMONIA in the last 168 hours. Coagulation Profile: No results for input(s): INR, PROTIME in the last 168 hours. Cardiac Enzymes: No results for input(s): CKTOTAL, CKMB, CKMBINDEX, TROPONINI in the last 168 hours. BNP (last 3 results) No results for input(s): PROBNP in the last 8760 hours. HbA1C: No results for input(s): HGBA1C in the last 72 hours. CBG: No results for input(s): GLUCAP in the last 168 hours. Lipid Profile: No results for input(s): CHOL, HDL, LDLCALC, TRIG, CHOLHDL, LDLDIRECT in the last 72 hours. Thyroid Function Tests: No results for input(s): TSH, T4TOTAL, FREET4, T3FREE, THYROIDAB in the last 72 hours. Anemia Panel: No results for input(s): VITAMINB12, FOLATE, FERRITIN, TIBC, IRON, RETICCTPCT in the last 72 hours. Sepsis Labs:  Recent Labs Lab 06/04/16 0549  LATICACIDVEN 1.1    Recent Results (from the past 240 hour(s))  Blood culture (routine x 2)     Status: Abnormal   Collection Time: 05/29/16  7:57 PM  Result Value Ref Range Status   Specimen Description BLOOD RIGHT FOREARM  Final   Special Requests BOTTLES DRAWN AEROBIC AND ANAEROBIC 5CC  Final   Culture  Setup Time   Final    GRAM POSITIVE COCCI IN CHAINS IN BOTH AEROBIC AND ANAEROBIC BOTTLES CRITICAL RESULT CALLED TO, READ BACK BY AND VERIFIED WITH: A. MASTERS, PHARM AT 1135 ON [id number] BY Rhea Bleacher    Culture (A)  Final    STREPTOCOCCUS GROUP G SUSCEPTIBILITIES PERFORMED ON PREVIOUS CULTURE WITHIN THE LAST 5 DAYS.    Report Status 06/01/2016 FINAL  Final  Blood culture (routine x 2)     Status: Abnormal   Collection Time: 05/29/16  8:15 PM  Result Value Ref Range Status   Specimen Description BLOOD RIGHT HAND  Final   Special  Requests BOTTLES DRAWN AEROBIC AND ANAEROBIC 5CC  Final   Culture  Setup Time   Final    GRAM POSITIVE COCCI IN CHAINS AEROBIC BOTTLE ONLY CRITICAL RESULT CALLED TO, READ BACK BY AND VERIFIED WITH: A. MASTERS, PHARM AT 1135 ON [id number] BY Rhea Bleacher    Culture STREPTOCOCCUS GROUP G (A)  Final   Report Status 06/06/2016 FINAL  Final   Organism ID, Bacteria STREPTOCOCCUS GROUP G  Final      Susceptibility   Streptococcus group g - MIC*    CLINDAMYCIN <=0.25 SENSITIVE Sensitive     AMPICILLIN <=0.25 SENSITIVE Sensitive     ERYTHROMYCIN <=0.12 SENSITIVE Sensitive     VANCOMYCIN 0.5 SENSITIVE Sensitive  CEFTRIAXONE <=0.12 SENSITIVE Sensitive     LEVOFLOXACIN 1 SENSITIVE Sensitive     PENICILLIN Value in next row Sensitive      SENSITIVE<0.06    * STREPTOCOCCUS GROUP G  Blood Culture ID Panel (Reflexed)     Status: Abnormal   Collection Time: 05/29/16  8:15 PM  Result Value Ref Range Status   Enterococcus species NOT DETECTED NOT DETECTED Final   Listeria monocytogenes NOT DETECTED NOT DETECTED Final   Staphylococcus species NOT DETECTED NOT DETECTED Final   Staphylococcus aureus NOT DETECTED NOT DETECTED Final   Streptococcus species DETECTED (A) NOT DETECTED Final    Comment: CRITICAL RESULT CALLED TO, READ BACK BY AND VERIFIED WITH: A MASTERS,PHARMD AT 1135 05/30/16 BY S YARBROUGH    Streptococcus agalactiae NOT DETECTED NOT DETECTED Final   Streptococcus pneumoniae NOT DETECTED NOT DETECTED Final   Streptococcus pyogenes NOT DETECTED NOT DETECTED Final   Acinetobacter baumannii NOT DETECTED NOT DETECTED Final   Enterobacteriaceae species NOT DETECTED NOT DETECTED Final   Enterobacter cloacae complex NOT DETECTED NOT DETECTED Final   Escherichia coli NOT DETECTED NOT DETECTED Final   Klebsiella oxytoca NOT DETECTED NOT DETECTED Final   Klebsiella pneumoniae NOT DETECTED NOT DETECTED Final   Proteus species NOT DETECTED NOT DETECTED Final   Serratia marcescens NOT DETECTED NOT  DETECTED Final   Haemophilus influenzae NOT DETECTED NOT DETECTED Final   Neisseria meningitidis NOT DETECTED NOT DETECTED Final   Pseudomonas aeruginosa NOT DETECTED NOT DETECTED Final   Candida albicans NOT DETECTED NOT DETECTED Final   Candida glabrata NOT DETECTED NOT DETECTED Final   Candida krusei NOT DETECTED NOT DETECTED Final   Candida parapsilosis NOT DETECTED NOT DETECTED Final   Candida tropicalis NOT DETECTED NOT DETECTED Final  Urine culture     Status: Abnormal   Collection Time: 05/29/16  9:18 PM  Result Value Ref Range Status   Specimen Description URINE, CLEAN CATCH  Final   Special Requests NONE  Final   Culture MULTIPLE SPECIES PRESENT, SUGGEST RECOLLECTION (A)  Final   Report Status 05/31/2016 FINAL  Final  Culture, blood (routine x 2)     Status: None   Collection Time: 06/01/16  2:38 PM  Result Value Ref Range Status   Specimen Description BLOOD BLOOD LEFT ARM  Final   Special Requests BOTTLES DRAWN AEROBIC ONLY 5CC  Final   Culture NO GROWTH 5 DAYS  Final   Report Status 06/06/2016 FINAL  Final  Culture, blood (routine x 2)     Status: None   Collection Time: 06/01/16  2:45 PM  Result Value Ref Range Status   Specimen Description BLOOD LEFT ANTECUBITAL  Final   Special Requests BOTTLES DRAWN AEROBIC ONLY 5CC  Final   Culture NO GROWTH 5 DAYS  Final   Report Status 06/06/2016 FINAL  Final      Radiology Studies: No results found.    Scheduled Meds: . aspirin EC  81 mg Oral Once per day on Mon Thu  . cholecalciferol  2,000 Units Oral QODAY  . docusate sodium  100 mg Oral BID  . enoxaparin (LOVENOX) injection  65 mg Subcutaneous Q24H  . furosemide  40 mg Intravenous BID  . levothyroxine  50 mcg Oral QODAY  . levothyroxine  75 mcg Oral QODAY  . multivitamin with minerals  1 tablet Oral Once per day on Mon Thu  . penicillin g continuous IV infusion  12 Million Units Intravenous Q12H  . simvastatin  20 mg  Oral Once per day on Mon Wed Fri    Continuous Infusions: Penicillin    LOS: 9 days    Chipper Oman, MD [facility] Pager [phone]  If 7PM-7AM, please contact night-coverage [email or web address] Password [email or web address] 06/07/2016, 1:07 PM

## 2016-06-07 NOTE — Progress Notes (Signed)
Attempted to obtain consent for PICC placement.  Pt refusing at this time due to diuresis from Lasix dose.  Requests to attempt again this afternoon.  Albert RN notified.

## 2016-06-07 NOTE — Progress Notes (Signed)
VASCULAR LAB PRELIMINARY  PRELIMINARY  PRELIMINARY  PRELIMINARY  Left lower extremity venous duplex completed.    Preliminary report:  Left:  No evidence of DVT, superficial thrombosis, or Baker's cyst. Moderate interstitial fluid noted throughout the lower leg.  Aidenn Skellenger, RVS 06/07/2016, 3:02 PM

## 2016-06-07 NOTE — Progress Notes (Signed)
Peripherally Inserted Central Catheter/Midline Placement  The IV Nurse has discussed with the patient and/or persons authorized to consent for the patient, the purpose of this procedure and the potential benefits and risks involved with this procedure.  The benefits include less needle sticks, lab draws from the catheter, and the patient may be discharged home with the catheter. Risks include, but not limited to, infection, bleeding, blood clot (thrombus formation), and puncture of an artery; nerve damage and irregular heartbeat and possibility to perform a PICC exchange if needed/ordered by physician.  Alternatives to this procedure were also discussed.  Bard Power PICC patient education guide, fact sheet on infection prevention and patient information card has been provided to patient /or left at bedside.    PICC/Midline Placement Documentation  PICC Single Lumen 06/07/16 PICC Right Brachial 45 cm 0 cm (Active)  Indication for Insertion or Continuance of Line Home intravenous therapies (PICC only) 06/07/2016  5:25 PM  Exposed Catheter (cm) 0 cm 06/07/2016  5:25 PM  Site Assessment Clean;Dry;Intact 06/07/2016  5:25 PM  Dressing Type Transparent 06/07/2016  5:25 PM  Dressing Status Clean;Dry;Intact;Antimicrobial disc in place 06/07/2016  5:25 PM  Line Care Connections checked and tightened 06/07/2016  5:25 PM  Line Adjustment (NICU/IV Team Only) Yes 06/07/2016  5:25 PM  Dressing Intervention New dressing 06/07/2016  5:25 PM  Dressing Change Due 06/14/16 06/07/2016  5:25 PM       Rolena Infante 06/07/2016, 5:26 PM

## 2016-06-08 DIAGNOSIS — K219 Gastro-esophageal reflux disease without esophagitis: Secondary | ICD-10-CM

## 2016-06-08 DIAGNOSIS — G4733 Obstructive sleep apnea (adult) (pediatric): Secondary | ICD-10-CM | POA: Diagnosis not present

## 2016-06-08 DIAGNOSIS — R7881 Bacteremia: Secondary | ICD-10-CM

## 2016-06-08 DIAGNOSIS — I420 Dilated cardiomyopathy: Secondary | ICD-10-CM | POA: Diagnosis not present

## 2016-06-08 DIAGNOSIS — L03116 Cellulitis of left lower limb: Secondary | ICD-10-CM | POA: Diagnosis not present

## 2016-06-08 DIAGNOSIS — I5031 Acute diastolic (congestive) heart failure: Secondary | ICD-10-CM | POA: Diagnosis not present

## 2016-06-08 DIAGNOSIS — E039 Hypothyroidism, unspecified: Secondary | ICD-10-CM | POA: Diagnosis not present

## 2016-06-08 MED ORDER — POTASSIUM CHLORIDE ER 20 MEQ PO TBCR: EXTENDED_RELEASE_TABLET | ORAL | 0 refills | Status: DC

## 2016-06-08 MED ORDER — HEPARIN SOD (PORK) LOCK FLUSH 100 UNIT/ML IV SOLN
250.0000 [IU] | INTRAVENOUS | Status: AC | PRN
  Administered 2016-06-08: 250 [IU]

## 2016-06-08 MED ORDER — PENICILLIN G POTASSIUM 20000000 UNITS IJ SOLR: INTRAVENOUS | 0 refills | Status: AC

## 2016-06-08 MED ORDER — TORSEMIDE 20 MG PO TABS: 20.0000 mg | ORAL_TABLET | Freq: Every day | ORAL | 0 refills | Status: DC | PRN

## 2016-06-08 MED ORDER — DOCUSATE SODIUM 100 MG PO CAPS: 100.0000 mg | ORAL_CAPSULE | Freq: Two times a day (BID) | ORAL | 0 refills | Status: DC

## 2016-06-08 NOTE — Discharge Instructions (Signed)
STREP BACTEREMIA + CELLULITIS  Culture Result: GROUP G STREPTOCCUS  No Known Allergies  Discharge antibiotics: IV PCN continuous infusion  Duration: 2 WEEKS  End Date: DEC 3RD, 2017  PICC Care Per Protocol:  Labs weekly while on IV antibiotics: _X_ CBC with differential _X_ BMP _X_ CRP _X_ ESR   _X_ Please pull PIC at completion of IV antibiotics __ Please leave PIC in place until doctor has seen patient or been notified  Fax weekly labs to [phone]          Cellulitis, Adult Introduction Cellulitis is a skin infection. The infected area is usually red and sore. This condition occurs most often in the arms and lower legs. It is very important to get treated for this condition. Follow these instructions at home:  Take over-the-counter and prescription medicines only as told by your doctor.  If you were prescribed an antibiotic medicine, take it as told by your doctor. Do not stop taking the antibiotic even if you start to feel better.  Drink enough fluid to keep your pee (urine) clear or pale yellow.  Do not touch or rub the infected area.  Raise (elevate) the infected area above the level of your heart while you are sitting or lying down.  Place warm or cold wet cloths (warm or cold compresses) on the infected area. Do this as told by your doctor.  Keep all follow-up visits as told by your doctor. This is important. These visits let your doctor make sure your infection is not getting worse. Contact a doctor if:  You have a fever.  Your symptoms do not get better after 1-2 days of treatment.  Your bone or joint under the infected area starts to hurt after the skin has healed.  Your infection comes back. This can happen in the same area or another area.  You have a swollen bump in the infected area.  You have new symptoms.  You feel ill and also have muscle aches and pains. Get help right away if:  Your symptoms get worse.  You  feel very sleepy.  You throw up (vomit) or have watery poop (diarrhea) for a long time.  There are red streaks coming from the infected area.  Your red area gets larger.  Your red area turns darker. This information is not intended to replace advice given to you by your health care provider. Make sure you discuss any questions you have with your health care provider. Document Released: 12/16/2007 Document Revised: 12/05/2015 Document Reviewed: 05/08/2015  2017 Elsevier   Edema Edema is an abnormal buildup of fluids. It is more common in your legs and thighs. Painless swelling of the feet and ankles is more likely as a person ages. It also is common in looser skin, like around your eyes. Follow these instructions at home:  Keep the affected body part above the level of the heart while lying down.  Do not sit still or stand for a long time.  Do not put anything right under your knees when you lie down.  Do not wear tight clothes on your upper legs.  Exercise your legs to help the puffiness (swelling) go down.  Wear elastic bandages or support stockings as told by your doctor.  A low-salt diet may help lessen the puffiness.  Only take medicine as told by your doctor. Contact a doctor if:  Treatment is not working.  You have heart, liver, or kidney disease and notice that your skin looks puffy  or shiny.  You have puffiness in your legs that does not get better when you raise your legs.  You have sudden weight gain for no reason. Get help right away if:  You have shortness of breath or chest pain.  You cannot breathe when you lie down.  You have pain, redness, or warmth in the areas that are puffy.  You have heart, liver, or kidney disease and get edema all of a sudden.  You have a fever and your symptoms get worse all of a sudden. This information is not intended to replace advice given to you by your health care provider. Make sure you discuss any questions you have  with your health care provider. Document Released: 12/16/2007 Document Revised: 12/05/2015 Document Reviewed: 04/21/2013 Elsevier Interactive Patient Education  2017 [place] A central line is a thin tube (catheter) that is put in your vein to give you medicine or food. It may also be used to take blood for lab tests. There are different types of central lines. The type of central line you receive depends on how long it will be needed, your medical condition, and the condition of your veins.  HOME CARE   Follow your doctor's instructions for the type of central line that you have.  Change bandages as told by your doctor. Look for redness or irritation during a bandage change. If a bandage gets wet, have it changed right away.  Keep the area where the central line goes into your skin clean at all times.   Use saline solution to clear out (flush) your central line as told by your doctor.  Always wash your hands before changing bandages or rinsing out the central line.  If the central line accidentally gets pulled on, make sure that:  The bandage is okay.  There is no bleeding.  The line has not been pulled out.   Limit lifting, using your arm, or doing other activity as told by your doctor.   Swim or bathe only if your doctor approves. Your doctor can tell you how to keep your specific type of bandage from getting wet.   GET HELP RIGHT AWAY IF:   You have chills.  You have a fever.   You have shortness of breath or trouble breathing.   You have chest pain.   You feel your heart beating fast or "skipping" beats.   You feel dizzy or faint.  You have bleeding that does not stop from the site of the central line.   You have pain, redness, puffiness (swelling), or drainage at the site of the central line.   You have swelling in your neck, face, chest, or arm on the side of your central line.   Your central line is hard to rinse  out.  You do not get a blood return from the central line.  You have a hole or tear in your central line.   Your central line leaks when rinsed out or when fluids are put into it. MAKE SURE YOU:   Understand these instructions.   Will watch your condition.   Will get help right away if you are not doing well or get worse.  This information is not intended to replace advice given to you by your health care provider. Make sure you discuss any questions you have with your health care provider. Document Released: 06/15/2012 Document Revised: 10/21/2015 Document Reviewed: 06/15/2012 Elsevier Interactive Patient Education  2017 [employer].

## 2016-06-08 NOTE — Care Management Important Message (Signed)
Important Message  Patient Details  Name: Matthew Robinson MRN: [id number] Date of Birth: 09/06/1945   Medicare Important Message Given:  Yes    Nathen May 06/08/2016, 12:09 PM

## 2016-06-08 NOTE — Discharge Summary (Signed)
Physician Discharge Summary  Matthew Robinson SPQ:[id number] DOB: September 15, 1945 DOA: 05/29/2016  PCP: Wenda Low, MD  Admit date: 05/29/2016 Discharge date: 06/08/2016  Admitted From: Home  Disposition:  Home   Recommendations for Outpatient Follow-up:  1. Follow up with PCP in 1-2 weeks.   Follow up with ID in 2 weeks.  2. Continue IV antibiotics thru 06/14/16.  Remove PICC line when antibiotics completed.  3. Please obtain BMP/CBC in one week 4. Please follow up on the following pending results:  penicillin G potassium 12 Million Units in dextrose 5 % 500 mL continuous infusion     12 Million Units 41.7 mL/hr over 12 Hours  Diagnosis:  STREP BACTEREMIA + CELLULITIS  Culture Result: GROUP G STREPTOCCUS  No Known Allergies  Discharge antibiotics: IV PCN continuous infusion  Duration: 2 WEEKS  End Date: DEC 3RD, 2017  [facility] Care Per Protocol:  Labs weekly while on IV antibiotics: _X_ CBC with differential _X_ BMP _X_ CRP _X_ ESR   _X_ Please pull PIC at completion of IV antibiotics __ Please leave PIC in place until doctor has seen patient or been notified  Fax weekly labs to [phone]  Home Health: [facility] Equipment/Devices: PICC Line  Discharge Condition: STABLE CODE STATUS: FULL  Brief/Interim Summary: HPI: Matthew Robinson is a 70 y.o. male with medical history significant for hyperlipidemia, mitral valve prolapse who is being admitted to the hospital this evening due to sepsis from cellulitis. The patient is somewhat confused, history taken from the patient as well as his wife who is at the bedside in the ER this evening. It seems that over the last few days, the patient has had increasing swelling in his left lower extremity, this has caused what he perceived to be balance problems, and he has also been feeling somewhat short of breath the last few days. Today he went swimming, and according to his wife he says he was not able to swim as much as  usual and when he came home he was behaving somewhat strangely and acting mildly confused. Due to his complaints of shortness of breath, and his confusion, his wife took him to urgent care from where he was referred to the emergency department. He denies having any chest pain, nausea, vomiting, fevers, chills. Denies any injury or cuts on his skin especially his lower cavity. He does have a history of peripheral edema for which she takes when necessary oral Lasix, but this first time that he has had unilateral edema.   ED Course: In the emergency department, he was found to have an elevated lactic acid, fever, leukocytosis, and evidence of left lower extremity cellulitis. He was started on IV vancomycin as well as IV Rocephin and given IV fluids. He was not hypotensive.  Brief Narrative:  70 year old male with past medical history of hyperlipidemia, mitral valve prolapse, OSA on CPAP, and hypothyroidism. Who was admitted for sepsis secondary to cellulitis. She was place on IV antibiotics and IV fluids were given. Patient subsequently was complaining of shortness of breath which was happening few days prior to admission. Patient was found to have diastolic CHF was started on IV Lasix. Patient currently receiving treatment for cellulitis with ampicillin and getting diuresis.  Subjective: No changes, no new complaints. Redness and swelling improving. Doppler negative for thrombosis.  Wants to go home, PICC line in place.   Assessment & Plan: Cellulitis, with bacteremia positive for strep group G. - Sepsis resolved, improving  Leg less swollen and less erythematous  No evidence of deep infection or osteomyelitis reported an MRI Repeated blood cultures negative x 5 day ID placed patient on penicillin 24 hrs infusion, to continue thru 06/14/16.   PICC line placed 56/38  Acute diastolic CHF - EF 75-64% no wall motion abnormalities, ? dilated cardiomyopathy. Switch IV lasix to oral Torsemide 20 mg    Strict I's and O's Daily weight No need for additional CHF medication given preserved ejection fraction. Referred to [place] to establish care as outpatient.   Cough - improved with treatment, likely to be related to pulm edema - resolved   Hypothyroidism  Stable  Continue synthroid  DVT prophylaxis:Lovenox Code Status:Full Family Communication:Wife at bedside Disposition Plan:discharge in tomorrow with PICC line and IV PCN   Consultants: Dr. Caren Griffins Snider/ C. Tommy Medal ID  Procedures/Significant Events: 11/21 Echocardiogram:LVEF=: 55% to 60%. - Right ventricle: moderately dilated.  - Right atrium: moderately to severely dilated.  Cultures 11/17 blood right forearm/handpositivestrep group G 11/17 urine positive multiple species 11/20 blood 2 NGTD   Antimicrobials: Ampicillin 11/21>> Ceftriaxone 11/17>> 11/21 Clindamycin 11/21>>11/23 Zosyn 11/17>> 11/18 Vancomycin 11/17>> 11/18 Penicillin G 11/25 - - - >   Discharge Diagnoses:  Principal Problem:   Sepsis ([place]) Active Problems:   Cellulitis   Hypothyroidism   GERD (gastroesophageal reflux disease)   Sepsis due to Streptococcus agalactiae (H[facility])   Cellulitis of left leg   Chronic diastolic CHF (congestive heart failure) ([place])   Dilated cardiomyopathy (HCC)   Cellulitis of left heel   Cough   Streptococcal bacteremia  Discharge Instructions     Medication List    STOP taking these medications   cyclobenzaprine 10 MG tablet Commonly known as:  FLEXERIL   furosemide 20 MG tablet Commonly known as:  LASIX   oxyCODONE-acetaminophen 5-325 MG tablet Commonly known as:  PERCOCET/ROXICET     TAKE these medications   aspirin EC 81 MG tablet Take 81 mg by mouth 2 (two) times a week.   docusate sodium 100 MG capsule Commonly known as:  COLACE Take 1 capsule (100 mg total) by mouth 2 (two) times daily.   famotidine 20 MG tablet Commonly known as:  PEPCID Take 20 mg by  mouth daily as needed for heartburn.   Fish Oil 1200 MG Caps Take 1,200 mg by mouth 2 (two) times a week.   levothyroxine 75 MCG tablet Commonly known as:  SYNTHROID, LEVOTHROID Take 75 mcg by mouth every other day. Alternates with 50 mcg strength   levothyroxine 50 MCG tablet Commonly known as:  SYNTHROID, LEVOTHROID Take 50 mcg by mouth every other day. Alternates with 75 mcg strength   multivitamin with minerals Tabs tablet Take 1 tablet by mouth 2 (two) times a week.   penicillin G potassium in dextrose 5 % 500 mL penicillin G potassium 12 Million Units in dextrose 5 % 500 mL continuous infusion:  Take 12 Million Units (41.7 mL/hr) over 12 Hours as continuous infusion through end date: 06/14/16   Potassium Chloride ER 20 MEQ Tbcr Take 1 tab daily when taking torsemide   simvastatin 20 MG tablet Commonly known as:  ZOCOR Take 20 mg by mouth 3 (three) times a week.   torsemide 20 MG tablet Commonly known as:  DEMADEX Take 1 tablet (20 mg total) by mouth daily as needed (edema).   Vitamin D 2000 units tablet Take 2,000 Units by mouth every other day.      Follow-up Information    HUSAIN,KARRAR, MD Follow up.  Specialty:  Internal Medicine Contact information: [address] [place] [address] [phone]        Cornelius Van Dam, MD. Schedule an appointment as soon as possible for a visit in 2 week(s).   Specialty:  Infectious Diseases Contact information: [address] [address] [phone]          No Known Allergies  Procedures/Studies: Dg Chest 1 View  Result Date: 06/02/2016 CLINICAL DATA:  Cough for 3 days, shortness of Breath EXAM: CHEST 1 VIEW COMPARISON:  05/29/2016 FINDINGS: Borderline cardiomegaly. No infiltrate or pulmonary edema. Mild basilar atelectasis. IMPRESSION: Borderline cardiomegaly. No infiltrate or pulmonary edema. Mild basilar atelectasis. Electronically Signed   By: Lahoma Crocker M.D.   On:  06/02/2016 18:34   Dg Chest 2 View  Result Date: 05/29/2016 CLINICAL DATA:  shortness of breath, dizziness, peripheral edema EXAM: CHEST  2 VIEW COMPARISON:  Report from 10/08/1999 FINDINGS: Thoracic spondylosis. Upper normal heart size. The lungs appear clear. Atherosclerotic calcification of the aortic arch noted. No pleural effusion identified. IMPRESSION: 1.  No active cardiopulmonary disease is radiographically apparent. 2. Atherosclerotic aortic arch. Electronically Signed   By: Van Clines M.D.   On: 05/29/2016 16:25   Mr Ankle Left Wo Contrast  Result Date: 05/31/2016 CLINICAL DATA:  Cellulitis left lower leg and heel with diffuse with swelling and erythema for a few days. Fever. EXAM: MRI OF THE LEFT ANKLE WITHOUT CONTRAST TECHNIQUE: Multiplanar, multisequence MR imaging of the ankle was performed. No intravenous contrast was administered. COMPARISON:  None. FINDINGS: Intense and diffuse subcutaneous edema about the left ankle is identified. No focal fluid collection is seen. TENDONS Peroneal: Intact. Trace amount of fluid is seen in the sheath of the tendons, likely incidental. Posteromedial: Intact. Anterior: Intact. Achilles: Intact. Plantar Fascia: Mildly increased T2 signal is seen in the medial cord. The plantar fascia is intact. Plantar calcaneal spur is noted. LIGAMENTS Lateral: Intact. Medial: Intact. CARTILAGE Ankle Joint: Negative. Subtalar Joints/Sinus Tarsi: Negative. Bones: No marrow signal abnormality to suggest osteomyelitis is identified. No fracture, stress change or worrisome lesion. Very small calcaneocuboid joint effusion is incidentally noted. Other: None. IMPRESSION: Diffuse and intense subcutaneous edema consistent with cellulitis. Negative for abscess, osteomyelitis or septic joint. Mild plantar fasciitis. Electronically Signed   By: Inge Rise M.D.   On: 05/31/2016 12:09   Dg Chest Port 1 View  Result Date: 06/07/2016 CLINICAL DATA:  Central line  placement EXAM: PORTABLE CHEST 1 VIEW COMPARISON:  06/04/2016 FINDINGS: Right PICC line tip:  SVC.  No pneumothorax. Atherosclerotic aortic arch. Stable enlargement of the cardiopericardial silhouette. Mild interstitial opacity bilaterally with suspected bibasilar subsegmental atelectasis. IMPRESSION: 1. Right PICC line tip: SVC. No pneumothorax or complicating feature. 2. Stable cardiomegaly.  Stable interstitial accentuation. Electronically Signed   By: Van Clines M.D.   On: 06/07/2016 17:38   Dg Chest Port 1 View  Result Date: 06/04/2016 CLINICAL DATA:  Cough with increased wheezing EXAM: PORTABLE CHEST 1 VIEW COMPARISON:  06/02/2016 FINDINGS: Left CP angle is not included. Mild to moderate cardiomegaly. Prominent central pulmonary vessels but without overt edema. Atherosclerosis of the aorta. Hazy right infrahilar atelectasis versus mild infiltrate. Bibasilar interstitial thickening could relate to central airways inflammation. IMPRESSION: 1. Mild to moderate cardiomegaly with prominent central pulmonary vessels but no overt edema 2. Mild right hazy infrahilar atelectasis. Interstitial opacities within the bilateral lower lung zones could relate to interstitial inflammation. Electronically Signed   By: Madie Reno.D.  On: 06/04/2016 16:50    Discharge Exam: Vitals:   06/07/16 2150 06/08/16 0607  BP: 122/67 121/66  Pulse: 66 66  Resp: 17 16  Temp: 98.7 F (37.1 C) 98.7 F (37.1 C)   Vitals:   06/07/16 0923 06/07/16 1854 06/07/16 2150 06/08/16 0607  BP: 123/65 131/64 122/67 121/66  Pulse: 68 75 66 66  Resp: _0 Temp: 98.3 F (36.8 C) 98.5 F (36.9 C) 98.7 F (37.1 C) 98.7 F (37.1 C)  TempSrc: Oral Oral Oral Oral  SpO2: 96% 100% 97% 99%  Weight:   130.7 kg (288 lb 2.3 oz)   Height:        General: Pt is alert, awake, not in acute distress Cardiovascular: RRR, S1/S2 +, no rubs, no gallops Respiratory: CTA bilaterally, no wheezing, no rhonchi Abdominal:  Soft, NT, ND, bowel sounds +        The results of significant diagnostics from this hospitalization (including imaging, microbiology, ancillary and laboratory) are listed below for reference.     Microbiology: Recent Results (from the past 240 hour(s))  Blood culture (routine x 2)     Status: Abnormal   Collection Time: 05/29/16  7:57 PM  Result Value Ref Range Status   Specimen Description BLOOD RIGHT FOREARM  Final   Special Requests BOTTLES DRAWN AEROBIC AND ANAEROBIC 5CC  Final   Culture  Setup Time   Final    GRAM POSITIVE COCCI IN CHAINS IN BOTH AEROBIC AND ANAEROBIC BOTTLES CRITICAL RESULT CALLED TO, READ BACK BY AND VERIFIED WITH: A. MASTERS, PHARM AT 1135 ON [id number] BY Rhea Bleacher    Culture (A)  Final    STREPTOCOCCUS GROUP G SUSCEPTIBILITIES PERFORMED ON PREVIOUS CULTURE WITHIN THE LAST 5 DAYS.    Report Status 06/01/2016 FINAL  Final  Blood culture (routine x 2)     Status: Abnormal   Collection Time: 05/29/16  8:15 PM  Result Value Ref Range Status   Specimen Description BLOOD RIGHT HAND  Final   Special Requests BOTTLES DRAWN AEROBIC AND ANAEROBIC 5CC  Final   Culture  Setup Time   Final    GRAM POSITIVE COCCI IN CHAINS AEROBIC BOTTLE ONLY CRITICAL RESULT CALLED TO, READ BACK BY AND VERIFIED WITH: A. MASTERS, PHARM AT 0454 ON [id number] BY Rhea Bleacher    Culture STREPTOCOCCUS GROUP G (A)  Final   Report Status 06/06/2016 FINAL  Final   Organism ID, Bacteria STREPTOCOCCUS GROUP G  Final      Susceptibility   Streptococcus group g - MIC*    CLINDAMYCIN <=0.25 SENSITIVE Sensitive     AMPICILLIN <=0.25 SENSITIVE Sensitive     ERYTHROMYCIN <=0.12 SENSITIVE Sensitive     VANCOMYCIN 0.5 SENSITIVE Sensitive     CEFTRIAXONE <=0.12 SENSITIVE Sensitive     LEVOFLOXACIN 1 SENSITIVE Sensitive     PENICILLIN Value in next row Sensitive      SENSITIVE<0.06    * STREPTOCOCCUS GROUP G  Blood Culture ID Panel (Reflexed)     Status: Abnormal   Collection Time: 05/29/16   8:15 PM  Result Value Ref Range Status   Enterococcus species NOT DETECTED NOT DETECTED Final   Listeria monocytogenes NOT DETECTED NOT DETECTED Final   Staphylococcus species NOT DETECTED NOT DETECTED Final   Staphylococcus aureus NOT DETECTED NOT DETECTED Final   Streptococcus species DETECTED (A) NOT DETECTED Final    Comment: CRITICAL RESULT CALLED TO, READ BACK BY AND VERIFIED WITH: A MASTERS,PHARMD AT 1135 05/30/16 BY  S YARBROUGH    Streptococcus agalactiae NOT DETECTED NOT DETECTED Final   Streptococcus pneumoniae NOT DETECTED NOT DETECTED Final   Streptococcus pyogenes NOT DETECTED NOT DETECTED Final   Acinetobacter baumannii NOT DETECTED NOT DETECTED Final   Enterobacteriaceae species NOT DETECTED NOT DETECTED Final   Enterobacter cloacae complex NOT DETECTED NOT DETECTED Final   Escherichia coli NOT DETECTED NOT DETECTED Final   Klebsiella oxytoca NOT DETECTED NOT DETECTED Final   Klebsiella pneumoniae NOT DETECTED NOT DETECTED Final   Proteus species NOT DETECTED NOT DETECTED Final   Serratia marcescens NOT DETECTED NOT DETECTED Final   Haemophilus influenzae NOT DETECTED NOT DETECTED Final   Neisseria meningitidis NOT DETECTED NOT DETECTED Final   Pseudomonas aeruginosa NOT DETECTED NOT DETECTED Final   Candida albicans NOT DETECTED NOT DETECTED Final   Candida glabrata NOT DETECTED NOT DETECTED Final   Candida krusei NOT DETECTED NOT DETECTED Final   Candida parapsilosis NOT DETECTED NOT DETECTED Final   Candida tropicalis NOT DETECTED NOT DETECTED Final  Urine culture     Status: Abnormal   Collection Time: 05/29/16  9:18 PM  Result Value Ref Range Status   Specimen Description URINE, CLEAN CATCH  Final   Special Requests NONE  Final   Culture MULTIPLE SPECIES PRESENT, SUGGEST RECOLLECTION (A)  Final   Report Status 05/31/2016 FINAL  Final  Culture, blood (routine x 2)     Status: None   Collection Time: 06/01/16  2:38 PM  Result Value Ref Range Status   Specimen  Description BLOOD BLOOD LEFT ARM  Final   Special Requests BOTTLES DRAWN AEROBIC ONLY 5CC  Final   Culture NO GROWTH 5 DAYS  Final   Report Status 06/06/2016 FINAL  Final  Culture, blood (routine x 2)     Status: None   Collection Time: 06/01/16  2:45 PM  Result Value Ref Range Status   Specimen Description BLOOD LEFT ANTECUBITAL  Final   Special Requests BOTTLES DRAWN AEROBIC ONLY 5CC  Final   Culture NO GROWTH 5 DAYS  Final   Report Status 06/06/2016 FINAL  Final     Labs: BNP (last 3 results)  Recent Labs  05/29/16 1753 05/29/16 1854  BNP 56.2 10.2   Basic Metabolic Panel:  Recent Labs Lab 06/03/16 0535 06/04/16 0549 06/05/16 0419 06/06/16 0646  NA 138 137 138 140  K 3.5 3.6 3.2* 3.7  CL 105 102 101 103  CO2 _0 GLUCOSE 122* 117* 113* 117*  BUN _1 CREATININE 0.92 0.93 0.93 0.90  CALCIUM 8.3* 8.2* 8.2* 8.5*  MG  --  1.9 2.0 2.0   Liver Function Tests: No results for input(s): AST, ALT, ALKPHOS, BILITOT, PROT, ALBUMIN in the last 168 hours. No results for input(s): LIPASE, AMYLASE in the last 168 hours. No results for input(s): AMMONIA in the last 168 hours. CBC:  Recent Labs Lab 06/01/16 1143 06/03/16 0535 06/04/16 0549 06/05/16 0419 06/06/16 0646  WBC 6.0 10.8* 9.0 7.5 7.0  NEUTROABS  --  7.5 6.2 4.5  --   HGB 13.0 13.1 12.2* 12.2* 13.4  HCT 38.8* 40.0 37.0* 36.9* 40.6  MCV 93.7 93.7 93.0 92.9 93.3  PLT 127* 162 168 218 268   Cardiac Enzymes: No results for input(s): CKTOTAL, CKMB, CKMBINDEX, TROPONINI in the last 168 hours. BNP: Invalid input(s): POCBNP CBG: No results for input(s): GLUCAP in the last 168 hours. D-Dimer No results for input(s): DDIMER in the last 72 hours.  Hgb A1c No results for input(s): HGBA1C in the last 72 hours. Lipid Profile No results for input(s): CHOL, HDL, LDLCALC, TRIG, CHOLHDL, LDLDIRECT in the last 72 hours. Thyroid function studies No results for input(s): TSH, T4TOTAL, T3FREE, THYROIDAB in  the last 72 hours.  Invalid input(s): FREET3 Anemia work up No results for input(s): VITAMINB12, FOLATE, FERRITIN, TIBC, IRON, RETICCTPCT in the last 72 hours. Urinalysis    Component Value Date/Time   COLORURINE AMBER (A) 05/29/2016 2117   APPEARANCEUR CLEAR 05/29/2016 2117   LABSPEC 1.024 05/29/2016 2117   PHURINE 5.0 05/29/2016 2117   GLUCOSEU NEGATIVE 05/29/2016 2117   HGBUR NEGATIVE 05/29/2016 2117   BILIRUBINUR NEGATIVE 05/29/2016 2117   BILIRUBINUR negative 05/29/2016 1627   KETONESUR NEGATIVE 05/29/2016 2117   PROTEINUR NEGATIVE 05/29/2016 2117   UROBILINOGEN 0.2 05/29/2016 1627   UROBILINOGEN 0.2 03/29/2007 1617   NITRITE NEGATIVE 05/29/2016 2117   LEUKOCYTESUR NEGATIVE 05/29/2016 2117   Sepsis Labs Invalid input(s): PROCALCITONIN,  WBC,  LACTICIDVEN Microbiology Recent Results (from the past 240 hour(s))  Blood culture (routine x 2)     Status: Abnormal   Collection Time: 05/29/16  7:57 PM  Result Value Ref Range Status   Specimen Description BLOOD RIGHT FOREARM  Final   Special Requests BOTTLES DRAWN AEROBIC AND ANAEROBIC 5CC  Final   Culture  Setup Time   Final    GRAM POSITIVE COCCI IN CHAINS IN BOTH AEROBIC AND ANAEROBIC BOTTLES CRITICAL RESULT CALLED TO, READ BACK BY AND VERIFIED WITH: A. MASTERS, PHARM AT 1135 ON [id number] BY Rhea Bleacher    Culture (A)  Final    STREPTOCOCCUS GROUP G SUSCEPTIBILITIES PERFORMED ON PREVIOUS CULTURE WITHIN THE LAST 5 DAYS.    Report Status 06/01/2016 FINAL  Final  Blood culture (routine x 2)     Status: Abnormal   Collection Time: 05/29/16  8:15 PM  Result Value Ref Range Status   Specimen Description BLOOD RIGHT HAND  Final   Special Requests BOTTLES DRAWN AEROBIC AND ANAEROBIC 5CC  Final   Culture  Setup Time   Final    GRAM POSITIVE COCCI IN CHAINS AEROBIC BOTTLE ONLY CRITICAL RESULT CALLED TO, READ BACK BY AND VERIFIED WITH: A. MASTERS, PHARM AT 7096 ON [id number] BY Rhea Bleacher    Culture STREPTOCOCCUS GROUP G (A)   Final   Report Status 06/06/2016 FINAL  Final   Organism ID, Bacteria STREPTOCOCCUS GROUP G  Final      Susceptibility   Streptococcus group g - MIC*    CLINDAMYCIN <=0.25 SENSITIVE Sensitive     AMPICILLIN <=0.25 SENSITIVE Sensitive     ERYTHROMYCIN <=0.12 SENSITIVE Sensitive     VANCOMYCIN 0.5 SENSITIVE Sensitive     CEFTRIAXONE <=0.12 SENSITIVE Sensitive     LEVOFLOXACIN 1 SENSITIVE Sensitive     PENICILLIN Value in next row Sensitive      SENSITIVE<0.06    * STREPTOCOCCUS GROUP G  Blood Culture ID Panel (Reflexed)     Status: Abnormal   Collection Time: 05/29/16  8:15 PM  Result Value Ref Range Status   Enterococcus species NOT DETECTED NOT DETECTED Final   Listeria monocytogenes NOT DETECTED NOT DETECTED Final   Staphylococcus species NOT DETECTED NOT DETECTED Final   Staphylococcus aureus NOT DETECTED NOT DETECTED Final   Streptococcus species DETECTED (A) NOT DETECTED Final    Comment: CRITICAL RESULT CALLED TO, READ BACK BY AND VERIFIED WITH: A MASTERS,PHARMD AT 1135 05/30/16 BY S YARBROUGH    Streptococcus agalactiae NOT DETECTED  NOT DETECTED Final   Streptococcus pneumoniae NOT DETECTED NOT DETECTED Final   Streptococcus pyogenes NOT DETECTED NOT DETECTED Final   Acinetobacter baumannii NOT DETECTED NOT DETECTED Final   Enterobacteriaceae species NOT DETECTED NOT DETECTED Final   Enterobacter cloacae complex NOT DETECTED NOT DETECTED Final   Escherichia coli NOT DETECTED NOT DETECTED Final   Klebsiella oxytoca NOT DETECTED NOT DETECTED Final   Klebsiella pneumoniae NOT DETECTED NOT DETECTED Final   Proteus species NOT DETECTED NOT DETECTED Final   Serratia marcescens NOT DETECTED NOT DETECTED Final   Haemophilus influenzae NOT DETECTED NOT DETECTED Final   Neisseria meningitidis NOT DETECTED NOT DETECTED Final   Pseudomonas aeruginosa NOT DETECTED NOT DETECTED Final   Candida albicans NOT DETECTED NOT DETECTED Final   Candida glabrata NOT DETECTED NOT DETECTED Final     Candida krusei NOT DETECTED NOT DETECTED Final   Candida parapsilosis NOT DETECTED NOT DETECTED Final   Candida tropicalis NOT DETECTED NOT DETECTED Final  Urine culture     Status: Abnormal   Collection Time: 05/29/16  9:18 PM  Result Value Ref Range Status   Specimen Description URINE, CLEAN CATCH  Final   Special Requests NONE  Final   Culture MULTIPLE SPECIES PRESENT, SUGGEST RECOLLECTION (A)  Final   Report Status 05/31/2016 FINAL  Final  Culture, blood (routine x 2)     Status: None   Collection Time: 06/01/16  2:38 PM  Result Value Ref Range Status   Specimen Description BLOOD BLOOD LEFT ARM  Final   Special Requests BOTTLES DRAWN AEROBIC ONLY 5CC  Final   Culture NO GROWTH 5 DAYS  Final   Report Status 06/06/2016 FINAL  Final  Culture, blood (routine x 2)     Status: None   Collection Time: 06/01/16  2:45 PM  Result Value Ref Range Status   Specimen Description BLOOD LEFT ANTECUBITAL  Final   Special Requests BOTTLES DRAWN AEROBIC ONLY 5CC  Final   Culture NO GROWTH 5 DAYS  Final   Report Status 06/06/2016 FINAL  Final   Time coordinating discharge: 33 minutes  SIGNED:  Irwin Brakeman, MD  [facility] 06/08/2016, 9:52 AM Pager   If 7PM-7AM, please contact night-coverage [email or web address] Password [email or web address]

## 2016-06-09 DIAGNOSIS — I420 Dilated cardiomyopathy: Secondary | ICD-10-CM | POA: Diagnosis not present

## 2016-06-09 DIAGNOSIS — I5031 Acute diastolic (congestive) heart failure: Secondary | ICD-10-CM | POA: Diagnosis not present

## 2016-06-09 DIAGNOSIS — G4733 Obstructive sleep apnea (adult) (pediatric): Secondary | ICD-10-CM | POA: Diagnosis not present

## 2016-06-09 DIAGNOSIS — K219 Gastro-esophageal reflux disease without esophagitis: Secondary | ICD-10-CM | POA: Diagnosis not present

## 2016-06-09 DIAGNOSIS — L03116 Cellulitis of left lower limb: Secondary | ICD-10-CM | POA: Diagnosis not present

## 2016-06-09 DIAGNOSIS — E039 Hypothyroidism, unspecified: Secondary | ICD-10-CM | POA: Diagnosis not present

## 2016-06-11 DIAGNOSIS — L03116 Cellulitis of left lower limb: Secondary | ICD-10-CM | POA: Diagnosis not present

## 2016-06-11 DIAGNOSIS — G4733 Obstructive sleep apnea (adult) (pediatric): Secondary | ICD-10-CM | POA: Diagnosis not present

## 2016-06-11 DIAGNOSIS — K219 Gastro-esophageal reflux disease without esophagitis: Secondary | ICD-10-CM | POA: Diagnosis not present

## 2016-06-11 DIAGNOSIS — E039 Hypothyroidism, unspecified: Secondary | ICD-10-CM | POA: Diagnosis not present

## 2016-06-11 DIAGNOSIS — I5031 Acute diastolic (congestive) heart failure: Secondary | ICD-10-CM | POA: Diagnosis not present

## 2016-06-11 DIAGNOSIS — I420 Dilated cardiomyopathy: Secondary | ICD-10-CM | POA: Diagnosis not present

## 2016-06-11 DIAGNOSIS — A419 Sepsis, unspecified organism: Secondary | ICD-10-CM | POA: Diagnosis not present

## 2016-06-15 ENCOUNTER — Ambulatory Visit (INDEPENDENT_AMBULATORY_CARE_PROVIDER_SITE_OTHER): Payer: Medicare Other | Admitting: Infectious Disease

## 2016-06-15 ENCOUNTER — Encounter: Payer: Self-pay | Admitting: Infectious Disease

## 2016-06-15 VITALS — BP 129/77 | HR 83 | Temp 98.0°F | Ht 77.5 in | Wt 273.0 lb

## 2016-06-15 DIAGNOSIS — L03116 Cellulitis of left lower limb: Secondary | ICD-10-CM

## 2016-06-15 DIAGNOSIS — I420 Dilated cardiomyopathy: Secondary | ICD-10-CM | POA: Diagnosis not present

## 2016-06-15 DIAGNOSIS — R7881 Bacteremia: Secondary | ICD-10-CM

## 2016-06-15 DIAGNOSIS — A401 Sepsis due to streptococcus, group B: Secondary | ICD-10-CM

## 2016-06-15 DIAGNOSIS — I5031 Acute diastolic (congestive) heart failure: Secondary | ICD-10-CM | POA: Diagnosis not present

## 2016-06-15 DIAGNOSIS — A409 Streptococcal sepsis, unspecified: Secondary | ICD-10-CM

## 2016-06-15 DIAGNOSIS — G4733 Obstructive sleep apnea (adult) (pediatric): Secondary | ICD-10-CM | POA: Diagnosis not present

## 2016-06-15 DIAGNOSIS — E039 Hypothyroidism, unspecified: Secondary | ICD-10-CM | POA: Diagnosis not present

## 2016-06-15 DIAGNOSIS — K219 Gastro-esophageal reflux disease without esophagitis: Secondary | ICD-10-CM | POA: Diagnosis not present

## 2016-06-15 DIAGNOSIS — B955 Unspecified streptococcus as the cause of diseases classified elsewhere: Secondary | ICD-10-CM

## 2016-06-15 NOTE — Patient Instructions (Signed)
Come back to our clinic for repeat blood cultures in 2 weeks time but no need for doctor visit

## 2016-06-15 NOTE — Progress Notes (Signed)
Subjective:   Chief complaint still concerned about his left leg being more edematous than the right leg. Also wanted to know if he can have his PICC line removed   Patient ID: Matthew Robinson, male    DOB: [birth year], 70 y.o.   MRN: [id number]  HPI  70 year old Caucasian male with admission to [facility] with cellulitis and group G streptococcal bacteremia. He was ultimately simple 5 down to 24 hour infusion of penicillin and has completed 2 weeks of parenteral therapy. His cellulitis has improved dramatically.  He is worried about whether he can take nonsteroidals which I'm sure he can. He is also worried about his labs were drawn through [facility] and we do not have in her possession. Turns other reason this happened was that his name was misspelled by [facility] but we are getting his labs in fairly short order.  Past Medical History:  Diagnosis Date  . Hyperlipemia   . Melanoma of skin ([place])    left shoulder  . Mitral valve prolapse    predental antibiotics  . Sleep apnea    cpap 16    Past Surgical History:  Procedure Laterality Date  . BACK SURGERY     4'14 lumbar fusion  . CATARACT EXTRACTION Left   . COLONOSCOPY     polyps in past  . COLONOSCOPY WITH PROPOFOL N/A 08/14/2014   Procedure: COLONOSCOPY WITH PROPOFOL;  Surgeon: Garlan Fair, MD;  Location: [facility];  Service: Endoscopy;  Laterality: N/A;  . EYE SURGERY     cataracts  . MELANOMA EXCISION    . MENISCUS REPAIR Left 1970  . RETINAL DETACHMENT SURGERY    . SPINE SURGERY     4'14 lumbar 3,4,5 fusion([place])  . TONSILLECTOMY      No family history on file.    Social History   Social History  . Marital status: Married    Spouse name: N/A  . Number of children: N/A  . Years of education: N/A   Social History Main Topics  . Smoking status: Never Smoker  . Smokeless tobacco: Never Used  . Alcohol use 0.0 oz/week    1 - 3 Glasses of wine per week     Comment: x2  weekly   . Drug use: No  . Sexual activity: Not Asked   Other Topics Concern  . None   Social History Narrative  . None    No Known Allergies   Current Outpatient Prescriptions:  .  aspirin EC 81 MG tablet, Take 81 mg by mouth 2 (two) times a week. , Disp: , Rfl:  .  Cholecalciferol (VITAMIN D) 2000 UNITS tablet, Take 2,000 Units by mouth every other day. , Disp: , Rfl:  .  docusate sodium (COLACE) 100 MG capsule, Take 1 capsule (100 mg total) by mouth 2 (two) times daily., Disp: 100 capsule, Rfl: 0 .  famotidine (PEPCID) 20 MG tablet, Take 20 mg by mouth daily as needed for heartburn. , Disp: , Rfl:  .  levothyroxine (SYNTHROID, LEVOTHROID) 50 MCG tablet, Take 50 mcg by mouth every other day. Alternates with 75 mcg strength, Disp: , Rfl:  .  levothyroxine (SYNTHROID, LEVOTHROID) 75 MCG tablet, Take 75 mcg by mouth every other day. Alternates with 50 mcg strength, Disp: , Rfl:  .  Multiple Vitamin (MULTIVITAMIN WITH MINERALS) TABS, Take 1 tablet by mouth 2 (two) times a week. , Disp: , Rfl:  .  Omega-3 Fatty Acids (FISH OIL) 1200  MG CAPS, Take 1,200 mg by mouth 2 (two) times a week. , Disp: , Rfl:  .  potassium chloride 20 MEQ TBCR, Take 1 tab daily when taking torsemide, Disp: 30 tablet, Rfl: 0 .  simvastatin (ZOCOR) 20 MG tablet, Take 20 mg by mouth 3 (three) times a week. , Disp: , Rfl:  .  torsemide (DEMADEX) 20 MG tablet, Take 1 tablet (20 mg total) by mouth daily as needed (edema)., Disp: 30 tablet, Rfl: 0    Review of Systems  Constitutional: Negative for chills, diaphoresis and fever.  HENT: Negative for congestion, hearing loss, sore throat and tinnitus.   Respiratory: Negative for cough, shortness of breath and wheezing.   Cardiovascular: Negative for chest pain, palpitations and leg swelling.  Gastrointestinal: Negative for abdominal pain, blood in stool, constipation, diarrhea, nausea and vomiting.  Genitourinary: Negative for dysuria, flank pain and hematuria.    Musculoskeletal: Negative for back pain and myalgias.  Skin: Positive for color change. Negative for rash.  Neurological: Negative for dizziness, weakness and headaches.  Hematological: Does not bruise/bleed easily.  Psychiatric/Behavioral: Negative for suicidal ideas. The patient is not nervous/anxious.        Objective:   Physical Exam  Constitutional: He is oriented to person, place, and time. He appears well-developed and well-nourished. No distress.  HENT:  Head: Normocephalic and atraumatic.  Mouth/Throat: No oropharyngeal exudate.  Eyes: Conjunctivae and EOM are normal. No scleral icterus.  Neck: Normal range of motion. Neck supple.  Cardiovascular: Normal rate and regular rhythm.   Pulmonary/Chest: Effort normal. No respiratory distress. He has no wheezes.  Abdominal: He exhibits no distension.  Musculoskeletal: He exhibits no edema or tenderness.  Neurological: He is alert and oriented to person, place, and time. He exhibits normal muscle tone. Coordination normal.  Skin: Skin is warm and dry. No rash noted. He is not diaphoretic. There is erythema. No pallor.  Psychiatric: He has a normal mood and affect. His behavior is normal. Judgment and thought content normal.    Left leg edema and erythema have improved substantially 06/15/2016:           Assessment & Plan:   Cellulitis with group G streptococcal bacteremia: Remove PICC line today and check surveillance cultures in 2 weeks' time.  Newly diagnosed congestive heart failure: To be seen by cardiology currently on diuretics I will defer to cardiology and primary care with regards to use of diuretics.  We spent greater than 25 minutes with the patient including greater than 50% of time in face to face counsel of the patient and his wife with regards to nature of his cellulitis bacteremia risk factors for recurrence in the future reason why maintenance labs were drawn and efforts to get the labs to clinic to review  and in coordination of  [facility].

## 2016-06-18 ENCOUNTER — Telehealth: Payer: Self-pay | Admitting: *Deleted

## 2016-06-18 ENCOUNTER — Encounter: Payer: Self-pay | Admitting: *Deleted

## 2016-06-18 NOTE — Telephone Encounter (Signed)
Patient called to request a letter for the airline stating he was in the hospital, when and why and that he had to be homebound due to IV antibiotic therapy. Advised if he does not get the letter he will forfeit $3000 on plane tickets. Advised will ask the doctor if he can write the letter and give him a call once I have an answer either way.

## 2016-06-18 NOTE — Telephone Encounter (Signed)
Travis,If you don't mindTyping out thatHe was in the hospitalFirst seriousCondition that requiredThe Administration of IV antibiotics at homeEnd of this prevented him from going on his flightI'll be happy to sign it

## 2016-06-21 DIAGNOSIS — G4733 Obstructive sleep apnea (adult) (pediatric): Secondary | ICD-10-CM | POA: Insufficient documentation

## 2016-06-21 DIAGNOSIS — E785 Hyperlipidemia, unspecified: Secondary | ICD-10-CM | POA: Insufficient documentation

## 2016-06-21 NOTE — Progress Notes (Signed)
Cardiology Office Note:    Date:  06/22/2016   ID:  Matthew Robinson, DOB October 23, 1945, MRN [id number]  PCP:  Wenda Low, MD  Cardiologist:  New - Dr. Dorris Carnes / Richardson Dopp, PA-C   Electrophysiologist:  n/a  Referring MD: Wenda Low, MD   Chief Complaint  Patient presents with  . 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  He was admitted 11/17-11/27 with LE cellulitis associated with strep bacteremia.  He developed volume excess and required IV Lasix.  Echo showed normal LVEF and mildly reduced RVSF.  He was not seen by Cardiology.  OP follow up was recommended. His cellulitis was tx with IV antibiotics and he was DC'd with a PICC line to continue antibiotics longer.  He has been followed by ID.    He is here today with his wife. He notes a chronic history of lower extremity edema. He describes L knee surgery many years ago. The discoloration in his leg has been somewhat chronic. He's never had cellulitis prior to this recent episode. He did take torsemide for several days after discharge. He recently stopped taking this on a daily basis. Prior to his cellulitis, he was very active. He would swim 5 days a week. He denies exertional chest pain or shortness of breath. He denies orthopnea. He sleeps with CPAP. As noted, he has chronic LE edema with the left leg being greater than the right. Denies syncope. Overall, his weight is down 10-15 pounds since admission to the hospital.   PAD Screen 06/22/2016  Previous PAD dx? Yes  Previous surgical procedure? No  Pain with walking? No  Feet/toe relief with dangling? No  Painful, non-healing ulcers? No  Extremities discolored? Yes      Prior CV studies that were reviewed today include:    Echo 06/02/16 EF 55 and 60, normal wall motion, trivial AI, moderate RVE, mildly reduced RVSF, moderate to severe RAE   Past Medical History:    Diagnosis Date  . Chronic right-sided CHF (congestive heart failure)    Echo 11/17: EF 55 and 60, normal wall motion, trivial AI, moderate RVE, mildly reduced RVSF, moderate to severe RAE   . Deafness in left ear   . Glucose intolerance (impaired glucose tolerance)   . Hyperlipemia    takes Simvastatin ~ every other day  . Melanoma of skin ([place])    left shoulder  . Mitral valve prolapse    hx of - seen years ago by Dr Enid Derry in [place], [place]  . Sleep apnea    cpap 16    Past Surgical History:  Procedure Laterality Date  . CATARACT EXTRACTION Left   . COLONOSCOPY     polyps in past  . COLONOSCOPY WITH PROPOFOL N/A 08/14/2014   Procedure: COLONOSCOPY WITH PROPOFOL;  Surgeon: Garlan Fair, MD;  Location: [facility];  Service: Endoscopy;  Laterality: N/A;  . MELANOMA EXCISION    . MENISCUS REPAIR Left 1970  . RETINAL DETACHMENT SURGERY    . SPINE SURGERY     4'14 lumbar 3,4,5 fusion([place])  . TONSILLECTOMY      Current Medications: Current Meds  Medication Sig  . aspirin EC 81 MG tablet Take 81 mg by mouth 2 (two) times a week.   . Cholecalciferol (VITAMIN D) 2000 UNITS tablet Take 2,000 Units by mouth every other day.   Marland Kitchen  famotidine (PEPCID) 20 MG tablet Take 20 mg by mouth daily as needed for heartburn.   . levothyroxine (SYNTHROID, LEVOTHROID) 50 MCG tablet Take 50 mcg by mouth every other day. Alternates with 75 mcg strength  . levothyroxine (SYNTHROID, LEVOTHROID) 75 MCG tablet Take 75 mcg by mouth every other day. Alternates with 50 mcg strength  . Multiple Vitamin (MULTIVITAMIN WITH MINERALS) TABS Take 1 tablet by mouth 2 (two) times a week.   . Omega-3 Fatty Acids (FISH OIL) 1200 MG CAPS Take 1,200 mg by mouth 2 (two) times a week.   . potassium chloride 20 MEQ TBCR Take 1 tab daily when taking torsemide  . simvastatin (ZOCOR) 20 MG tablet Take 20 mg by mouth 3 (three) times a week.   . torsemide (DEMADEX) 20 MG tablet Take 1 tablet (20 mg total)  by mouth daily as needed (edema).     Allergies:   Patient has no known allergies.   Social History   Social History  . Marital status: Married    Spouse name: N/A  . Number of children: N/A  . Years of education: N/A   Social History Main Topics  . Smoking status: Never Smoker  . Smokeless tobacco: Never Used  . Alcohol use 0.0 oz/week    1 - 3 Glasses of wine per week     Comment: x2 weekly   . Drug use: No  . Sexual activity: Not Asked   Other Topics Concern  . None   Social History Narrative   Married   1 daughter - deceased   Software engineer - retired; works a little        Family History:  The patient's family history includes Atrial fibrillation in his mother; Cancer in his father; Heart attack in his father; Heart disease in his mother; Heart failure in his mother; Hypertension in his father; Mitral valve prolapse in his mother.   ROS:   Please see the history of present illness.    Review of Systems  Cardiovascular: Positive for leg swelling.  Respiratory: Positive for snoring.   Musculoskeletal: Positive for joint pain.   All other systems reviewed and are negative.   EKGs/Labs/Other Test Reviewed:    EKG:  EKG is  ordered today.  The ekg ordered today demonstrates NSR, HR 69, normal axis, nonspecific ST-T wave changes, QTc 417 ms  Recent Labs: 05/29/2016: B Natriuretic Peptide 83.4; TSH 1.90 06/06/2016: BUN 12; Creatinine, Ser 0.90; Hemoglobin 13.4; Magnesium 2.0; Platelets 268; Potassium 3.7; Sodium 140   Recent Lipid Panel No results found for: CHOL, TRIG, HDL, CHOLHDL, VLDL, LDLCALC, LDLDIRECT   Physical Exam:    VS:  BP 116/70   Pulse 70   Ht 6' 5.5" (1.969 m)   Wt 279 lb (126.6 kg)   BMI 32.66 kg/m     Wt Readings from Last 3 Encounters:  06/22/16 279 lb (126.6 kg)  06/15/16 273 lb (123.8 kg)  06/07/16 288 lb 2.3 oz (130.7 kg)     Physical Exam  Constitutional: He is oriented to person, place, and time. He appears well-developed and  well-nourished. No distress.  HENT:  Head: Normocephalic and atraumatic.  Eyes: No scleral icterus.  Neck: No JVD present.  Cardiovascular: Normal rate, regular rhythm and normal heart sounds.   No murmur heard. Pulmonary/Chest: Effort normal. He has no wheezes. He has no rales.  Abdominal: Soft. There is no tenderness.  Musculoskeletal: He exhibits edema.  1+ L LE edema with discoloration  Neurological: He is  alert and oriented to person, place, and time.  Skin: Skin is warm and dry.  Psychiatric: He has a normal mood and affect.    ASSESSMENT:    1. Chronic right-sided CHF (congestive heart failure)   2. OSA (obstructive sleep apnea)   3. Pure hypercholesterolemia    PLAN:    In order of problems listed above:  1. R sided HF - He has a dilated RV and mildly reduced RVSF on echo during his admit for cellulitis and bacteremia.  He received a lot of IVFs and this likely led to his volume overload.  He is now off of daily diuretics and is doing well.  I have asked him to weigh himself daily and to take prn Torsemide if weight increases or LE edema worsens.  I suspect his RV dysfunction is related to his sleep apnea. It sounds as though his OSA was quite severe prior to being tx.  I reviewed his case and echocardiogram with Dr. Dorris Carnes today.  -  Continue PRN Torsemide  -  Repeat Echo in 6 mos.  2. HL - Managed by PCP. Continue statin.  3. OSA - Continue CPAP.     Medication Adjustments/Labs and Tests Ordered: Current medicines are reviewed at length with the patient today.  Concerns regarding medicines are outlined above.  Medication changes, Labs and Tests ordered today are outlined in the Patient Instructions noted below. Patient Instructions  Medication Instructions:  Your physician recommends that you continue on your current medications as directed. Please refer to the Current Medication list given to you today.  Labwork: NONE  Testing/Procedures: Your physician has  requested that you have an echocardiogram. Echocardiography is a painless test that uses sound waves to create images of your heart. It provides your doctor with information about the size and shape of your heart and how well your heart's chambers and valves are working. This procedure takes approximately one hour. There are no restrictions for this procedure.  THIS IS TO BE DONE IN 6 MONTHS  Follow-Up: 6 MONTHS WITH Kameria Canizares, PAC;  AFTER ECHO HAS BEEN COMPLETED  Any Other Special Instructions Will Be Listed Below (If Applicable).  If you need a refill on your cardiac medications before your next appointment, please call your pharmacy.  Signed, Richardson Dopp, PA-C  06/22/2016 3:34 PM    [facility] [place], [place], [place]  [address] Phone: [phone]; Fax: [phone]

## 2016-06-22 ENCOUNTER — Ambulatory Visit (INDEPENDENT_AMBULATORY_CARE_PROVIDER_SITE_OTHER): Payer: Medicare Other | Admitting: Physician Assistant

## 2016-06-22 ENCOUNTER — Encounter: Payer: Self-pay | Admitting: Physician Assistant

## 2016-06-22 ENCOUNTER — Ambulatory Visit: Payer: No Typology Code available for payment source | Admitting: Physician Assistant

## 2016-06-22 VITALS — BP 116/70 | HR 70 | Ht 77.5 in | Wt 279.0 lb

## 2016-06-22 DIAGNOSIS — E7800 Pure hypercholesterolemia, unspecified: Secondary | ICD-10-CM

## 2016-06-22 DIAGNOSIS — I50812 Chronic right heart failure: Secondary | ICD-10-CM | POA: Diagnosis not present

## 2016-06-22 DIAGNOSIS — G4733 Obstructive sleep apnea (adult) (pediatric): Secondary | ICD-10-CM | POA: Diagnosis not present

## 2016-06-22 NOTE — Patient Instructions (Addendum)
Medication Instructions:  Your physician recommends that you continue on your current medications as directed. Please refer to the Current Medication list given to you today.  Labwork: NONE  Testing/Procedures: Your physician has requested that you have an echocardiogram. Echocardiography is a painless test that uses sound waves to create images of your heart. It provides your doctor with information about the size and shape of your heart and how well your heart's chambers and valves are working. This procedure takes approximately one hour. There are no restrictions for this procedure.  THIS IS TO BE DONE IN 6 MONTHS  Follow-Up: 6 MONTHS WITH SCOTT WEAVER, PAC;  AFTER ECHO HAS BEEN COMPLETED  Any Other Special Instructions Will Be Listed Below (If Applicable).  If you need a refill on your cardiac medications before your next appointment, please call your pharmacy.

## 2016-06-24 DIAGNOSIS — I509 Heart failure, unspecified: Secondary | ICD-10-CM | POA: Diagnosis not present

## 2016-06-24 DIAGNOSIS — L0390 Cellulitis, unspecified: Secondary | ICD-10-CM | POA: Diagnosis not present

## 2016-06-24 DIAGNOSIS — A419 Sepsis, unspecified organism: Secondary | ICD-10-CM | POA: Diagnosis not present

## 2016-06-29 ENCOUNTER — Other Ambulatory Visit: Payer: Medicare Other

## 2016-06-29 DIAGNOSIS — R7881 Bacteremia: Principal | ICD-10-CM

## 2016-06-29 DIAGNOSIS — B955 Unspecified streptococcus as the cause of diseases classified elsewhere: Secondary | ICD-10-CM

## 2016-07-05 LAB — CULTURE, BLOOD (SINGLE)
Organism ID, Bacteria: NO GROWTH
Organism ID, Bacteria: NO GROWTH

## 2016-07-14 DIAGNOSIS — I872 Venous insufficiency (chronic) (peripheral): Secondary | ICD-10-CM | POA: Diagnosis not present

## 2016-07-14 DIAGNOSIS — I519 Heart disease, unspecified: Secondary | ICD-10-CM | POA: Diagnosis not present

## 2016-07-21 ENCOUNTER — Encounter: Payer: Self-pay | Admitting: Infectious Disease

## 2016-07-21 ENCOUNTER — Ambulatory Visit (INDEPENDENT_AMBULATORY_CARE_PROVIDER_SITE_OTHER): Payer: Medicare Other | Admitting: Infectious Disease

## 2016-07-21 VITALS — BP 133/77 | HR 71 | Temp 97.7°F | Ht 77.5 in | Wt 280.0 lb

## 2016-07-21 DIAGNOSIS — A401 Sepsis due to streptococcus, group B: Secondary | ICD-10-CM

## 2016-07-21 DIAGNOSIS — I50812 Chronic right heart failure: Secondary | ICD-10-CM

## 2016-07-21 DIAGNOSIS — L03116 Cellulitis of left lower limb: Secondary | ICD-10-CM | POA: Diagnosis not present

## 2016-07-21 DIAGNOSIS — A409 Streptococcal sepsis, unspecified: Secondary | ICD-10-CM | POA: Diagnosis not present

## 2016-07-21 DIAGNOSIS — R7881 Bacteremia: Secondary | ICD-10-CM | POA: Diagnosis not present

## 2016-07-21 DIAGNOSIS — G4733 Obstructive sleep apnea (adult) (pediatric): Secondary | ICD-10-CM

## 2016-07-21 DIAGNOSIS — B955 Unspecified streptococcus as the cause of diseases classified elsewhere: Secondary | ICD-10-CM

## 2016-07-21 NOTE — Progress Notes (Signed)
Subjective:   Chief complaint : Still suffering from bilateral lower exam edema left worse on right.  Patient ID: Matthew Robinson, male    DOB: January 07, 1946, 71 y.o.   MRN: [id number]  HPI  71 year old Caucasian male with admission to [facility] with cellulitis and group G streptococcal bacteremia. He was ultimately simple 5 down to 24 hour infusion of penicillin and has completed 2 weeks of parenteral therapy. His cellulitis has improved dramatically.  He completed antibiotics in early December but still has some erythema and edema that is worse on the left leg than the right. She'll has occasional shooting pains in left ankle as well. He is been seeing cardiology for new heart failure. He tells me that the concern is that his untreated sleep apnea has been driving much of his right-sided heart failure.  Do not take his diuretic today prior to coming to clinic which he says is one reasons why his lower rectum edema is worse today.    Past Medical History:  Diagnosis Date  . Chronic right-sided CHF (congestive heart failure)    Echo 11/17: EF 55 and 60, normal wall motion, trivial AI, moderate RVE, mildly reduced RVSF, moderate to severe RAE   . Deafness in left ear   . Glucose intolerance (impaired glucose tolerance)   . Hyperlipemia    takes Simvastatin ~ every other day  . Melanoma of skin ([place])    left shoulder  . Mitral valve prolapse    hx of - seen years ago by Dr Enid Derry in [place], [place]  . Sleep apnea    cpap 16    Past Surgical History:  Procedure Laterality Date  . CATARACT EXTRACTION Left   . COLONOSCOPY     polyps in past  . COLONOSCOPY WITH PROPOFOL N/A 08/14/2014   Procedure: COLONOSCOPY WITH PROPOFOL;  Surgeon: Garlan Fair, MD;  Location: [facility];  Service: Endoscopy;  Laterality: N/A;  . MELANOMA EXCISION    . MENISCUS REPAIR Left 1970  . RETINAL DETACHMENT SURGERY    . SPINE SURGERY     4'14 lumbar 3,4,5 fusion([place])  .  TONSILLECTOMY      Family History  Problem Relation Age of Onset  . Heart failure Mother   . Heart disease Mother   . Atrial fibrillation Mother   . Mitral valve prolapse Mother   . Hypertension Father   . Heart attack Father     died from MI during CA surgery  . Cancer Father       Social History   Social History  . Marital status: Married    Spouse name: N/A  . Number of children: N/A  . Years of education: N/A   Social History Main Topics  . Smoking status: Never Smoker  . Smokeless tobacco: Never Used  . Alcohol use 0.0 oz/week    1 - 3 Glasses of wine per week     Comment: x2 weekly   . Drug use: No  . Sexual activity: Not Asked   Other Topics Concern  . None   Social History Narrative   Married   1 daughter - deceased   Software engineer - retired; works a little       No Known Allergies   Current Outpatient Prescriptions:  .  aspirin EC 81 MG tablet, Take 81 mg by mouth 2 (two) times a week. , Disp: , Rfl:  .  Cholecalciferol (VITAMIN D) 2000 UNITS tablet, Take 2,000 Units by mouth  every other day. , Disp: , Rfl:  .  famotidine (PEPCID) 20 MG tablet, Take 20 mg by mouth daily as needed for heartburn. , Disp: , Rfl:  .  ibuprofen (ADVIL,MOTRIN) 200 MG tablet, Take 600 mg by mouth every morning., Disp: , Rfl:  .  levothyroxine (SYNTHROID, LEVOTHROID) 50 MCG tablet, Take 50 mcg by mouth every other day. Alternates with 75 mcg strength, Disp: , Rfl:  .  levothyroxine (SYNTHROID, LEVOTHROID) 75 MCG tablet, Take 75 mcg by mouth every other day. Alternates with 50 mcg strength, Disp: , Rfl:  .  Multiple Vitamin (MULTIVITAMIN WITH MINERALS) TABS, Take 1 tablet by mouth 2 (two) times a week. , Disp: , Rfl:  .  Omega-3 Fatty Acids (FISH OIL) 1200 MG CAPS, Take 1,200 mg by mouth 2 (two) times a week. , Disp: , Rfl:  .  potassium chloride 20 MEQ TBCR, Take 1 tab daily when taking torsemide, Disp: 30 tablet, Rfl: 0 .  simvastatin (ZOCOR) 20 MG tablet, Take 20 mg by mouth 3  (three) times a week. , Disp: , Rfl:  .  torsemide (DEMADEX) 20 MG tablet, Take 1 tablet (20 mg total) by mouth daily as needed (edema)., Disp: 30 tablet, Rfl: 0    Review of Systems  Constitutional: Negative for chills, diaphoresis and fever.  HENT: Negative for congestion, hearing loss, sore throat and tinnitus.   Respiratory: Negative for cough, shortness of breath and wheezing.   Cardiovascular: Negative for chest pain, palpitations and leg swelling.  Gastrointestinal: Negative for abdominal pain, blood in stool, constipation, diarrhea, nausea and vomiting.  Genitourinary: Negative for dysuria, flank pain and hematuria.  Musculoskeletal: Negative for back pain and myalgias.  Skin: Positive for color change. Negative for rash.  Neurological: Negative for dizziness, weakness and headaches.  Hematological: Does not bruise/bleed easily.  Psychiatric/Behavioral: Negative for suicidal ideas. The patient is not nervous/anxious.        Objective:   Physical Exam  Constitutional: He is oriented to person, place, and time. He appears well-developed and well-nourished. No distress.  HENT:  Head: Normocephalic and atraumatic.  Mouth/Throat: No oropharyngeal exudate.  Eyes: Conjunctivae and EOM are normal. No scleral icterus.  Neck: Normal range of motion. Neck supple.  Cardiovascular: Normal rate and regular rhythm.   Pulmonary/Chest: Effort normal. No respiratory distress. He has no wheezes.  Abdominal: He exhibits no distension.  Musculoskeletal: He exhibits no edema or tenderness.  Neurological: He is alert and oriented to person, place, and time. He exhibits normal muscle tone. Coordination normal.  Skin: Skin is warm and dry. No rash noted. He is not diaphoretic. There is erythema. No pallor.  Psychiatric: He has a normal mood and affect. His behavior is normal. Judgment and thought content normal.    Left leg edema and erythema have improved substantially  06/15/2016:     07/21/16:            Assessment & Plan:   Cellulitis with group G streptococcal bacteremia: Check surveillance cultures today.  Newly diagnosed congestive heart failure:followup with Cardiology and ensure using CPAP for OSA  Lower extremity edema: Patient is diuretics compliance with his treatment for sleep apnea but also benefit from compression stockings.

## 2016-07-28 LAB — CULTURE, BLOOD (SINGLE)
ORGANISM ID, BACTERIA: NO GROWTH
Organism ID, Bacteria: NO GROWTH

## 2016-08-24 DIAGNOSIS — Z1389 Encounter for screening for other disorder: Secondary | ICD-10-CM | POA: Diagnosis not present

## 2016-08-24 DIAGNOSIS — R7303 Prediabetes: Secondary | ICD-10-CM | POA: Diagnosis not present

## 2016-08-24 DIAGNOSIS — G4730 Sleep apnea, unspecified: Secondary | ICD-10-CM | POA: Diagnosis not present

## 2016-08-24 DIAGNOSIS — E7800 Pure hypercholesterolemia, unspecified: Secondary | ICD-10-CM | POA: Diagnosis not present

## 2016-08-24 DIAGNOSIS — E669 Obesity, unspecified: Secondary | ICD-10-CM | POA: Diagnosis not present

## 2016-08-24 DIAGNOSIS — Z6835 Body mass index (BMI) 35.0-35.9, adult: Secondary | ICD-10-CM | POA: Diagnosis not present

## 2016-08-24 DIAGNOSIS — E039 Hypothyroidism, unspecified: Secondary | ICD-10-CM | POA: Diagnosis not present

## 2016-08-24 DIAGNOSIS — R972 Elevated prostate specific antigen [PSA]: Secondary | ICD-10-CM | POA: Diagnosis not present

## 2016-08-24 DIAGNOSIS — I509 Heart failure, unspecified: Secondary | ICD-10-CM | POA: Diagnosis not present

## 2016-08-31 DIAGNOSIS — R972 Elevated prostate specific antigen [PSA]: Secondary | ICD-10-CM | POA: Diagnosis not present

## 2016-09-01 ENCOUNTER — Other Ambulatory Visit: Payer: Self-pay | Admitting: Urology

## 2016-09-01 DIAGNOSIS — R972 Elevated prostate specific antigen [PSA]: Secondary | ICD-10-CM

## 2016-09-15 ENCOUNTER — Ambulatory Visit (HOSPITAL_COMMUNITY)
Admission: RE | Admit: 2016-09-15 | Discharge: 2016-09-15 | Disposition: A | Discharge status: Home / Self Care | Payer: Medicare Other | Source: Ambulatory Visit | Attending: Urology | Admitting: Urology

## 2016-09-15 DIAGNOSIS — R972 Elevated prostate specific antigen [PSA]: Secondary | ICD-10-CM | POA: Diagnosis not present

## 2016-09-15 LAB — POCT I-STAT CREATININE: Creatinine, Ser: 0.9 mg/dL (ref 0.61–1.24)

## 2016-09-15 MED ORDER — GADOBENATE DIMEGLUMINE 529 MG/ML IV SOLN
20.0000 mL | Freq: Once | INTRAVENOUS | Status: AC | PRN
  Administered 2016-09-15: 20 mL via INTRAVENOUS

## 2016-09-15 MED ORDER — LIDOCAINE HCL 2 % EX GEL
CUTANEOUS | Status: AC
Start: 2016-09-15 — End: 2016-09-15
  Filled 2016-09-15: qty 30

## 2016-09-15 MED ORDER — LIDOCAINE HCL 2 % EX GEL: 1.0000 "application " | Freq: Once | CUTANEOUS | Status: DC

## 2016-10-05 DIAGNOSIS — R972 Elevated prostate specific antigen [PSA]: Secondary | ICD-10-CM | POA: Diagnosis not present

## 2016-10-26 DIAGNOSIS — L821 Other seborrheic keratosis: Secondary | ICD-10-CM | POA: Diagnosis not present

## 2016-10-26 DIAGNOSIS — D235 Other benign neoplasm of skin of trunk: Secondary | ICD-10-CM | POA: Diagnosis not present

## 2016-10-26 DIAGNOSIS — Z85820 Personal history of malignant melanoma of skin: Secondary | ICD-10-CM | POA: Diagnosis not present

## 2016-10-26 DIAGNOSIS — L570 Actinic keratosis: Secondary | ICD-10-CM | POA: Diagnosis not present

## 2016-10-26 DIAGNOSIS — B351 Tinea unguium: Secondary | ICD-10-CM | POA: Diagnosis not present

## 2016-10-26 DIAGNOSIS — L84 Corns and callosities: Secondary | ICD-10-CM | POA: Diagnosis not present

## 2016-11-25 DIAGNOSIS — H524 Presbyopia: Secondary | ICD-10-CM | POA: Diagnosis not present

## 2016-11-25 DIAGNOSIS — H25811 Combined forms of age-related cataract, right eye: Secondary | ICD-10-CM | POA: Diagnosis not present

## 2016-11-25 DIAGNOSIS — Z961 Presence of intraocular lens: Secondary | ICD-10-CM | POA: Diagnosis not present

## 2016-12-23 ENCOUNTER — Other Ambulatory Visit (HOSPITAL_COMMUNITY): Payer: Medicare Other

## 2016-12-24 ENCOUNTER — Other Ambulatory Visit: Payer: Self-pay

## 2016-12-24 ENCOUNTER — Ambulatory Visit (HOSPITAL_COMMUNITY): Payer: Medicare Other | Attending: Internal Medicine

## 2016-12-24 DIAGNOSIS — I420 Dilated cardiomyopathy: Secondary | ICD-10-CM | POA: Insufficient documentation

## 2016-12-24 DIAGNOSIS — I50812 Chronic right heart failure: Secondary | ICD-10-CM

## 2016-12-24 DIAGNOSIS — I5030 Unspecified diastolic (congestive) heart failure: Secondary | ICD-10-CM | POA: Insufficient documentation

## 2016-12-24 DIAGNOSIS — I061 Rheumatic aortic insufficiency: Secondary | ICD-10-CM | POA: Diagnosis not present

## 2016-12-25 ENCOUNTER — Encounter: Payer: Self-pay | Admitting: Physician Assistant

## 2016-12-28 ENCOUNTER — Ambulatory Visit (INDEPENDENT_AMBULATORY_CARE_PROVIDER_SITE_OTHER): Payer: Medicare Other | Admitting: Physician Assistant

## 2016-12-28 ENCOUNTER — Encounter: Payer: Self-pay | Admitting: Physician Assistant

## 2016-12-28 ENCOUNTER — Encounter (INDEPENDENT_AMBULATORY_CARE_PROVIDER_SITE_OTHER): Payer: Self-pay

## 2016-12-28 VITALS — BP 132/60 | HR 65 | Ht 78.0 in | Wt 280.1 lb

## 2016-12-28 DIAGNOSIS — I50810 Right heart failure, unspecified: Secondary | ICD-10-CM

## 2016-12-28 DIAGNOSIS — G4733 Obstructive sleep apnea (adult) (pediatric): Secondary | ICD-10-CM

## 2016-12-28 NOTE — Progress Notes (Signed)
Cardiology Office Note:    Date:  12/28/2016   ID:  Matthew Robinson, DOB 11/02/1945, MRN [id number]  PCP:  Wenda Low, MD  Cardiologist:  Dr. Dorris Carnes / Richardson Dopp, PA-C     Referring MD: Wenda Low, MD   Chief Complaint  Patient presents with  . Follow-up    edema/hx of R heart failure    History of Present Illness:    Matthew Robinson is a 71 y.o. male, semiretired pharmacist, with a hx of MVP, HL, OSA.  He was admitted 05/2016 with LE cellulitis associated with strep bacteremia.  He developed volume excess and required IV Lasix.  Echo showed normal LVEF and mildly reduced RVSF.  He was not seen by Cardiology.  OP follow up was recommended. His cellulitis was tx with IV antibiotics and he was DC'd with a PICC line to continue antibiotics longer. He followed up in our office in 12/17. I reviewed his echocardiogram with Dr. Harrington Challenger. His RV was dilated and he had mildly reduced RVSF. Repeat echocardiogram was obtained 12/24/16. This demonstrated normal LV function with mild diastolic dysfunction and normal RV systolic function.  Matthew Robinson returns for Cardiology follow up.  He is here alone.  He is doing well.  He takes Furosemide occasionally to control LE edema.  He went to [place] recently and only took 2 doses for edema.  He wears a compression stocking on his L leg.  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  He denies chest pain, shortness of breath, syncope, paroxysmal nocturnal dyspnea.    Prior CV studies:   The following studies were reviewed today:  Echo 12/24/16 - Left ventricle: The cavity size was normal. Wall thickness was   normal. Systolic function was normal. The estimated ejection   fraction was in the range of 60% to 65%. Doppler parameters are   consistent with abnormal left ventricular relaxation (grade 1   diastolic dysfunction). - Aortic valve: There was trivial regurgitation. - Right ventricle: The cavity size  was mildly dilated. - Right atrium: The atrium was mildly dilated.  Echo 06/02/16 EF 55 and 60, normal wall motion, trivial AI, moderate RVE, mildly reduced RVSF, moderate to severe RAE  Past Medical History:  Diagnosis Date  . Chronic right-sided CHF (congestive heart failure) ([place])    Echo 11/17: EF 55 and 60, normal wall motion, trivial AI, moderate RVE, mildly reduced RVSF, moderate to severe RAE // Echo 6/18: EF 60-65, Gr 1 DD, trivial AI, mild RVE, mild RAE.  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 Glucose intolerance (impaired glucose tolerance)   . Hyperlipemia    takes Simvastatin ~ every other day  . Melanoma of skin ([place])    left shoulder  . Mitral valve prolapse    hx of - seen years ago by Dr Enid Derry in [place], [place]  . Sleep apnea    cpap 16    Past Surgical History:  Procedure Laterality Date  . CATARACT EXTRACTION Left   . COLONOSCOPY     polyps in past  . COLONOSCOPY WITH PROPOFOL N/A 08/14/2014   Procedure: COLONOSCOPY WITH PROPOFOL;  Surgeon: Garlan Fair, MD;  Location: [facility] END[facility]COPY;  Service: Endoscopy;  Laterality: N/A;  . MELANOMA EXCISION    . MENISCUS REPAIR Left 1970  . RETINAL DETACHMENT SURGERY    .  SPINE SURGERY     4'14 lumbar 3,4,5 fusion([place])  . TONSILLECTOMY      Current Medications: Current Meds  Medication Sig  . aspirin EC 81 MG tablet Take 81 mg by mouth 2 (two) times a week.   . Cholecalciferol (VITAMIN D) 2000 UNITS tablet Take 2,000 Units by mouth every other day.   . famotidine (PEPCID) 20 MG tablet Take 20 mg by mouth daily as needed for heartburn.   . furosemide (LASIX) 40 MG tablet Take 40 mg by mouth daily as needed for edema.  Marland Kitchen ibuprofen (ADVIL,MOTRIN) 200 MG tablet Take 600 mg by mouth every morning.  Marland Kitchen levothyroxine (SYNTHROID, LEVOTHROID) 50 MCG tablet Take 50 mcg by mouth every other day. Alternates with 75 mcg strength  . levothyroxine (SYNTHROID,  LEVOTHROID) 75 MCG tablet Take 75 mcg by mouth every other day. Alternates with 50 mcg strength  . Multiple Vitamin (MULTIVITAMIN WITH MINERALS) TABS Take 1 tablet by mouth 2 (two) times a week.   . Omega-3 Fatty Acids (FISH OIL) 1200 MG CAPS Take 1,200 mg by mouth 2 (two) times a week.   . potassium chloride 20 MEQ TBCR Take 1 tab daily when taking torsemide  . simvastatin (ZOCOR) 20 MG tablet Take 20 mg by mouth 3 (three) times a week.   . torsemide (DEMADEX) 20 MG tablet Take 1 tablet (20 mg total) by mouth daily as needed (edema).     Allergies:   Patient has no known allergies.   Social History   Social History  . Marital status: Married    Spouse name: N/A  . Number of children: N/A  . Years of education: N/A   Social History Main Topics  . Smoking status: Never Smoker  . Smokeless tobacco: Never Used  . Alcohol use 0.0 oz/week    1 - 3 Glasses of wine per week     Comment: x2 weekly   . Drug use: No  . Sexual activity: Not Asked   Other Topics Concern  . None   Social History Narrative   Married   1 daughter - deceased   Software engineer - retired; works a little        Family Hx: The patient's family history includes Atrial fibrillation in his mother; Cancer in his father; Heart attack in his father; Heart disease in his mother; Heart failure in his mother; Hypertension in his father; Mitral valve prolapse in his mother.  ROS:   Please see the history of present illness.    ROS All other systems reviewed and are negative.   EKGs/Labs/Other Test Reviewed:    EKG:  EKG is not ordered today.  The ekg ordered today demonstrates n/a  Recent Labs: 05/29/2016: B Natriuretic Peptide 83.4; TSH 1.90 06/06/2016: BUN 12; Hemoglobin 13.4; Magnesium 2.0; Platelets 268; Potassium 3.7; Sodium 140 09/15/2016: Creatinine, Ser 0.90   Recent Lipid Panel No results found for: CHOL, TRIG, HDL, CHOLHDL, LDLCALC, LDLDIRECT  Physical Exam:    VS:  BP 132/60   Pulse 65   Ht 6\' 6"   (1.981 m)   Wt 280 lb 1.9 oz (127.1 kg)   BMI 32.37 kg/m     Wt Readings from Last 3 Encounters:  12/28/16 280 lb 1.9 oz (127.1 kg)  07/21/16 280 lb (127 kg)  06/22/16 279 lb (126.6 kg)     Physical Exam  Constitutional: He is oriented to person, place, and time. He appears well-developed and well-nourished. No distress.  HENT:  Head: Normocephalic and  atraumatic.  Eyes: No scleral icterus.  Neck: Normal range of motion. No JVD present.  Cardiovascular: Normal rate, regular rhythm, S1 normal and S2 normal.   No murmur heard. Pulmonary/Chest: Effort normal and breath sounds normal. He has no wheezes. He has no rhonchi. He has no rales.  Abdominal: Soft. There is no tenderness.  Musculoskeletal: He exhibits no edema (L leg compression stocking in place).  Neurological: He is alert and oriented to person, place, and time.  Skin: Skin is warm and dry.  Psychiatric: He has a normal mood and affect.    ASSESSMENT:    1. Right-sided heart failure, unspecified HF chronicity ([place])   2. OSA (obstructive sleep apnea)    PLAN:    In order of problems listed above:  1. Right-sided heart failure, unspecified HF chronicity ([place]) - He has leg edema that is controlled by prn Lasix.  He occasionally uses Torsemide for more severe swelling.  He does not take diuretics very much and often controls his edema with compression stockings.  His echo in 05/2016 demonstrated RV dysfunction.  His most recent echo demonstrated normal RV function with mild RV dilation.  I suspect his OSA has helped contribute to some dilation of his RV.  He likely has venous insufficiency as well contributing to his edema.  Overall, his edema is well controlled with prn diuretics.  Continue current management and will plan FU in 1 year.   2. OSA (obstructive sleep apnea) - Continue CPAP.   Dispo:  Return in about 1 year (around 12/28/2017) for Routine Follow Up, w/ Dr. Harrington Challenger, or Richardson Dopp, PA-C.   Medication  Adjustments/Labs and Tests Ordered: Current medicines are reviewed at length with the patient today.  Concerns regarding medicines are outlined above.  Orders/Tests:  No orders of the defined types were placed in this encounter.  Medication changes: No orders of the defined types were placed in this encounter.  Signed, Richardson Dopp, PA-C  12/28/2016 10:09 AM    [facility] [place], [place], [place]  [address] Phone: [phone]; Fax: [phone]

## 2016-12-28 NOTE — Patient Instructions (Signed)
Medication Instructions:  Your physician recommends that you continue on your current medications as directed. Please refer to the Current Medication list given to you today.   Labwork: NONE ORDERED  Testing/Procedures: NONE ORDERED  Follow-Up: Your physician wants you to follow-up in: [place] DR. ROSS You will receive a reminder letter in the mail two months in advance. If you don't receive a letter, please call our office to schedule the follow-up appointment.   Any Other Special Instructions Will Be Listed Below (If Applicable).     If you need a refill on your cardiac medications before your next appointment, please call your pharmacy.

## 2017-02-26 DIAGNOSIS — I509 Heart failure, unspecified: Secondary | ICD-10-CM | POA: Diagnosis not present

## 2017-02-26 DIAGNOSIS — R972 Elevated prostate specific antigen [PSA]: Secondary | ICD-10-CM | POA: Diagnosis not present

## 2017-02-26 DIAGNOSIS — I872 Venous insufficiency (chronic) (peripheral): Secondary | ICD-10-CM | POA: Diagnosis not present

## 2017-02-26 DIAGNOSIS — Z0000 Encounter for general adult medical examination without abnormal findings: Secondary | ICD-10-CM | POA: Diagnosis not present

## 2017-02-26 DIAGNOSIS — E039 Hypothyroidism, unspecified: Secondary | ICD-10-CM | POA: Diagnosis not present

## 2017-02-26 DIAGNOSIS — C439 Malignant melanoma of skin, unspecified: Secondary | ICD-10-CM | POA: Diagnosis not present

## 2017-02-26 DIAGNOSIS — D696 Thrombocytopenia, unspecified: Secondary | ICD-10-CM | POA: Diagnosis not present

## 2017-02-26 DIAGNOSIS — E669 Obesity, unspecified: Secondary | ICD-10-CM | POA: Diagnosis not present

## 2017-02-26 DIAGNOSIS — R7303 Prediabetes: Secondary | ICD-10-CM | POA: Diagnosis not present

## 2017-02-26 DIAGNOSIS — M1990 Unspecified osteoarthritis, unspecified site: Secondary | ICD-10-CM | POA: Diagnosis not present

## 2017-02-26 DIAGNOSIS — G4730 Sleep apnea, unspecified: Secondary | ICD-10-CM | POA: Diagnosis not present

## 2017-02-26 DIAGNOSIS — E7800 Pure hypercholesterolemia, unspecified: Secondary | ICD-10-CM | POA: Diagnosis not present

## 2017-02-26 DIAGNOSIS — N400 Enlarged prostate without lower urinary tract symptoms: Secondary | ICD-10-CM | POA: Diagnosis not present

## 2017-02-26 DIAGNOSIS — Z1389 Encounter for screening for other disorder: Secondary | ICD-10-CM | POA: Diagnosis not present

## 2017-04-29 DIAGNOSIS — Z23 Encounter for immunization: Secondary | ICD-10-CM | POA: Diagnosis not present

## 2017-05-25 DIAGNOSIS — L570 Actinic keratosis: Secondary | ICD-10-CM | POA: Diagnosis not present

## 2017-05-25 DIAGNOSIS — D225 Melanocytic nevi of trunk: Secondary | ICD-10-CM | POA: Diagnosis not present

## 2017-05-25 DIAGNOSIS — Z85820 Personal history of malignant melanoma of skin: Secondary | ICD-10-CM | POA: Diagnosis not present

## 2017-05-25 DIAGNOSIS — L814 Other melanin hyperpigmentation: Secondary | ICD-10-CM | POA: Diagnosis not present

## 2017-05-25 DIAGNOSIS — L821 Other seborrheic keratosis: Secondary | ICD-10-CM | POA: Diagnosis not present

## 2017-05-27 DIAGNOSIS — H25811 Combined forms of age-related cataract, right eye: Secondary | ICD-10-CM | POA: Diagnosis not present

## 2017-05-27 DIAGNOSIS — Z961 Presence of intraocular lens: Secondary | ICD-10-CM | POA: Diagnosis not present

## 2017-05-27 DIAGNOSIS — H524 Presbyopia: Secondary | ICD-10-CM | POA: Diagnosis not present

## 2017-07-22 DIAGNOSIS — H524 Presbyopia: Secondary | ICD-10-CM | POA: Diagnosis not present

## 2017-07-22 DIAGNOSIS — H25811 Combined forms of age-related cataract, right eye: Secondary | ICD-10-CM | POA: Diagnosis not present

## 2017-08-16 DIAGNOSIS — H2511 Age-related nuclear cataract, right eye: Secondary | ICD-10-CM | POA: Diagnosis not present

## 2017-08-30 DIAGNOSIS — H2511 Age-related nuclear cataract, right eye: Secondary | ICD-10-CM | POA: Diagnosis not present

## 2017-08-30 DIAGNOSIS — H25811 Combined forms of age-related cataract, right eye: Secondary | ICD-10-CM | POA: Diagnosis not present

## 2017-08-31 DIAGNOSIS — E039 Hypothyroidism, unspecified: Secondary | ICD-10-CM | POA: Diagnosis not present

## 2017-08-31 DIAGNOSIS — Z1389 Encounter for screening for other disorder: Secondary | ICD-10-CM | POA: Diagnosis not present

## 2017-08-31 DIAGNOSIS — E7800 Pure hypercholesterolemia, unspecified: Secondary | ICD-10-CM | POA: Diagnosis not present

## 2017-08-31 DIAGNOSIS — C439 Malignant melanoma of skin, unspecified: Secondary | ICD-10-CM | POA: Diagnosis not present

## 2017-08-31 DIAGNOSIS — D696 Thrombocytopenia, unspecified: Secondary | ICD-10-CM | POA: Diagnosis not present

## 2017-08-31 DIAGNOSIS — I5030 Unspecified diastolic (congestive) heart failure: Secondary | ICD-10-CM | POA: Diagnosis not present

## 2017-08-31 DIAGNOSIS — M1990 Unspecified osteoarthritis, unspecified site: Secondary | ICD-10-CM | POA: Diagnosis not present

## 2017-08-31 DIAGNOSIS — R7303 Prediabetes: Secondary | ICD-10-CM | POA: Diagnosis not present

## 2017-08-31 DIAGNOSIS — E669 Obesity, unspecified: Secondary | ICD-10-CM | POA: Diagnosis not present

## 2017-08-31 DIAGNOSIS — R972 Elevated prostate specific antigen [PSA]: Secondary | ICD-10-CM | POA: Diagnosis not present

## 2017-08-31 DIAGNOSIS — R739 Hyperglycemia, unspecified: Secondary | ICD-10-CM | POA: Diagnosis not present

## 2017-09-23 DIAGNOSIS — R972 Elevated prostate specific antigen [PSA]: Secondary | ICD-10-CM | POA: Diagnosis not present

## 2017-09-24 DIAGNOSIS — L97528 Non-pressure chronic ulcer of other part of left foot with other specified severity: Secondary | ICD-10-CM | POA: Diagnosis not present

## 2017-09-24 DIAGNOSIS — E11621 Type 2 diabetes mellitus with foot ulcer: Secondary | ICD-10-CM | POA: Diagnosis not present

## 2017-10-08 DIAGNOSIS — R972 Elevated prostate specific antigen [PSA]: Secondary | ICD-10-CM | POA: Diagnosis not present

## 2017-10-27 DIAGNOSIS — E1140 Type 2 diabetes mellitus with diabetic neuropathy, unspecified: Secondary | ICD-10-CM | POA: Diagnosis not present

## 2017-11-22 DIAGNOSIS — D1801 Hemangioma of skin and subcutaneous tissue: Secondary | ICD-10-CM | POA: Diagnosis not present

## 2017-11-22 DIAGNOSIS — Z85820 Personal history of malignant melanoma of skin: Secondary | ICD-10-CM | POA: Diagnosis not present

## 2017-11-22 DIAGNOSIS — L570 Actinic keratosis: Secondary | ICD-10-CM | POA: Diagnosis not present

## 2017-11-22 DIAGNOSIS — L814 Other melanin hyperpigmentation: Secondary | ICD-10-CM | POA: Diagnosis not present

## 2017-11-22 DIAGNOSIS — L821 Other seborrheic keratosis: Secondary | ICD-10-CM | POA: Diagnosis not present

## 2018-01-20 ENCOUNTER — Ambulatory Visit: Payer: Medicare Other | Admitting: Internal Medicine

## 2018-01-31 ENCOUNTER — Telehealth (HOSPITAL_COMMUNITY): Payer: Self-pay | Admitting: *Deleted

## 2018-01-31 ENCOUNTER — Ambulatory Visit (INDEPENDENT_AMBULATORY_CARE_PROVIDER_SITE_OTHER): Payer: Medicare Other | Admitting: Physician Assistant

## 2018-01-31 ENCOUNTER — Encounter: Payer: Self-pay | Admitting: Physician Assistant

## 2018-01-31 ENCOUNTER — Encounter: Payer: Self-pay | Admitting: *Deleted

## 2018-01-31 ENCOUNTER — Encounter (INDEPENDENT_AMBULATORY_CARE_PROVIDER_SITE_OTHER): Payer: Self-pay

## 2018-01-31 VITALS — BP 132/76 | HR 70 | Ht 78.0 in | Wt 288.0 lb

## 2018-01-31 DIAGNOSIS — R0602 Shortness of breath: Secondary | ICD-10-CM

## 2018-01-31 DIAGNOSIS — G4733 Obstructive sleep apnea (adult) (pediatric): Secondary | ICD-10-CM

## 2018-01-31 DIAGNOSIS — I50812 Chronic right heart failure: Secondary | ICD-10-CM | POA: Diagnosis not present

## 2018-01-31 NOTE — Progress Notes (Signed)
Cardiology Office Note:    Date:  01/31/2018   ID:  Matthew Robinson, DOB 01-09-1946, MRN [id number]  PCP:  Wenda Low, MD  Cardiologist:  Dorris Carnes, MD / Richardson Dopp, PA-C   Referring MD: Wenda Low, MD   Chief Complaint  Patient presents with  . Follow-up    R sided Heart Failure    History of Present Illness:    Matthew Robinson is a 72 y.o. male with mitral valve prolapse, sleep apnea, hyperlipidemia.  He was admitted 05/2016 with LE cellulitis associated with strep bacteremia. He developed volume excess and required IV Lasix. Echo showed normal LVEF and mildly reduced RVSF. He was seen in our office in December 2017.  A follow-up echocardiogram in June 2018 demonstrated normal LV function, mild diastolic dysfunction normal RV systolic function.  He was last seen in June 2018.  [name] returns for follow-up.  He is here alone.  His primary care doctor recently placed him on metformin.  His A1c in February was 6.5.  He denies chest discomfort.  He has noted dyspnea with exertion over the last several months.  [facility]  Walking up the incline into the door, he notices that he is short of breath.  He denies orthopnea or PND.  He sleeps with CPAP.  He denies syncope.  Prior CV studies:   The following studies were reviewed today:  Echo 12/24/16 - Left ventricle: The cavity size was normal. Wall thickness was normal. Systolic function was normal. The estimated ejection fraction was in the range of 60% to 65%. Doppler parameters are consistent with abnormal left ventricular relaxation (grade 1 diastolic dysfunction). - Aortic valve: There was trivial regurgitation. - Right ventricle: The cavity size was mildly dilated. - Right atrium: The atrium was mildly dilated.  Echo 06/02/16 EF 55 and 60, normal wall motion, trivial AI, moderate RVE, mildly reduced RVSF, moderate to severe RAE  Past Medical History:  Diagnosis Date    . Chronic right-sided CHF (congestive heart failure) ([place])    Echo 11/17: EF 55 and 60, normal wall motion, trivial AI, moderate RVE, mildly reduced RVSF, moderate to severe RAE // Echo 6/18: EF 60-65, Gr 1 DD, trivial AI, mild RVE, mild RAE.  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 Glucose intolerance (impaired glucose tolerance)   . Hyperlipemia    takes Simvastatin ~ every other day  . Melanoma of skin ([place])    left shoulder  . Mitral valve prolapse    hx of - seen years ago by Dr Enid Derry in [place], [place]  . Sleep apnea    cpap 16   Surgical Hx: The patient  has a past surgical history that includes Melanoma excision; Retinal detachment surgery; Colonoscopy; Meniscus repair (Left, 1970); Spine surgery; Cataract extraction (Left); Tonsillectomy; and Colonoscopy with propofol (N/A, 08/14/2014).   Current Medications: Current Meds  Medication Sig  . aspirin EC 81 MG tablet Take 81 mg by mouth 2 (two) times a week.   . Cholecalciferol (VITAMIN D) 2000 UNITS tablet Take 2,000 Units by mouth every other day.   . famotidine (PEPCID) 20 MG tablet Take 20 mg by mouth daily as needed for heartburn.   . furosemide (LASIX) 40 MG tablet Take 40 mg by mouth daily as needed for edema.  Marland Kitchen ibuprofen (ADVIL,MOTRIN) 200 MG tablet Take 600 mg by mouth  every morning.  Marland Kitchen levothyroxine (SYNTHROID, LEVOTHROID) 50 MCG tablet Take 50 mcg by mouth every other day. Alternates with 75 mcg strength  . levothyroxine (SYNTHROID, LEVOTHROID) 75 MCG tablet Take 75 mcg by mouth every other day. Alternates with 50 mcg strength  . metFORMIN (GLUCOPHAGE) 500 MG tablet Take 1 tablet by mouth at bedtime.  . Multiple Vitamin (MULTIVITAMIN WITH MINERALS) TABS Take 1 tablet by mouth 2 (two) times a week.   . Omega-3 Fatty Acids (FISH OIL) 1200 MG CAPS Take 1,200 mg by mouth 2 (two) times a week.   . potassium chloride 20 MEQ TBCR Take 1 tab daily when taking torsemide  .  simvastatin (ZOCOR) 20 MG tablet Take 20 mg by mouth 3 (three) times a week.   . torsemide (DEMADEX) 20 MG tablet Take 1 tablet (20 mg total) by mouth daily as needed (edema).     Allergies:   Patient has no known allergies.   Social History   Tobacco Use  . Smoking status: Never Smoker  . Smokeless tobacco: Never Used  Substance Use Topics  . Alcohol use: Yes    Alcohol/week: 0.6 - 1.8 oz    Types: 1 - 3 Glasses of wine per week    Comment: x2 weekly   . Drug use: No     Family Hx: The patient's family history includes Atrial fibrillation in his mother; Cancer in his father; Heart attack in his father; Heart disease in his mother; Heart failure in his mother; Hypertension in his father; Mitral valve prolapse in his mother.  ROS:   Please see the history of present illness.    Review of Systems  Constitution: Positive for malaise/fatigue.  Musculoskeletal: Positive for back pain.  Neurological: Positive for loss of balance.   All other systems reviewed and are negative.   EKGs/Labs/Other Test Reviewed:    EKG:  EKG is  ordered today.  The ekg ordered today demonstrates normal sinus rhythm, heart rate 70 normal axis, QTC 423, similar to prior tracing dated 12/28/2016  Recent Labs: No results found for requested labs within last 8760 hours.   From [facility] Tool: Cholesterol, total  188.000  02/26/2017 HDL    47.000  02/26/2017 LDL    116.000  02/26/2017 Triglycerides   126.000  02/26/2017 A1C    6.500   08/31/2017 Hemoglobin   16.000  08/31/2017 Creatinine, Serum  0.970   02/26/2017 Potassium   4.800   02/26/2017 ALT (SGPT)   40.000  02/26/2017 TSH    3.930   08/31/2017  Recent Lipid Panel No results found for: CHOL, TRIG, HDL, CHOLHDL, LDLCALC, LDLDIRECT  Physical Exam:    VS:  BP 132/76   Pulse 70   Ht 6\' 6"  (1.981 m)   Wt 288 lb (130.6 kg)   SpO2 92%   BMI 33.28 kg/m     Wt Readings from Last 3 Encounters:  01/31/18 288 lb (130.6 kg)  12/28/16 280 lb 1.9 oz (127.1  kg)  07/21/16 280 lb (127 kg)     Physical Exam  Constitutional: He is oriented to person, place, and time. He appears well-developed and well-nourished. No distress.  HENT:  Head: Normocephalic and atraumatic.  Eyes: No scleral icterus.  Neck: Neck supple. No JVD present.  Cardiovascular: Normal rate, regular rhythm, S1 normal and S2 normal.  No murmur heard. Pulmonary/Chest: Breath sounds normal. He has no rales.  Abdominal: Soft. There is no hepatomegaly.  Musculoskeletal: He exhibits edema (trace bilat ankle edema).  Neurological: He is alert and oriented to person, place, and time.  Skin: Skin is warm and dry.  Psychiatric: He has a normal mood and affect.    ASSESSMENT & PLAN:    Shortness of breath Etiology not entirely clear.  He has noted dyspnea with activity over the last several months.  He does have a new diagnosis of diabetes which puts him at high risk for coronary disease.  He also had RV dysfunction by echocardiogram in November 2017.  Follow-up echo demonstrated normal RV function.  He does have occasional lower extremity swelling, for which he takes Lasix about every 3 to 4 days.  -Arrange a Lexiscan Myoview to rule out ischemic heart disease  -Arrange follow-up echo to reassess LV and RV function  Chronic right-sided CHF (congestive heart failure) ([place]) As noted, a follow-up echo demonstrated improved RV function.  I will obtain a follow-up echo given his recent symptoms of shortness of breath.  He continues to take Lasix every 3 to 4 days as needed for lower extremity swelling.  OSA (obstructive sleep apnea) He remains on CPAP.   Dispo:  Return in about 6 months (around 08/03/2018) for Routine Follow Up, w/ Richardson Dopp, PA-C.   Medication Adjustments/Labs and Tests Ordered: Current medicines are reviewed at length with the patient today.  Concerns regarding medicines are outlined above.  Tests Ordered: Orders Placed This Encounter  Procedures  .  Myocardial Perfusion Imaging  . EKG 12-Lead  . ECHOCARDIOGRAM COMPLETE   Medication Changes: No orders of the defined types were placed in this encounter.   Signed, Richardson Dopp, PA-C  01/31/2018 10:15 AM    [facility] [place], [place], [place]  [address] Phone: [phone]; Fax: [phone]

## 2018-01-31 NOTE — Telephone Encounter (Signed)
Patient called for detailed instructions per Myocardial Perfusion Study Information Sheet for the test on 02/07/18 at 7:15. Patient notified to arrive 15 minutes early and that it is imperative to arrive on time for appointment to keep from having the test rescheduled.  If you need to cancel or reschedule your appointment, please call the office within 24 hours of your appointment. . Patient verbalized understanding.Matthew Robinson

## 2018-01-31 NOTE — Patient Instructions (Signed)
Medication Instructions:  1. Your physician recommends that you continue on your current medications as directed. Please refer to the Current Medication list given to you today.    Labwork: NONE ORDERED TODAY  Testing/Procedures: 1. Your physician has requested that you have an echocardiogram. Echocardiography is a painless test that uses sound waves to create images of your heart. It provides your doctor with information about the size and shape of your heart and how well your heart's chambers and valves are working. This procedure takes approximately one hour. There are no restrictions for this procedure.  2. Your physician has requested that you have a lexiscan myoview. For further information please visit [email or web address].[email or web address]. Please follow instruction sheet, as given.    Follow-Up: 6 MONTH FOLLOW UP WITH [facility]   Any Other Special Instructions Will Be Listed Below (If Applicable).     If you need a refill on your cardiac medications before your next appointment, please call your pharmacy.

## 2018-02-02 ENCOUNTER — Telehealth (HOSPITAL_COMMUNITY): Payer: Self-pay | Admitting: *Deleted

## 2018-02-02 NOTE — Telephone Encounter (Signed)
Left message on voicemail per DPR in reference to upcoming appointment scheduled on 02/07/18 with detailed instructions given per Myocardial Perfusion Study Information Sheet for the test. LM to arrive 15 minutes early, and that it is imperative to arrive on time for appointment to keep from having the test rescheduled. If you need to cancel or reschedule your appointment, please call the office within 24 hours of your appointment. Failure to do so may result in a cancellation of your appointment, and a $50 no show fee. Phone number given for call back for any questions. Maxwell Lemen Jacqueline    

## 2018-02-07 ENCOUNTER — Ambulatory Visit (HOSPITAL_COMMUNITY): Payer: Medicare Other | Attending: Internal Medicine

## 2018-02-07 ENCOUNTER — Other Ambulatory Visit: Payer: Self-pay

## 2018-02-07 ENCOUNTER — Ambulatory Visit (HOSPITAL_BASED_OUTPATIENT_CLINIC_OR_DEPARTMENT_OTHER): Payer: Medicare Other

## 2018-02-07 DIAGNOSIS — I083 Combined rheumatic disorders of mitral, aortic and tricuspid valves: Secondary | ICD-10-CM | POA: Insufficient documentation

## 2018-02-07 DIAGNOSIS — R0602 Shortness of breath: Secondary | ICD-10-CM

## 2018-02-07 DIAGNOSIS — E785 Hyperlipidemia, unspecified: Secondary | ICD-10-CM | POA: Insufficient documentation

## 2018-02-07 DIAGNOSIS — I50810 Right heart failure, unspecified: Secondary | ICD-10-CM | POA: Diagnosis not present

## 2018-02-07 DIAGNOSIS — G4730 Sleep apnea, unspecified: Secondary | ICD-10-CM | POA: Diagnosis not present

## 2018-02-07 LAB — MYOCARDIAL PERFUSION IMAGING
CHL CUP NUCLEAR SSS: 10
LV sys vol: 49 mL
LVDIAVOL: 103 mL (ref 62–150)
NUC STRESS TID: 0.98
Peak HR: 70 {beats}/min
RATE: 0.33
Rest HR: 56 {beats}/min
SDS: 2
SRS: 8

## 2018-02-07 MED ORDER — TECHNETIUM TC 99M TETROFOSMIN IV KIT
31.5000 | PACK | Freq: Once | INTRAVENOUS | Status: AC | PRN
  Administered 2018-02-07: 31.5 via INTRAVENOUS
  Filled 2018-02-07: qty 32

## 2018-02-07 MED ORDER — TECHNETIUM TC 99M TETROFOSMIN IV KIT
10.3000 | PACK | Freq: Once | INTRAVENOUS | Status: AC | PRN
  Administered 2018-02-07: 10.3 via INTRAVENOUS
  Filled 2018-02-07: qty 11

## 2018-02-07 MED ORDER — REGADENOSON 0.4 MG/5ML IV SOLN
0.4000 mg | Freq: Once | INTRAVENOUS | Status: AC
  Administered 2018-02-07: 0.4 mg via INTRAVENOUS

## 2018-02-10 ENCOUNTER — Telehealth: Payer: Self-pay | Admitting: Physician Assistant

## 2018-02-10 NOTE — Telephone Encounter (Signed)
Left message at home phone number for patient to call back.  I need to review his echocardiogram findings.  Dr. Harrington Challenger has suggested proceeding with a R heart catheterization.  I need to discuss this with him and arrange it. Richardson Dopp, PA-C    02/10/2018 9:47 AM

## 2018-02-10 NOTE — Telephone Encounter (Signed)
Per Brynda Rim. PA/Dr. Harrington Challenger pt to have appt in 04/2018 to discuss Right HR Cath. Pt has been scheduled to see Dr. Harrington Challenger 04/15/18 @ 3 pm. Pt thanked me for the call.

## 2018-02-10 NOTE — Telephone Encounter (Signed)
I spoke with the patient and reviewed the echocardiogram findings and discussed recommendation from Dr. Harrington Challenger to arrange a R heart cath.    He is going on a couple of trips [place]) and will not return until September.  Please arrange a follow up appointment in October (10/3, 10/4 or 10/7 are several days that are good for him) with either Dr. Harrington Challenger or me on a day Dr. Harrington Challenger is in the office.  Please call him with the appt day and time.  I will route this note to Dr. Harrington Challenger as an [place] as well. Richardson Dopp, PA-C    02/10/2018 4:56 PM

## 2018-02-10 NOTE — Telephone Encounter (Signed)
-----   Message from Dorris Carnes V, MD sent at 02/08/2018 11:13 AM EDT ----- R side of the heart is mildly dilated   Difficult study   RVEF may be mildly down LVEF is normal I would recomm R heart cath to define pressres

## 2018-03-16 DIAGNOSIS — G4730 Sleep apnea, unspecified: Secondary | ICD-10-CM | POA: Diagnosis not present

## 2018-03-16 DIAGNOSIS — E7800 Pure hypercholesterolemia, unspecified: Secondary | ICD-10-CM | POA: Diagnosis not present

## 2018-03-16 DIAGNOSIS — R972 Elevated prostate specific antigen [PSA]: Secondary | ICD-10-CM | POA: Diagnosis not present

## 2018-03-16 DIAGNOSIS — E1140 Type 2 diabetes mellitus with diabetic neuropathy, unspecified: Secondary | ICD-10-CM | POA: Diagnosis not present

## 2018-03-16 DIAGNOSIS — C439 Malignant melanoma of skin, unspecified: Secondary | ICD-10-CM | POA: Diagnosis not present

## 2018-03-16 DIAGNOSIS — M1990 Unspecified osteoarthritis, unspecified site: Secondary | ICD-10-CM | POA: Diagnosis not present

## 2018-03-16 DIAGNOSIS — N400 Enlarged prostate without lower urinary tract symptoms: Secondary | ICD-10-CM | POA: Diagnosis not present

## 2018-03-16 DIAGNOSIS — E039 Hypothyroidism, unspecified: Secondary | ICD-10-CM | POA: Diagnosis not present

## 2018-03-16 DIAGNOSIS — Z23 Encounter for immunization: Secondary | ICD-10-CM | POA: Diagnosis not present

## 2018-03-16 DIAGNOSIS — Z1389 Encounter for screening for other disorder: Secondary | ICD-10-CM | POA: Diagnosis not present

## 2018-03-16 DIAGNOSIS — I5030 Unspecified diastolic (congestive) heart failure: Secondary | ICD-10-CM | POA: Diagnosis not present

## 2018-03-16 DIAGNOSIS — Z0000 Encounter for general adult medical examination without abnormal findings: Secondary | ICD-10-CM | POA: Diagnosis not present

## 2018-03-16 DIAGNOSIS — D696 Thrombocytopenia, unspecified: Secondary | ICD-10-CM | POA: Diagnosis not present

## 2018-03-21 DIAGNOSIS — G4733 Obstructive sleep apnea (adult) (pediatric): Secondary | ICD-10-CM | POA: Diagnosis not present

## 2018-04-15 ENCOUNTER — Encounter: Payer: Self-pay | Admitting: *Deleted

## 2018-04-15 ENCOUNTER — Ambulatory Visit (INDEPENDENT_AMBULATORY_CARE_PROVIDER_SITE_OTHER): Payer: Medicare Other | Admitting: Internal Medicine

## 2018-04-15 ENCOUNTER — Encounter: Payer: Self-pay | Admitting: Internal Medicine

## 2018-04-15 VITALS — BP 146/72 | HR 69 | Ht 78.0 in | Wt 287.6 lb

## 2018-04-15 DIAGNOSIS — R0602 Shortness of breath: Secondary | ICD-10-CM | POA: Diagnosis not present

## 2018-04-15 DIAGNOSIS — I50812 Chronic right heart failure: Secondary | ICD-10-CM | POA: Diagnosis not present

## 2018-04-15 NOTE — Patient Instructions (Signed)
Medication Instructions:  Your physician recommends that you continue on your current medications as directed. Please refer to the Current Medication list given to you today. If you need a refill on your cardiac medications before your next appointment, please call your pharmacy.   Lab work: none If you have labs (blood work) drawn today and your tests are completely normal, you will receive your results only by: Marland Kitchen MyChart Message (if you have MyChart) OR . A paper copy in the mail If you have any lab test that is abnormal or we need to change your treatment, we will call you to review the results.  Testing/Procedures: Your physician has requested that you have a cardiac MRI. Cardiac MRI uses a computer to create images of your heart as its beating, producing both still and moving pictures of your heart and major blood vessels. For further information please visit [email or web address] Please follow the instruction sheet given to you today for more information.   Follow-Up: At [facility], you and your health needs are our priority.  As part of our continuing mission to provide you with exceptional heart care, we have created designated Provider Care Teams.  These Care Teams include your primary Cardiologist (physician) and Advanced Practice Providers (APPs -  Physician Assistants and Nurse Practitioners) who all work together to provide you with the care you need, when you need it.  Follow up with your physician will depend on test results.  Any Other Special Instructions Will Be Listed Below (If Applicable).

## 2018-04-15 NOTE — Progress Notes (Addendum)
Cardiology Office Note:    Date:  04/15/2018   ID:  Matthew Robinson, DOB 01/25/1946, MRN [id number]  PCP:  Wenda Low, MD  Cardiologist:  Dorris Carnes, MD / Dorris Carnes, MD   Referring MD: Wenda Low, MD   Pt returns for evaluation of SOB and review of echo    History of Present Illness:    Matthew Robinson is a 72 y.o. male with mitral valve prolapse, sleep apnea, hyperlipidemia.  He was admitted 05/2016 with LE cellulitis associated with strep bacteremia. He developed volume excess and required IV Lasix. Echo showed normal LVEF and mildly reduced RVSF. He was seen in our office in December 2017.  A follow-up echocardiogram in June 2018 demonstrated normal LV function, mild diastolic dysfunction normal RV systolic function.  He was last seen in June 2018.  He was seen in cardiology in July by Kathleen Argue   Complained of some DOE   Mostly with inclinies    No CP  The patient went on to have a myovue scan  On February 07 2018   This showed no ischemia    He was also set up for an echo Done the same day    This showed normal LV function  RV was dilated and function appeared mildly down     Based on test results I reomm R heart cath to evaluated pulmonary pressuress  Since seen in clinic the pt continues to deny CP    Breathing is stable   Gets winded with inclines/stairs    Denies palpitations    Prior CV studies:   The following studies were reviewed today:  Echo 12/24/16 - Left ventricle: The cavity size was normal. Wall thickness was normal. Systolic function was normal. The estimated ejection fraction was in the range of 60% to 65%. Doppler parameters are consistent with abnormal left ventricular relaxation (grade 1 diastolic dysfunction). - Aortic valve: There was trivial regurgitation. - Right ventricle: The cavity size was mildly dilated. - Right atrium: The atrium was mildly dilated.  Echo 06/02/16 EF 55 and 60, normal wall motion, trivial AI, moderate RVE, mildly  reduced RVSF, moderate to severe RAE  Past Medical History:  Diagnosis Date  . Chronic right-sided CHF (congestive heart failure) ([place])    Echo 11/17: EF 55 and 60, normal wall motion, trivial AI, moderate RVE, mildly reduced RVSF, moderate to severe RAE // Echo 6/18: EF 60-65, Gr 1 DD, trivial AI, mild RVE, mild RAE.  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 Glucose intolerance (impaired glucose tolerance)   . Hyperlipemia    takes Simvastatin ~ every other day  . Melanoma of skin ([place])    left shoulder  . Mitral valve prolapse    hx of - seen years ago by Dr Enid Derry in [place], [place]  . Sleep apnea    cpap 16   Surgical Hx: The patient  has a past surgical history that includes Melanoma excision; Retinal detachment surgery; Colonoscopy; Meniscus repair (Left, 1970); Spine surgery; Cataract extraction (Left); Tonsillectomy; and Colonoscopy with propofol (N/A, 08/14/2014).   Current Medications: Current Meds  Medication Sig  . aspirin EC 81 MG tablet Take 81 mg by mouth 2 (two) times a week.   . Cholecalciferol (VITAMIN D) 2000 UNITS tablet Take 2,000 Units by mouth every other day.   . famotidine (PEPCID) 20 MG tablet Take 20 mg by mouth daily as needed  for heartburn.   . furosemide (LASIX) 40 MG tablet Take 40 mg by mouth daily as needed for edema.  Marland Kitchen ibuprofen (ADVIL,MOTRIN) 200 MG tablet Take 600 mg by mouth every morning.  Marland Kitchen levothyroxine (SYNTHROID, LEVOTHROID) 50 MCG tablet Take 50 mcg by mouth every other day. Alternates with 75 mcg strength  . levothyroxine (SYNTHROID, LEVOTHROID) 75 MCG tablet Take 75 mcg by mouth every other day. Alternates with 50 mcg strength  . metFORMIN (GLUCOPHAGE) 500 MG tablet Take 1 tablet by mouth at bedtime.  . Multiple Vitamin (MULTIVITAMIN WITH MINERALS) TABS Take 1 tablet by mouth 2 (two) times a week.   . Omega-3 Fatty Acids (FISH OIL) 1200 MG CAPS Take 1,200 mg by mouth 2 (two) times a week.   .  potassium chloride 20 MEQ TBCR Take 1 tab daily when taking torsemide  . simvastatin (ZOCOR) 20 MG tablet Take 20 mg by mouth 3 (three) times a week.      Allergies:   Patient has no known allergies.   Social History   Tobacco Use  . Smoking status: Never Smoker  . Smokeless tobacco: Never Used  Substance Use Topics  . Alcohol use: Yes    Alcohol/week: 1.0 - 3.0 standard drinks    Types: 1 - 3 Glasses of wine per week    Comment: x2 weekly   . Drug use: No     Family Hx: The patient's family history includes Atrial fibrillation in his mother; Cancer in his father; Heart attack in his father; Heart disease in his mother; Heart failure in his mother; Hypertension in his father; Mitral valve prolapse in his mother.  ROS:   All systoems reviewed   Neg to above problem except as noted above    EKGs/Labs/Other Test Reviewed:    EKG:  EKG is  Not oredered today  Recent Labs: No results found for requested labs within last 8760 hours.   From [facility] Tool: Cholesterol, total  188.000  02/26/2017 HDL    47.000  02/26/2017 LDL    116.000  02/26/2017 Triglycerides   126.000  02/26/2017 A1C    6.500   08/31/2017 Hemoglobin   16.000  08/31/2017 Creatinine, Serum  0.970   02/26/2017 Potassium   4.800   02/26/2017 ALT (SGPT)   40.000  02/26/2017 TSH    3.930   08/31/2017  Recent Lipid Panel No results found for: CHOL, TRIG, HDL, CHOLHDL, LDLCALC, LDLDIRECT  Physical Exam:    VS:  BP (!) 146/72   Pulse 69   Ht 6\' 6"  (1.981 m)   Wt 287 lb 9.6 oz (130.5 kg)   SpO2 93%   BMI 33.24 kg/m     Wt Readings from Last 3 Encounters:  04/15/18 287 lb 9.6 oz (130.5 kg)  02/07/18 288 lb (130.6 kg)  01/31/18 288 lb (130.6 kg)     PE   Pt is in NAD    BP 146/72    P69  Sat 93% RA   Wt 287 lb  Height 6'6 HEENT   NCAT Neck:   JVP is normal  No bruits Lungs are CTA    Cardiac RRR   No S3   No signif murmurs Abd is supple   Nontender    Ext with Tr LE edema  ASSESSMENT & PLAN:    Shortness of  breath I have reviewed  Test results with pt     Review of echoes from the past   Most show RV to be dilated  Most recent study it appears more prominent  Concern for pulmonary HTN   Discussed R hart cath procedure/rationale for doing    The patient is worried   Not eager to proceed   Will sched a cardiac MRI to confirm RV size and function   Furhter work up based on these results    OSA (obstructive sleep apnea) Continue with CPAP  Hx MVP   Echo without signif prolapse or MR  Hyperlpidemia  Will need to follow  Obesity   Needs to lose wt.  F/U based on MRI results   Medication Adjustments/Labs and Tests Ordered: Current medicines are reviewed at length with the patient today.  Concerns regarding medicines are outlined above.  Tests Ordered: No orders of the defined types were placed in this encounter.  Medication Changes: No orders of the defined types were placed in this encounter.   Signed, Dorris Carnes, MD  04/15/2018 3:38 PM    [address], [place], [place]  [address] Phone: [phone]; Fax: [phone]

## 2018-04-20 ENCOUNTER — Encounter: Payer: Self-pay | Admitting: Physician Assistant

## 2018-04-28 DIAGNOSIS — E119 Type 2 diabetes mellitus without complications: Secondary | ICD-10-CM | POA: Diagnosis not present

## 2018-04-28 DIAGNOSIS — H353131 Nonexudative age-related macular degeneration, bilateral, early dry stage: Secondary | ICD-10-CM | POA: Diagnosis not present

## 2018-04-28 DIAGNOSIS — Z961 Presence of intraocular lens: Secondary | ICD-10-CM | POA: Diagnosis not present

## 2018-04-28 DIAGNOSIS — H26491 Other secondary cataract, right eye: Secondary | ICD-10-CM | POA: Diagnosis not present

## 2018-05-02 ENCOUNTER — Ambulatory Visit (HOSPITAL_COMMUNITY)
Admission: RE | Admit: 2018-05-02 | Discharge: 2018-05-02 | Disposition: A | Discharge status: Home / Self Care | Payer: Medicare Other | Source: Ambulatory Visit | Attending: Internal Medicine | Admitting: Internal Medicine

## 2018-05-02 DIAGNOSIS — R0602 Shortness of breath: Secondary | ICD-10-CM | POA: Diagnosis not present

## 2018-05-02 DIAGNOSIS — Z23 Encounter for immunization: Secondary | ICD-10-CM | POA: Diagnosis not present

## 2018-05-02 DIAGNOSIS — I50812 Chronic right heart failure: Secondary | ICD-10-CM | POA: Diagnosis not present

## 2018-05-02 LAB — CREATININE, SERUM
CREATININE: 0.89 mg/dL (ref 0.61–1.24)
GFR calc non Af Amer: >60 mL/min (ref >60)

## 2018-05-02 MED ORDER — GADOBUTROL 1 MMOL/ML IV SOLN
14.0000 mL | Freq: Once | INTRAVENOUS | Status: AC | PRN
  Administered 2018-05-02: 14 mL via INTRAVENOUS

## 2018-05-02 MED ORDER — GADOBENATE DIMEGLUMINE 529 MG/ML IV SOLN: 14.0000 mL | Freq: Once | INTRAVENOUS | Status: DC | PRN

## 2018-05-23 ENCOUNTER — Telehealth: Payer: Self-pay | Admitting: Internal Medicine

## 2018-05-23 NOTE — Telephone Encounter (Signed)
New message:       Pt is calling and states he is supposed to be having a procedure at the hosp on 05/26/18 but he states he hasn't been called or given any type of instructions for this procedure. Please advise

## 2018-05-24 ENCOUNTER — Telehealth (HOSPITAL_COMMUNITY): Payer: Self-pay | Admitting: *Deleted

## 2018-05-24 NOTE — Telephone Encounter (Signed)
Per Dr Harrington Challenger, pt needs RHC w/Dr Aundra Dubin to eval DOE and possible pulm htn.  Have attempted to call pt to schedule and Left message to call back    Dr Aundra Dubin is available to do [place] on 11/13, 11/14 PM, 11/21 or 11/22 AM.  Will await pt's call

## 2018-05-24 NOTE — Telephone Encounter (Signed)
Per Dr. Harrington Challenger she is arranging this with [facility] MD/nurse.

## 2018-05-25 NOTE — Telephone Encounter (Signed)
Patient is scheduled for RHC tomorrow 05/26/18 per [place].  She states patient was called and is aware of instructions.

## 2018-05-26 ENCOUNTER — Encounter (HOSPITAL_COMMUNITY)
Admission: RE | Disposition: A | Discharge status: Home / Self Care | Payer: Self-pay | Source: Ambulatory Visit | Attending: Cardiology

## 2018-05-26 ENCOUNTER — Ambulatory Visit (HOSPITAL_COMMUNITY)
Admission: RE | Admit: 2018-05-26 | Discharge: 2018-05-26 | Disposition: A | Discharge status: Home / Self Care | Payer: Medicare Other | Source: Ambulatory Visit | Attending: Cardiology | Admitting: Cardiology

## 2018-05-26 ENCOUNTER — Other Ambulatory Visit: Payer: Self-pay

## 2018-05-26 DIAGNOSIS — Z7984 Long term (current) use of oral hypoglycemic drugs: Secondary | ICD-10-CM | POA: Diagnosis not present

## 2018-05-26 DIAGNOSIS — I509 Heart failure, unspecified: Secondary | ICD-10-CM | POA: Diagnosis not present

## 2018-05-26 DIAGNOSIS — Z8249 Family history of ischemic heart disease and other diseases of the circulatory system: Secondary | ICD-10-CM | POA: Insufficient documentation

## 2018-05-26 DIAGNOSIS — I272 Pulmonary hypertension, unspecified: Secondary | ICD-10-CM | POA: Diagnosis not present

## 2018-05-26 DIAGNOSIS — E785 Hyperlipidemia, unspecified: Secondary | ICD-10-CM | POA: Insufficient documentation

## 2018-05-26 DIAGNOSIS — G4733 Obstructive sleep apnea (adult) (pediatric): Secondary | ICD-10-CM | POA: Diagnosis not present

## 2018-05-26 DIAGNOSIS — R7302 Impaired glucose tolerance (oral): Secondary | ICD-10-CM | POA: Diagnosis not present

## 2018-05-26 DIAGNOSIS — I341 Nonrheumatic mitral (valve) prolapse: Secondary | ICD-10-CM | POA: Diagnosis not present

## 2018-05-26 HISTORY — PX: RIGHT HEART CATH: CATH118263

## 2018-05-26 LAB — POCT I-STAT 3, VENOUS BLOOD GAS (G3P V)
ACID-BASE DEFICIT: 1 mmol/L (ref 0.0–2.0)
ACID-BASE DEFICIT: 1 mmol/L (ref 0.0–2.0)
ACID-BASE DEFICIT: 2 mmol/L (ref 0.0–2.0)
Acid-base deficit: 1 mmol/L (ref 0.0–2.0)
Acid-base deficit: 1 mmol/L (ref 0.0–2.0)
BICARBONATE: 23.1 mmol/L (ref 20.0–28.0)
BICARBONATE: 23.2 mmol/L (ref 20.0–28.0)
Bicarbonate: 19.3 mmol/L — ABNORMAL LOW (ref 20.0–28.0)
Bicarbonate: 22.9 mmol/L (ref 20.0–28.0)
Bicarbonate: 23.4 mmol/L (ref 20.0–28.0)
O2 SAT: 82 %
O2 SAT: 84 %
O2 SAT: 97 %
O2 Saturation: 82 %
O2 Saturation: 84 %
PCO2 VEN: 37.4 mmHg — AB (ref 44.0–60.0)
PH VEN: 7.401 (ref 7.250–7.430)
PH VEN: 7.414 (ref 7.250–7.430)
PO2 VEN: 45 mmHg (ref 32.0–45.0)
PO2 VEN: 48 mmHg — AB (ref 32.0–45.0)
PO2 VEN: 78 mmHg — AB (ref 32.0–45.0)
TCO2: 24 mmol/L (ref 22–32)
TCO2: 24 mmol/L (ref 22–32)
TCO2: 24 mmol/L (ref 22–32)
TCO2: 25 mmol/L (ref 22–32)
TCO2: 20 mmol/L — ABNORMAL LOW (ref 22–32)
pCO2, Ven: 25.6 mmHg — ABNORMAL LOW (ref 44.0–60.0)
pCO2, Ven: 35.3 mmHg — ABNORMAL LOW (ref 44.0–60.0)
pCO2, Ven: 36.3 mmHg — ABNORMAL LOW (ref 44.0–60.0)
pCO2, Ven: 37.2 mmHg — ABNORMAL LOW (ref 44.0–60.0)
pH, Ven: 7.405 (ref 7.250–7.430)
pH, Ven: 7.42 (ref 7.250–7.430)
pH, Ven: 7.484 — ABNORMAL HIGH (ref 7.250–7.430)
pO2, Ven: 46 mmHg — ABNORMAL HIGH (ref 32.0–45.0)
pO2, Ven: 48 mmHg — ABNORMAL HIGH (ref 32.0–45.0)

## 2018-05-26 LAB — BASIC METABOLIC PANEL
Anion gap: 9 (ref 5–15)
BUN: 16 mg/dL (ref 8–23)
CHLORIDE: 108 mmol/L (ref 98–111)
CO2: 21 mmol/L — AB (ref 22–32)
CREATININE: 0.76 mg/dL (ref 0.61–1.24)
Calcium: 9.4 mg/dL (ref 8.9–10.3)
GFR calc non Af Amer: >60 mL/min (ref >60)
Glucose, Bld: 122 mg/dL — ABNORMAL HIGH (ref 70–99)
POTASSIUM: 4.1 mmol/L (ref 3.5–5.1)
Sodium: 138 mmol/L (ref 135–145)

## 2018-05-26 LAB — CBC
HEMATOCRIT: 49.8 % (ref 39.0–52.0)
HEMOGLOBIN: 15.6 g/dL (ref 13.0–17.0)
MCH: 30 pg (ref 26.0–34.0)
MCHC: 31.3 g/dL (ref 30.0–36.0)
MCV: 95.8 fL (ref 80.0–100.0)
Platelets: 161 10*3/uL (ref 150–400)
RBC: 5.2 MIL/uL (ref 4.22–5.81)
RDW: 13.1 % (ref 11.5–15.5)
WBC: 5.1 10*3/uL (ref 4.0–10.5)
nRBC: 0 % (ref 0.0–0.2)

## 2018-05-26 LAB — GLUCOSE, CAPILLARY
Glucose-Capillary: 116 mg/dL — ABNORMAL HIGH (ref 70–99)
Glucose-Capillary: 84 mg/dL (ref 70–99)

## 2018-05-26 SURGERY — RIGHT HEART CATH
Anesthesia: LOCAL

## 2018-05-26 MED ORDER — SODIUM CHLORIDE 0.9 % IV SOLN
INTRAVENOUS | Status: DC
  Administered 2018-05-26: 11:00:00 via INTRAVENOUS

## 2018-05-26 MED ORDER — ASPIRIN 81 MG PO CHEW
81.0000 mg | CHEWABLE_TABLET | ORAL | Status: AC
  Administered 2018-05-26: 81 mg via ORAL
  Filled 2018-05-26: qty 1

## 2018-05-26 MED ORDER — ACETAMINOPHEN 325 MG PO TABS: 650.0000 mg | ORAL_TABLET | ORAL | Status: DC | PRN

## 2018-05-26 MED ORDER — SODIUM CHLORIDE 0.9 % IV SOLN: 250.0000 mL | INTRAVENOUS | Status: DC | PRN

## 2018-05-26 MED ORDER — SODIUM CHLORIDE 0.9% FLUSH: 3.0000 mL | INTRAVENOUS | Status: DC | PRN

## 2018-05-26 MED ORDER — LIDOCAINE HCL (PF) 1 % IJ SOLN
INTRAMUSCULAR | Status: DC | PRN
  Administered 2018-05-26: 2 mL via INTRADERMAL

## 2018-05-26 MED ORDER — ONDANSETRON HCL 4 MG/2ML IJ SOLN: 4.0000 mg | Freq: Four times a day (QID) | INTRAMUSCULAR | Status: DC | PRN

## 2018-05-26 MED ORDER — SODIUM CHLORIDE 0.9% FLUSH: 3.0000 mL | Freq: Two times a day (BID) | INTRAVENOUS | Status: DC

## 2018-05-26 MED ORDER — HEPARIN (PORCINE) IN NACL 1000-0.9 UT/500ML-% IV SOLN
INTRAVENOUS | Status: DC | PRN
  Administered 2018-05-26: 500 mL

## 2018-05-26 SURGICAL SUPPLY — 6 items
CATH BALLN WEDGE 5F 110CM (CATHETERS) ×2 IMPLANT
PACK CARDIAC CATHETERIZATION (CUSTOM PROCEDURE TRAY) ×2 IMPLANT
SHEATH GLIDE SLENDER 4/5FR (SHEATH) ×2 IMPLANT
TRANSDUCER W/STOPCOCK (MISCELLANEOUS) ×2 IMPLANT
TUBING ART PRESS 72  MALE/FEM (TUBING) ×1
TUBING ART PRESS 72 MALE/FEM (TUBING) ×1 IMPLANT

## 2018-05-26 NOTE — H&P (Signed)
Advanced Heart Failure Team History and Physical Note   PCP:  Wenda Low, MD  PCP-Cardiology: Dorris Carnes, MD     Reason for Admission: RHC   HPI:    Patient with evidence for RV dysfunction by echo and cMRI presents today for RHC to assess filling pressures and for shunt.     Review of Systems: All systems reviewed and negative except as per HPI.   Home Medications Prior to Admission medications   Medication Sig Start Date End Date Taking? Authorizing Provider  Cholecalciferol (VITAMIN D) 2000 UNITS tablet Take 2,000 Units by mouth every other day.    Yes [provider]  famotidine (PEPCID) 20 MG tablet Take 20 mg by mouth daily as needed for heartburn.    Yes [provider]  furosemide (LASIX) 40 MG tablet Take 40 mg by mouth daily as needed for edema.   Yes [provider]  ibuprofen (ADVIL,MOTRIN) 200 MG tablet Take 600 mg by mouth every morning.   Yes [provider]  levothyroxine (SYNTHROID, LEVOTHROID) 50 MCG tablet Take 50 mcg by mouth every other day. Alternates with 75 mcg strength   Yes [provider]  levothyroxine (SYNTHROID, LEVOTHROID) 75 MCG tablet Take 75 mcg by mouth every other day. Alternates with 50 mcg strength   Yes [provider]  metFORMIN (GLUCOPHAGE) 500 MG tablet Take 500 mg by mouth at bedtime.  01/14/18  Yes [provider]  Multiple Vitamin (MULTIVITAMIN WITH MINERALS) TABS Take 1 tablet by mouth every other day.    Yes [provider]  Multiple Vitamins-Minerals (PRESERVISION AREDS 2 PO) Take 1 capsule by mouth 2 (two) times daily.   Yes [provider]  Omega-3 Fatty Acids (FISH OIL) 1200 MG CAPS Take 1,200 mg by mouth every other day.    Yes [provider]  simvastatin (ZOCOR) 20 MG tablet Take 20 mg by mouth at bedtime.    Yes [provider]  potassium chloride 20 MEQ TBCR Take 1 tab daily when taking torsemide Patient not taking: Reported  on 05/24/2018 06/08/16   Murlean Iba, MD    Past Medical History: Past Medical History:  Diagnosis Date  . Chronic right-sided CHF (congestive heart failure) ([place])    Echo 11/17: EF 55 and 60, normal wall motion, trivial AI, moderate RVE, mildly reduced RVSF, moderate to severe RAE // Echo 6/18: EF 60-65, Gr 1 DD, trivial AI, mild RVE, mild RAE.  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 Glucose intolerance (impaired glucose tolerance)   . Hyperlipemia    takes Simvastatin ~ every other day  . Melanoma of skin ([place])    left shoulder  . Mitral valve prolapse    hx of - seen years ago by Dr Enid Derry in [place], [place]  . Sleep apnea    cpap 16    Past Surgical History: Past Surgical History:  Procedure Laterality Date  . CATARACT EXTRACTION Left   . COLONOSCOPY     polyps in past  . COLONOSCOPY WITH PROPOFOL N/A 08/14/2014   Procedure: COLONOSCOPY WITH PROPOFOL;  Surgeon: Garlan Fair, MD;  Location: [facility]COPY;  Service: Endoscopy;  Laterality: N/A;  . MELANOMA EXCISION    . MENISCUS REPAIR Left 1970  . RETINAL DETACHMENT SURGERY    . SPINE SURGERY     4'14 lumbar 3,4,5 fusion([place])  . TONSILLECTOMY  Family History:  Family History  Problem Relation Age of Onset  . Heart failure Mother   . Heart disease Mother   . Atrial fibrillation Mother   . Mitral valve prolapse Mother   . Hypertension Father   . Heart attack Father        died from MI during CA surgery  . Cancer Father     Social History: Social History   Socioeconomic History  . Marital status: Married    Spouse name: Not on file  . Number of children: Not on file  . Years of education: Not on file  . Highest education level: Not on file  Occupational History  . Not on file  Social Needs  . Financial resource strain: Not on file  . Food insecurity:    Worry: Not on file    Inability: Not on file  . Transportation needs:      Medical: Not on file    Non-medical: Not on file  Tobacco Use  . Smoking status: Never Smoker  . Smokeless tobacco: Never Used  Substance and Sexual Activity  . Alcohol use: Yes    Alcohol/week: 1.0 - 3.0 standard drinks    Types: 1 - 3 Glasses of wine per week    Comment: x2 weekly   . Drug use: No  . Sexual activity: Not on file  Lifestyle  . Physical activity:    Days per week: Not on file    Minutes per session: Not on file  . Stress: Not on file  Relationships  . Social connections:    Talks on phone: Not on file    Gets together: Not on file    Attends religious service: Not on file    Active member of club or organization: Not on file    Attends meetings of clubs or organizations: Not on file    Relationship status: Not on file  Other Topics Concern  . Not on file  Social History Narrative   Married   1 daughter - deceased   Software engineer - retired; works a little    Allergies:  No Known Allergies  Objective:    Vital Signs:   Temp:  [97.6 F (36.4 C)] 97.6 F (36.4 C) (11/14 0958) Pulse Rate:  [65] 65 (11/14 0958) Resp:  [18] 18 (11/14 0958) BP: (155)/(83) 155/83 (11/14 0958) SpO2:  [100 %] 100 % (11/14 0958) Weight:  [127 kg] 127 kg (11/14 0958)   Filed Weights   05/26/18 0958  Weight: 127 kg     Physical Exam     General:  Well appearing. No respiratory difficulty HEENT: Normal Neck: Supple. no JVD. Carotids 2+ bilat; no bruits. No lymphadenopathy or thyromegaly appreciated. Cor: PMI nondisplaced. Regular rate & rhythm. No rubs, gallops or murmurs. Lungs: Clear Abdomen: Soft, nontender, nondistended. No hepatosplenomegaly. No bruits or masses. Good bowel sounds. Extremities: No cyanosis, clubbing, rash, edema Neuro: Alert & oriented x 3, cranial nerves grossly intact. moves all 4 extremities w/o difficulty. Affect pleasant.   Labs     Basic Metabolic Panel: Recent Labs  Lab 05/26/18 1037  NA 138  K 4.1  CL 108  CO2 21*  GLUCOSE  122*  BUN 16  CREATININE 0.76  CALCIUM 9.4    Liver Function Tests: No results for input(s): AST, ALT, ALKPHOS, BILITOT, PROT, ALBUMIN in the last 168 hours. No results for input(s): LIPASE, AMYLASE in the last 168 hours. No results for input(s): AMMONIA in the last 168 hours.  CBC: Recent Labs  Lab 05/26/18 1037  WBC 5.1  HGB 15.6  HCT 49.8  MCV 95.8  PLT 161    Cardiac Enzymes: No results for input(s): CKTOTAL, CKMB, CKMBINDEX, TROPONINI in the last 168 hours.  BNP: BNP (last 3 results) No results for input(s): BNP in the last 8760 hours.  ProBNP (last 3 results) No results for input(s): PROBNP in the last 8760 hours.   CBG: Recent Labs  Lab 05/26/18 1004  GLUCAP 116*    Coagulation Studies: No results for input(s): LABPROT, INR in the last 72 hours.  Imaging:  No results found.   Assessment/Plan   72 yo with history of OSA and abnormal RV function by echo/MRI presents for RHC to assess filling pressures and for shunt.    Loralie Champagne, MD 05/26/2018, 11:53 AM  Advanced Heart Failure Team Pager [phone] (M-F; 7a - 4p)  Please contact [facility] for night-coverage after hours (4p -7a ) and weekends on [email or web address]

## 2018-05-26 NOTE — Discharge Instructions (Signed)
Right heart cath Care After This sheet gives you information about how to care for yourself after your procedure. Your health care provider may also give you more specific instructions. If you have problems or questions, contact your health care provider. What can I expect after the procedure? After the procedure, it is common to have:  Bruising or mild discomfort in the area where the IV was inserted (insertion site).  Follow these instructions at home: Eating and drinking  Follow instructions from your health care provider about eating or drinking restrictions.  Drink a lot of fluids for the first several days after the procedure, as directed by your health care provider. This helps to wash (flush) the contrast out of your body. Examples of healthy fluids include water or low-calorie drinks. General instructions  Check your IV insertion area every day for signs of infection. Check for: ? Redness, swelling, or pain. ? Fluid or blood. ? Warmth. ? Pus or a bad smell.  Take over-the-counter and prescription medicines only as told by your health care provider.  Rest and return to your normal activities as told by your health care provider. Ask your health care provider what activities are safe for you.  Do not drive for 24 hours if you were given a medicine to help you relax (sedative), or until your health care provider approves.  Keep all follow-up visits as told by your health care provider. This is important. Contact a health care provider if:  Your skin becomes itchy or you develop a rash or hives.  You have a fever that does not get better with medicine.  You feel nauseous.  You vomit.  You have redness, swelling, or pain around the insertion site.  You have fluid or blood coming from the insertion site.  Your insertion area feels warm to the touch.  You have pus or a bad smell coming from the insertion site. Get help right away if:  You have difficulty breathing or  shortness of breath.  You develop chest pain.  You faint.  You feel very dizzy. These symptoms may represent a serious problem that is an emergency. Do not wait to see if the symptoms will go away. Get medical help right away. Call your local emergency services (911 in the [place] Do not drive yourself to the hospital. Summary  After your procedure, it is common to have bruising or mild discomfort in the area where the IV was inserted.  You should check your IV insertion area every day for signs of infection.  Take over-the-counter and prescription medicines only as told by your health care provider.  You should drink a lot of fluids for the first several days after the procedure to help flush the contrast from your body. This information is not intended to replace advice given to you by your health care provider. Make sure you discuss any questions you have with your health care provider. Document Released: 04/19/2013 Document Revised: 05/23/2016 Document Reviewed: 05/23/2016 Elsevier Interactive Patient Education  2017 [employer].

## 2018-05-26 NOTE — Research (Signed)
PHDEV Informed Consent   Subject Name: Matthew Robinson  Subject met inclusion and exclusion criteria.  The informed consent form, study requirements and expectations were reviewed with the subject and questions and concerns were addressed prior to the signing of the consent form.  The subject verbalized understanding of the trail requirements.  The subject agreed to participate in the [facility] trial and signed the informed consent.  The informed consent was obtained prior to performance of any protocol-specific procedures for the subject.  A copy of the signed informed consent was given to the subject and a copy was placed in the subject's medical record.  Moksh Loomer 05/26/2018, 1055

## 2018-05-27 ENCOUNTER — Encounter (HOSPITAL_COMMUNITY): Payer: Self-pay | Admitting: Cardiology

## 2018-06-27 DIAGNOSIS — D225 Melanocytic nevi of trunk: Secondary | ICD-10-CM | POA: Diagnosis not present

## 2018-06-27 DIAGNOSIS — D485 Neoplasm of uncertain behavior of skin: Secondary | ICD-10-CM | POA: Diagnosis not present

## 2018-06-27 DIAGNOSIS — D2262 Melanocytic nevi of left upper limb, including shoulder: Secondary | ICD-10-CM | POA: Diagnosis not present

## 2018-06-27 DIAGNOSIS — L570 Actinic keratosis: Secondary | ICD-10-CM | POA: Diagnosis not present

## 2018-06-27 DIAGNOSIS — L821 Other seborrheic keratosis: Secondary | ICD-10-CM | POA: Diagnosis not present

## 2018-06-27 DIAGNOSIS — D1801 Hemangioma of skin and subcutaneous tissue: Secondary | ICD-10-CM | POA: Diagnosis not present

## 2018-06-27 DIAGNOSIS — Z85820 Personal history of malignant melanoma of skin: Secondary | ICD-10-CM | POA: Diagnosis not present

## 2018-06-27 DIAGNOSIS — L986 Other infiltrative disorders of the skin and subcutaneous tissue: Secondary | ICD-10-CM | POA: Diagnosis not present

## 2018-06-27 DIAGNOSIS — D2261 Melanocytic nevi of right upper limb, including shoulder: Secondary | ICD-10-CM | POA: Diagnosis not present

## 2018-07-07 ENCOUNTER — Encounter: Payer: Self-pay | Admitting: Physician Assistant

## 2018-08-04 NOTE — Progress Notes (Signed)
Cardiology Office Note:    Date:  08/05/2018   ID:  Matthew Robinson, DOB 1946/01/27, MRN [id number]  PCP:  Wenda Low, MD  Cardiologist:  Dorris Carnes, MD  Electrophysiologist:  None   Referring MD: Wenda Low, MD   Chief Complaint  Patient presents with  . Follow-up    R sided CHF    History of Present Illness:    Matthew Robinson is a 73 y.o. male with diabetes, mitral valve prolapse, sleep apnea, hyperlipidemia.  He was admitted 11/2017with LE cellulitis associated with strep bacteremia. He developed volume excess and required IV Lasix. Echo showed normal LVEF and mildly reduced RVSF. Cardiac MRI in October 2019 demonstrated moderate RV enlargement with preserved EF, normal LV size and function.  Right heart catheterization in November 2019 demonstrated normal filling pressures, normal pulmonary pressures, no evidence of shunt lesion on shunt run and high cardiac output.   Matthew Robinson returns for follow-up.  He is doing well.  He takes Lasix about every 3 to 4 days.  He denies chest pain, shortness of breath, syncope.  Prior CV studies:   The following studies were reviewed today:  R Cardiac Catheterization 05/26/18 Mean RA 6, RV 33/8, mean PA 21, PCWP 9, CO 12.5, CI 4.8 1. Normal filling pressures.   2. Normal pulmonary artery pressure 3. High cardiac output.  4. No evidence for shunt lesion on shunt run.   Cardiac MRI 05/02/18 IMPRESSION: 1. Moderate RV enlargement with areas of mid and basal hypokinesis but overall EF preserved 51% with no RVH 2.   Normal LV size and function EF 72% 3.  No obvious ASD/PFO/VSD PV;s not adequately seen Patient should have f/u right heart cath with shunt run and consider cardiac CT to further r/o anomalous pulmonary veins or other central vascular shunts Can consider also returning for RV dysplasia sequences and Qp/Qs by MRI  Echo 02/07/18 EF 55-60, no RWMA, Gr 1 DD, mild AI, mod RVE, mod RAE, PASP 31  Nuclear stress test  01/28/18 Normal resting and stress perfusion. No ischemia or infarction EF 52%  Echo 12/24/16 - Left ventricle: The cavity size was normal. Wall thickness was normal. Systolic function was normal. The estimated ejection fraction was in the range of 60% to 65%. Doppler parameters are consistent with abnormal left ventricular relaxation (grade 1 diastolic dysfunction). - Aortic valve: There was trivial regurgitation. - Right ventricle: The cavity size was mildly dilated. - Right atrium: The atrium was mildly dilated.  Echo 06/02/16 EF 55 and 60, normal wall motion, trivial AI, moderate RVE, mildly reduced RVSF, moderate to severe RAE  Past Medical History:  Diagnosis Date  . Chronic right-sided CHF (congestive heart failure) ([place])    Echo 11/17: EF 55 and 60, normal wall motion, trivial AI, moderate RVE, mildly reduced RVSF, moderate to severe RAE // Echo 6/18: EF 60-65, Gr 1 DD, trivial AI, mild RVE, mild RAE.  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 Glucose intolerance (impaired glucose tolerance)   . Hyperlipemia    takes Simvastatin ~ every other day  . Melanoma of skin ([place])    left shoulder  . Mitral valve prolapse    hx of - seen years ago by Dr Enid Derry in [place], [place]  . Sleep apnea    cpap 16   Surgical Hx: The patient  has a past surgical history that includes Melanoma excision; Retinal detachment surgery;  Colonoscopy; Meniscus repair (Left, 1970); Spine surgery; Cataract extraction (Left); Tonsillectomy; Colonoscopy with propofol (N/A, 08/14/2014); and RIGHT HEART CATH (N/A, 05/26/2018).   Current Medications: Current Meds  Medication Sig  . Cholecalciferol (VITAMIN D) 2000 UNITS tablet Take 2,000 Units by mouth every other day.   . famotidine (PEPCID) 20 MG tablet Take 20 mg by mouth daily as needed for heartburn.   . furosemide (LASIX) 40 MG tablet Take 40 mg by mouth daily as needed for edema.  Marland Kitchen ibuprofen  (ADVIL,MOTRIN) 200 MG tablet Take 600 mg by mouth every morning.  Marland Kitchen levothyroxine (SYNTHROID, LEVOTHROID) 50 MCG tablet Take 50 mcg by mouth every other day. Alternates with 75 mcg strength  . levothyroxine (SYNTHROID, LEVOTHROID) 75 MCG tablet Take 75 mcg by mouth every other day. Alternates with 50 mcg strength  . metFORMIN (GLUCOPHAGE) 500 MG tablet Take 500 mg by mouth at bedtime.   . Multiple Vitamin (MULTIVITAMIN WITH MINERALS) TABS Take 1 tablet by mouth every other day.   . Multiple Vitamins-Minerals (PRESERVISION AREDS 2 PO) Take 1 capsule by mouth 2 (two) times daily.  . Omega-3 Fatty Acids (FISH OIL) 1200 MG CAPS Take 1,200 mg by mouth every other day.   . potassium chloride 20 MEQ TBCR Take 1 tab daily when taking torsemide  . simvastatin (ZOCOR) 20 MG tablet Take 20 mg by mouth at bedtime.      Allergies:   Patient has no known allergies.   Social History   Tobacco Use  . Smoking status: Never Smoker  . Smokeless tobacco: Never Used  Substance Use Topics  . Alcohol use: Yes    Alcohol/week: 1.0 - 3.0 standard drinks    Types: 1 - 3 Glasses of wine per week    Comment: x2 weekly   . Drug use: No     Family Hx: The patient's family history includes Atrial fibrillation in his mother; Cancer in his father; Heart attack in his father; Heart disease in his mother; Heart failure in his mother; Hypertension in his father; Mitral valve prolapse in his mother.  ROS:   Please see the history of present illness.    ROS All other systems reviewed and are negative.   EKGs/Labs/Other Test Reviewed:    EKG:  EKG is  ordered today.  The ekg ordered today demonstrates normal sinus rhythm, heart rate 65, normal axis, QTC 416  Recent Labs: 05/26/2018: BUN 16; Creatinine, Ser 0.76; Hemoglobin 15.6; Platelets 161; Potassium 4.1; Sodium 138   Recent Lipid Panel No results found for: CHOL, TRIG, HDL, CHOLHDL, LDLCALC, LDLDIRECT  Physical Exam:    VS:  BP 120/70   Pulse 65   Ht 6'  6" (1.981 m)   Wt 289 lb 12.8 oz (131.5 kg)   SpO2 98%   BMI 33.49 kg/m     Wt Readings from Last 3 Encounters:  08/05/18 289 lb 12.8 oz (131.5 kg)  05/26/18 280 lb (127 kg)  04/15/18 287 lb 9.6 oz (130.5 kg)     Physical Exam  Constitutional: He is oriented to person, place, and time. He appears well-developed and well-nourished. No distress.  HENT:  Head: Normocephalic and atraumatic.  Neck: Neck supple. No JVD present.  Cardiovascular: Normal rate, regular rhythm, S1 normal and S2 normal.  No murmur heard. Pulmonary/Chest: Breath sounds normal. He has no rales.  Abdominal: Soft. There is no hepatomegaly.  Musculoskeletal:        General: No edema.  Neurological: He is alert and oriented to  person, place, and time.  Skin: Skin is warm and dry.    ASSESSMENT & PLAN:    Chronic right-sided CHF (congestive heart failure) (H[facility]) Moderate RV enlargement by cardiac MRI in October 2019.  Right heart catheterization in November 2019 with normal filling pressures, normal pulmonary pressures and no shunt.  He takes Lasix as needed.  Continue current therapy.  Follow-up in 6 months or sooner if his swelling becomes difficult to manage.  OSA (obstructive sleep apnea) Continue CPAP.   Dispo:  Return in about 6 months (around 02/03/2019) for Routine Follow Up, w/ Dr. Harrington Challenger, or Richardson Dopp, PA-C.   Medication Adjustments/Labs and Tests Ordered: Current medicines are reviewed at length with the patient today.  Concerns regarding medicines are outlined above.  Tests Ordered: Orders Placed This Encounter  Procedures  . EKG 12-Lead   Medication Changes: No orders of the defined types were placed in this encounter.   Signed, Richardson Dopp, PA-C  08/05/2018 8:51 AM    [facility] [place], [place], [place]  [address] Phone: [phone]; Fax: [phone]

## 2018-08-05 ENCOUNTER — Ambulatory Visit (INDEPENDENT_AMBULATORY_CARE_PROVIDER_SITE_OTHER): Payer: Medicare Other | Admitting: Physician Assistant

## 2018-08-05 ENCOUNTER — Encounter (INDEPENDENT_AMBULATORY_CARE_PROVIDER_SITE_OTHER): Payer: Self-pay

## 2018-08-05 ENCOUNTER — Encounter: Payer: Self-pay | Admitting: Physician Assistant

## 2018-08-05 VITALS — BP 120/70 | HR 65 | Ht 78.0 in | Wt 289.8 lb

## 2018-08-05 DIAGNOSIS — G4733 Obstructive sleep apnea (adult) (pediatric): Secondary | ICD-10-CM

## 2018-08-05 DIAGNOSIS — I50812 Chronic right heart failure: Secondary | ICD-10-CM | POA: Diagnosis not present

## 2018-08-05 NOTE — Patient Instructions (Signed)
Medication Instructions:  Your physician recommends that you continue on your current medications as directed. Please refer to the Current Medication list given to you today.  If you need a refill on your cardiac medications before your next appointment, please call your pharmacy.   Lab work: NONE If you have labs (blood work) drawn today and your tests are completely normal, you will receive your results only by: . MyChart Message (if you have MyChart) OR . A paper copy in the mail If you have any lab test that is abnormal or we need to change your treatment, we will call you to review the results.  Testing/Procedures: NONE  Follow-Up: At [facility], you and your health needs are our priority.  As part of our continuing mission to provide you with exceptional heart care, we have created designated Provider Care Teams.  These Care Teams include your primary Cardiologist (physician) and Advanced Practice Providers (APPs -  Physician Assistants and Nurse Practitioners) who all work together to provide you with the care you need, when you need it. You will need a follow up appointment in:  6 months.  Please call our office 2 months in advance to schedule this appointment.  You may see Paula Ross, MD or one of the following Advanced Practice Providers on your designated Care Team: Scott Weaver, PA-C Vin Bhagat, PA-C . Janine Hammond, NP  Any Other Special Instructions Will Be Listed Below (If Applicable).    

## 2018-09-21 DIAGNOSIS — D696 Thrombocytopenia, unspecified: Secondary | ICD-10-CM | POA: Diagnosis not present

## 2018-09-21 DIAGNOSIS — R972 Elevated prostate specific antigen [PSA]: Secondary | ICD-10-CM | POA: Diagnosis not present

## 2018-09-21 DIAGNOSIS — E1140 Type 2 diabetes mellitus with diabetic neuropathy, unspecified: Secondary | ICD-10-CM | POA: Diagnosis not present

## 2018-09-21 DIAGNOSIS — G4730 Sleep apnea, unspecified: Secondary | ICD-10-CM | POA: Diagnosis not present

## 2018-09-21 DIAGNOSIS — Z794 Long term (current) use of insulin: Secondary | ICD-10-CM | POA: Diagnosis not present

## 2018-09-21 DIAGNOSIS — E7800 Pure hypercholesterolemia, unspecified: Secondary | ICD-10-CM | POA: Diagnosis not present

## 2018-09-21 DIAGNOSIS — E039 Hypothyroidism, unspecified: Secondary | ICD-10-CM | POA: Diagnosis not present

## 2018-09-21 DIAGNOSIS — I509 Heart failure, unspecified: Secondary | ICD-10-CM | POA: Diagnosis not present

## 2018-09-21 DIAGNOSIS — C439 Malignant melanoma of skin, unspecified: Secondary | ICD-10-CM | POA: Diagnosis not present

## 2018-10-06 DIAGNOSIS — R972 Elevated prostate specific antigen [PSA]: Secondary | ICD-10-CM | POA: Diagnosis not present

## 2018-10-11 DIAGNOSIS — R972 Elevated prostate specific antigen [PSA]: Secondary | ICD-10-CM | POA: Diagnosis not present

## 2019-01-01 ENCOUNTER — Other Ambulatory Visit: Payer: Self-pay | Admitting: *Deleted

## 2019-01-01 DIAGNOSIS — Z20822 Contact with and (suspected) exposure to covid-19: Secondary | ICD-10-CM

## 2019-01-04 NOTE — Addendum Note (Signed)
Addended by: Brigitte Pulse on: 01/04/2019 09:36 PM   Modules accepted: Orders

## 2019-01-23 DIAGNOSIS — D1801 Hemangioma of skin and subcutaneous tissue: Secondary | ICD-10-CM | POA: Diagnosis not present

## 2019-01-23 DIAGNOSIS — Z85820 Personal history of malignant melanoma of skin: Secondary | ICD-10-CM | POA: Diagnosis not present

## 2019-01-23 DIAGNOSIS — L821 Other seborrheic keratosis: Secondary | ICD-10-CM | POA: Diagnosis not present

## 2019-01-23 DIAGNOSIS — L814 Other melanin hyperpigmentation: Secondary | ICD-10-CM | POA: Diagnosis not present

## 2019-01-23 DIAGNOSIS — L570 Actinic keratosis: Secondary | ICD-10-CM | POA: Diagnosis not present

## 2019-01-23 DIAGNOSIS — D485 Neoplasm of uncertain behavior of skin: Secondary | ICD-10-CM | POA: Diagnosis not present

## 2019-01-23 DIAGNOSIS — D225 Melanocytic nevi of trunk: Secondary | ICD-10-CM | POA: Diagnosis not present

## 2019-02-06 ENCOUNTER — Telehealth: Payer: Self-pay | Admitting: Physician Assistant

## 2019-02-06 NOTE — Telephone Encounter (Signed)

## 2019-02-07 ENCOUNTER — Ambulatory Visit (INDEPENDENT_AMBULATORY_CARE_PROVIDER_SITE_OTHER): Payer: Medicare Other | Admitting: Physician Assistant

## 2019-02-07 ENCOUNTER — Other Ambulatory Visit: Payer: Self-pay

## 2019-02-07 ENCOUNTER — Encounter: Payer: Self-pay | Admitting: Physician Assistant

## 2019-02-07 VITALS — BP 120/76 | HR 61 | Ht 78.0 in | Wt 278.8 lb

## 2019-02-07 DIAGNOSIS — G4733 Obstructive sleep apnea (adult) (pediatric): Secondary | ICD-10-CM | POA: Diagnosis not present

## 2019-02-07 DIAGNOSIS — I50812 Chronic right heart failure: Secondary | ICD-10-CM

## 2019-02-07 MED ORDER — FUROSEMIDE 40 MG PO TABS: 40.0000 mg | ORAL_TABLET | Freq: Every day | ORAL | 3 refills | Status: DC | PRN

## 2019-02-07 NOTE — Patient Instructions (Addendum)
Medication Instructions:  Your physician recommends that you continue on your current medications as directed. Please refer to the Current Medication list given to you today.   If you need a refill on your cardiac medications before your next appointment, please call your pharmacy.   Lab work: NONE  If you have labs (blood work) drawn today and your tests are completely normal, you will receive your results only by: Marland Kitchen MyChart Message (if you have MyChart) OR . A paper copy in the mail If you have any lab test that is abnormal or we need to change your treatment, we will call you to review the results.  Testing/Procedures: NONE  Follow-Up: At [facility], you and your health needs are our priority.  As part of our continuing mission to provide you with exceptional heart care, we have created designated Provider Care Teams.  These Care Teams include your primary Cardiologist (physician) and Advanced Practice Providers (APPs -  Physician Assistants and Nurse Practitioners) who all work together to provide you with the care you need, when you need it. . You will need a follow up appointment in:  12 months.  Please call our office 2 months in advance to schedule this appointment.  You may see Dorris Carnes, MD or  Richardson Dopp, PA-C  Any Other Special Instructions Will Be Listed Below (If Applicable).

## 2019-02-07 NOTE — Progress Notes (Signed)
Cardiology Office Note:    Date:  02/07/2019   ID:  Matthew Robinson, DOB Feb 01, 1946, MRN [id number]  PCP:  Wenda Low, MD  Cardiologist:  Dorris Carnes, MD   Electrophysiologist:  None   Referring MD: Wenda Low, MD   Chief Complaint  Patient presents with   Follow-up    R sided CHF     History of Present Illness:    Matthew Robinson is a 73 y.o. male with:  R sided CHF  Echocardiogram 11/17: mild reduced RVEF  cMRI 10/19: mod RVE, preserved EF  Diabetes mellitus   MVP  OSA  Hyperlipidemia   Matthew Robinson was last seen in Jan 2020.  He is here for follow up.  He is doing well.  He hs not been able to exercise.  He usually swims at the [facility].  He takes Lasix about 2 times a week for leg swelling.  He has not had significant shortness of breath.  He has not had chest pain.  He uses CPAP at night.  He has not had syncope.    Prior CV studies:   The following studies were reviewed today:  R Cardiac Catheterization 05/26/18 Mean RA 6, RV 33/8, mean PA 21, PCWP 9, CO 12.5, CI 4.8 1. Normal filling pressures.  2. Normal pulmonary artery pressure 3. High cardiac output.  4. No evidence for shunt lesion on shunt run.   Cardiac MRI 05/02/18 IMPRESSION: 1. Moderate RV enlargement with areas of mid and basal hypokinesis but overall EF preserved 51% with no RVH 2. Normal LV size and function EF 72% 3. No obvious ASD/PFO/VSD PV;s not adequately seen Patient should have f/u right heart cath with shunt run and consider cardiac CT to further r/o anomalous pulmonary veins or other central vascular shunts Can consider also returning for RV dysplasia sequences and Qp/Qs by MRI  Echo 02/07/18 EF 55-60, no RWMA, Gr 1 DD, mild AI, mod RVE, mod RAE, PASP 31  Nuclear stress test 01/28/18 Normal resting and stress perfusion. No ischemia or infarction EF 52%  Echo 12/24/16 - Left ventricle: The cavity size was normal. Wall thickness was normal. Systolic function was  normal. The estimated ejection fraction was in the range of 60% to 65%. Doppler parameters are consistent with abnormal left ventricular relaxation (grade 1 diastolic dysfunction). - Aortic valve: There was trivial regurgitation. - Right ventricle: The cavity size was mildly dilated. - Right atrium: The atrium was mildly dilated.  Echo 06/02/16 EF 55 and 60, normal wall motion, trivial AI, moderate RVE, mildly reduced RVSF, moderate to severe RAE  Past Medical History:  Diagnosis Date   Chronic right-sided CHF (congestive heart failure) ([place])    Echo 11/17: EF 55 and 60, normal wall motion, trivial AI, moderate RVE, mildly reduced RVSF, moderate to severe RAE // Echo 6/18: EF 60-65, Gr 1 DD, trivial AI, mild RVE, mild RAE.   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; auditory nerve failure; + balance issues   Glucose intolerance (impaired glucose tolerance)    Hyperlipemia    takes Simvastatin ~ every other day   Melanoma of skin ([place])    left shoulder   Mitral valve prolapse    hx of - seen years ago by Dr Enid Derry in [facility], [place]   Sleep apnea    cpap 16   Surgical Hx: The patient  has a past surgical history that includes Melanoma excision; Retinal detachment surgery; Colonoscopy; Meniscus repair (Left,  1970); Spine surgery; Cataract extraction (Left); Tonsillectomy; Colonoscopy with propofol (N/A, 08/14/2014); and RIGHT HEART CATH (N/A, 05/26/2018).   Current Medications: Current Meds  Medication Sig   Cholecalciferol (VITAMIN D) 2000 UNITS tablet Take 2,000 Units by mouth every other day.    famotidine (PEPCID) 20 MG tablet Take 20 mg by mouth daily as needed for heartburn.    furosemide (LASIX) 40 MG tablet Take 1 tablet (40 mg total) by mouth daily as needed for edema.   ibuprofen (ADVIL,MOTRIN) 200 MG tablet Take 600 mg by mouth every morning.   levothyroxine (SYNTHROID, LEVOTHROID) 50 MCG tablet Take 50 mcg by mouth every other day. Alternates with 75 mcg  strength   levothyroxine (SYNTHROID, LEVOTHROID) 75 MCG tablet Take 75 mcg by mouth every other day. Alternates with 50 mcg strength   metFORMIN (GLUCOPHAGE) 500 MG tablet Take 500 mg by mouth at bedtime.    Multiple Vitamin (MULTIVITAMIN WITH MINERALS) TABS Take 1 tablet by mouth every other day.    Multiple Vitamins-Minerals (PRESERVISION AREDS 2 PO) Take 1 capsule by mouth 2 (two) times daily.   Omega-3 Fatty Acids (FISH OIL) 1200 MG CAPS Take 1,200 mg by mouth every other day.    potassium chloride 20 MEQ TBCR Take 1 tab daily when taking torsemide   simvastatin (ZOCOR) 20 MG tablet Take 20 mg by mouth at bedtime.    [DISCONTINUED] furosemide (LASIX) 40 MG tablet Take 40 mg by mouth daily as needed for edema.     Allergies:   Patient has no known allergies.   Social History   Tobacco Use   Smoking status: Never Smoker   Smokeless tobacco: Never Used  Substance Use Topics   Alcohol use: Yes    Alcohol/week: 1.0 - 3.0 standard drinks    Types: 1 - 3 Glasses of wine per week    Comment: x2 weekly    Drug use: No     Family Hx: The patient's family history includes Atrial fibrillation in his mother; Cancer in his father; Heart attack in his father; Heart disease in his mother; Heart failure in his mother; Hypertension in his father; Mitral valve prolapse in his mother.  ROS:   Please see the history of present illness.    ROS All other systems reviewed and are negative.   EKGs/Labs/Other Test Reviewed:    EKG:  EKG is   ordered today.  The ekg ordered today demonstrates normal sinus rhythm, HR 61, PAC, QTc 412, no changes.   Recent Labs: 05/26/2018: BUN 16; Creatinine, Ser 0.76; Hemoglobin 15.6; Platelets 161; Potassium 4.1; Sodium 138   Recent Lipid Panel No results found for: CHOL, TRIG, HDL, CHOLHDL, LDLCALC, LDLDIRECT  Physical Exam:    VS:  BP 120/76    Pulse 61    Ht 6\' 6"  (1.981 m)    Wt 278 lb 12.8 oz (126.5 kg)    SpO2 93%    BMI 32.22 kg/m     Wt  Readings from Last 3 Encounters:  02/07/19 278 lb 12.8 oz (126.5 kg)  08/05/18 289 lb 12.8 oz (131.5 kg)  05/26/18 280 lb (127 kg)     Physical Exam  Constitutional: He is oriented to person, place, and time. He appears well-developed and well-nourished. No distress.  HENT:  Head: Normocephalic and atraumatic.  Neck: Neck supple. No JVD present.  Cardiovascular: Normal rate, regular rhythm, S1 normal and S2 normal. Exam reveals an opening snap (faint).  No murmur heard. Pulmonary/Chest: Breath sounds normal. He  has no rales.  Abdominal: Soft. There is no hepatomegaly.  Musculoskeletal:        General: No edema.  Neurological: He is alert and oriented to person, place, and time.  Skin: Skin is warm and dry.    ASSESSMENT & PLAN:    1. Chronic right-sided CHF (congestive heart failure) (H[facility]) Moderate RV enlargement by cardiac MRI in October 2019.  Right heart catheterization in November 2019 with normal filling pressures, normal pulmonary pressures and no shunt.   Overall, his leg swelling is well controlled on as needed Lasix.  FU in 1 year.   2. OSA (obstructive sleep apnea) Continue CPAP.     Dispo:  Return in about 1 year (around 02/07/2020) for w/ Dr. Harrington Challenger, or Richardson Dopp, PA-C.   Medication Adjustments/Labs and Tests Ordered: Current medicines are reviewed at length with the patient today.  Concerns regarding medicines are outlined above.  Tests Ordered: Orders Placed This Encounter  Procedures   EKG 12-Lead   Medication Changes: Meds ordered this encounter  Medications   furosemide (LASIX) 40 MG tablet    Sig: Take 1 tablet (40 mg total) by mouth daily as needed for edema.    Dispense:  90 tablet    Refill:  3    Signed, Richardson Dopp, PA-C  02/07/2019 12:37 PM    [address], [place], [place]  [address] Phone: [phone]; Fax: [phone]

## 2019-02-16 DIAGNOSIS — Z961 Presence of intraocular lens: Secondary | ICD-10-CM | POA: Diagnosis not present

## 2019-02-16 DIAGNOSIS — H353131 Nonexudative age-related macular degeneration, bilateral, early dry stage: Secondary | ICD-10-CM | POA: Diagnosis not present

## 2019-02-16 DIAGNOSIS — H26491 Other secondary cataract, right eye: Secondary | ICD-10-CM | POA: Diagnosis not present

## 2019-02-16 DIAGNOSIS — E119 Type 2 diabetes mellitus without complications: Secondary | ICD-10-CM | POA: Diagnosis not present

## 2019-03-24 DIAGNOSIS — D696 Thrombocytopenia, unspecified: Secondary | ICD-10-CM | POA: Diagnosis not present

## 2019-03-24 DIAGNOSIS — E7800 Pure hypercholesterolemia, unspecified: Secondary | ICD-10-CM | POA: Diagnosis not present

## 2019-03-24 DIAGNOSIS — G4730 Sleep apnea, unspecified: Secondary | ICD-10-CM | POA: Diagnosis not present

## 2019-03-24 DIAGNOSIS — M519 Unspecified thoracic, thoracolumbar and lumbosacral intervertebral disc disorder: Secondary | ICD-10-CM | POA: Diagnosis not present

## 2019-03-24 DIAGNOSIS — Z0000 Encounter for general adult medical examination without abnormal findings: Secondary | ICD-10-CM | POA: Diagnosis not present

## 2019-03-24 DIAGNOSIS — C439 Malignant melanoma of skin, unspecified: Secondary | ICD-10-CM | POA: Diagnosis not present

## 2019-03-24 DIAGNOSIS — R972 Elevated prostate specific antigen [PSA]: Secondary | ICD-10-CM | POA: Diagnosis not present

## 2019-03-24 DIAGNOSIS — I5030 Unspecified diastolic (congestive) heart failure: Secondary | ICD-10-CM | POA: Diagnosis not present

## 2019-03-24 DIAGNOSIS — E039 Hypothyroidism, unspecified: Secondary | ICD-10-CM | POA: Diagnosis not present

## 2019-03-24 DIAGNOSIS — J309 Allergic rhinitis, unspecified: Secondary | ICD-10-CM | POA: Diagnosis not present

## 2019-03-24 DIAGNOSIS — N400 Enlarged prostate without lower urinary tract symptoms: Secondary | ICD-10-CM | POA: Diagnosis not present

## 2019-03-24 DIAGNOSIS — E1140 Type 2 diabetes mellitus with diabetic neuropathy, unspecified: Secondary | ICD-10-CM | POA: Diagnosis not present

## 2019-03-24 DIAGNOSIS — Z23 Encounter for immunization: Secondary | ICD-10-CM | POA: Diagnosis not present

## 2019-03-24 DIAGNOSIS — I872 Venous insufficiency (chronic) (peripheral): Secondary | ICD-10-CM | POA: Diagnosis not present

## 2019-03-24 DIAGNOSIS — Z1389 Encounter for screening for other disorder: Secondary | ICD-10-CM | POA: Diagnosis not present

## 2019-07-22 ENCOUNTER — Ambulatory Visit: Payer: Medicare Other | Attending: Internal Medicine

## 2019-07-22 DIAGNOSIS — Z23 Encounter for immunization: Secondary | ICD-10-CM | POA: Insufficient documentation

## 2019-07-22 NOTE — Progress Notes (Signed)
   Covid-19 Vaccination Clinic  Name:  Matthew Robinson    MRN: [id number] DOB: 04/28/1946  07/22/2019  Matthew Robinson was observed post Covid-19 immunization for 15 minutes without incidence. He was provided with Vaccine Information Sheet and instruction to access the V-Safe system.   Matthew Robinson was instructed to call 911 with any severe reactions post vaccine: Marland Kitchen Difficulty breathing  . Swelling of your face and throat  . A fast heartbeat  . A bad rash all over your body  . Dizziness and weakness    Immunizations Administered    Name Date Dose VIS Date Route   Pfizer COVID-19 Vaccine 07/22/2019 11:19 AM 0.3 mL 06/23/2019 Intramuscular   Manufacturer: [employer]   Lot: [id number]   [place]: [id number]

## 2019-07-27 DIAGNOSIS — L821 Other seborrheic keratosis: Secondary | ICD-10-CM | POA: Diagnosis not present

## 2019-07-27 DIAGNOSIS — L570 Actinic keratosis: Secondary | ICD-10-CM | POA: Diagnosis not present

## 2019-07-27 DIAGNOSIS — D485 Neoplasm of uncertain behavior of skin: Secondary | ICD-10-CM | POA: Diagnosis not present

## 2019-07-27 DIAGNOSIS — D1721 Benign lipomatous neoplasm of skin and subcutaneous tissue of right arm: Secondary | ICD-10-CM | POA: Diagnosis not present

## 2019-07-27 DIAGNOSIS — Z85820 Personal history of malignant melanoma of skin: Secondary | ICD-10-CM | POA: Diagnosis not present

## 2019-07-27 DIAGNOSIS — L814 Other melanin hyperpigmentation: Secondary | ICD-10-CM | POA: Diagnosis not present

## 2019-07-29 ENCOUNTER — Ambulatory Visit: Payer: Medicare Other

## 2019-08-12 ENCOUNTER — Ambulatory Visit: Payer: Medicare Other | Attending: Internal Medicine

## 2019-08-12 DIAGNOSIS — Z23 Encounter for immunization: Secondary | ICD-10-CM

## 2019-08-12 NOTE — Progress Notes (Signed)
   Covid-19 Vaccination Clinic  Name:  [name]    MRN: [id number] DOB: 03-Jan-1946  08/12/2019  [name] was observed post Covid-19 immunization for 15 minutes without incidence. He was provided with Vaccine Information Sheet and instruction to access the V-Safe system.   [name] was instructed to call 911 with any severe reactions post vaccine: Marland Kitchen Difficulty breathing  . Swelling of your face and throat  . A fast heartbeat  . A bad rash all over your body  . Dizziness and weakness    Immunizations Administered    Name Date Dose VIS Date Route   Pfizer COVID-19 Vaccine 08/12/2019 10:52 AM 0.3 mL 06/23/2019 Intramuscular   Manufacturer: [place]   Lot: [id number]   [place]: [id number]

## 2019-08-24 DIAGNOSIS — H353131 Nonexudative age-related macular degeneration, bilateral, early dry stage: Secondary | ICD-10-CM | POA: Diagnosis not present

## 2019-08-24 DIAGNOSIS — Z961 Presence of intraocular lens: Secondary | ICD-10-CM | POA: Diagnosis not present

## 2019-08-24 DIAGNOSIS — E109 Type 1 diabetes mellitus without complications: Secondary | ICD-10-CM | POA: Diagnosis not present

## 2019-09-21 DIAGNOSIS — D696 Thrombocytopenia, unspecified: Secondary | ICD-10-CM | POA: Diagnosis not present

## 2019-09-21 DIAGNOSIS — E7800 Pure hypercholesterolemia, unspecified: Secondary | ICD-10-CM | POA: Diagnosis not present

## 2019-09-21 DIAGNOSIS — I872 Venous insufficiency (chronic) (peripheral): Secondary | ICD-10-CM | POA: Diagnosis not present

## 2019-09-21 DIAGNOSIS — E1140 Type 2 diabetes mellitus with diabetic neuropathy, unspecified: Secondary | ICD-10-CM | POA: Diagnosis not present

## 2019-09-21 DIAGNOSIS — I5030 Unspecified diastolic (congestive) heart failure: Secondary | ICD-10-CM | POA: Diagnosis not present

## 2019-09-21 DIAGNOSIS — C439 Malignant melanoma of skin, unspecified: Secondary | ICD-10-CM | POA: Diagnosis not present

## 2019-09-21 DIAGNOSIS — Z23 Encounter for immunization: Secondary | ICD-10-CM | POA: Diagnosis not present

## 2019-09-21 DIAGNOSIS — E039 Hypothyroidism, unspecified: Secondary | ICD-10-CM | POA: Diagnosis not present

## 2019-09-21 DIAGNOSIS — G4730 Sleep apnea, unspecified: Secondary | ICD-10-CM | POA: Diagnosis not present

## 2019-10-16 DIAGNOSIS — R972 Elevated prostate specific antigen [PSA]: Secondary | ICD-10-CM | POA: Diagnosis not present

## 2019-11-16 ENCOUNTER — Telehealth: Payer: Self-pay | Admitting: Internal Medicine

## 2019-11-16 NOTE — Telephone Encounter (Signed)
STAT if HR is under 50 or over 120 (normal HR is 60-100 beats per minute)  1) What is your heart rate?  101   2) Do you have a log of your heart rate readings (document readings)?  108 101 106  3) Do you have any other symptoms? No

## 2019-11-17 ENCOUNTER — Ambulatory Visit
Admission: RE | Admit: 2019-11-17 | Discharge: 2019-11-17 | Disposition: A | Discharge status: Home / Self Care | Payer: Medicare Other | Source: Ambulatory Visit | Attending: Physician Assistant | Admitting: Physician Assistant

## 2019-11-17 ENCOUNTER — Encounter: Payer: Self-pay | Admitting: Physician Assistant

## 2019-11-17 ENCOUNTER — Ambulatory Visit (INDEPENDENT_AMBULATORY_CARE_PROVIDER_SITE_OTHER): Payer: Medicare Other | Admitting: Physician Assistant

## 2019-11-17 ENCOUNTER — Other Ambulatory Visit: Payer: Self-pay

## 2019-11-17 VITALS — BP 120/80 | HR 113 | Ht 79.0 in | Wt 281.0 lb

## 2019-11-17 DIAGNOSIS — E785 Hyperlipidemia, unspecified: Secondary | ICD-10-CM | POA: Diagnosis not present

## 2019-11-17 DIAGNOSIS — I351 Nonrheumatic aortic (valve) insufficiency: Secondary | ICD-10-CM | POA: Diagnosis not present

## 2019-11-17 DIAGNOSIS — I4892 Unspecified atrial flutter: Secondary | ICD-10-CM

## 2019-11-17 DIAGNOSIS — E039 Hypothyroidism, unspecified: Secondary | ICD-10-CM

## 2019-11-17 DIAGNOSIS — I50812 Chronic right heart failure: Secondary | ICD-10-CM

## 2019-11-17 DIAGNOSIS — R0602 Shortness of breath: Secondary | ICD-10-CM | POA: Diagnosis not present

## 2019-11-17 DIAGNOSIS — I495 Sick sinus syndrome: Secondary | ICD-10-CM | POA: Diagnosis not present

## 2019-11-17 LAB — CBC
Hematocrit: 46.3 % (ref 37.5–51.0)
Hemoglobin: 15.5 g/dL (ref 13.0–17.7)
MCH: 31.3 pg (ref 26.6–33.0)
MCHC: 33.5 g/dL (ref 31.5–35.7)
MCV: 93 fL (ref 79–97)
Platelets: 168 10*3/uL (ref 150–450)
RBC: 4.96 x10E6/uL (ref 4.14–5.80)
RDW: 14.6 % (ref 11.6–15.4)
WBC: 6.7 10*3/uL (ref 3.4–10.8)

## 2019-11-17 LAB — BASIC METABOLIC PANEL
BUN/Creatinine Ratio: 19 (ref 10–24)
BUN: 18 mg/dL (ref 8–27)
CO2: 28 mmol/L (ref 20–29)
Calcium: 9.7 mg/dL (ref 8.6–10.2)
Chloride: 104 mmol/L (ref 96–106)
Creatinine, Ser: 0.94 mg/dL (ref 0.76–1.27)
GFR calc Af Amer: 91 mL/min/{1.73_m2} (ref >59)
GFR calc non Af Amer: 79 mL/min/{1.73_m2} (ref >59)
Glucose: 118 mg/dL — ABNORMAL HIGH (ref 65–99)
Potassium: 4.3 mmol/L (ref 3.5–5.2)
Sodium: 140 mmol/L (ref 134–144)

## 2019-11-17 LAB — MAGNESIUM: Magnesium: 2 mg/dL (ref 1.6–2.3)

## 2019-11-17 MED ORDER — APIXABAN 5 MG PO TABS: 5.0000 mg | ORAL_TABLET | Freq: Two times a day (BID) | ORAL | 5 refills | Status: DC

## 2019-11-17 MED ORDER — METOPROLOL SUCCINATE ER 25 MG PO TB24: 25.0000 mg | ORAL_TABLET | Freq: Every day | ORAL | 3 refills | Status: DC

## 2019-11-17 NOTE — Progress Notes (Signed)
Cardiology Office Note    Date:  11/17/2019   ID:  Matthew Robinson, DOB 09/17/1945, MRN [id number]  PCP:  Wenda Low, MD  Cardiologist:  Dorris Carnes, MD & Richardson Dopp, PA-C Electrophysiologist:  None   Chief Complaint: elevated heart rate (found to have new onset atrial flutter)  History of Present Illness:   Matthew Robinson is a 74 y.o. male with history of chronic right sided heart failure, mild aortic insufficiency, diabetes mellitus, MVP (not seen on recent echoes), OSA on CPAP, HLD (followed by primary care), deafness in left ear who presents for evaluation of elevated HR. He has history of prior cardiac workup demonstrating right ventricular dysfunction. 2D echo in 2017 demonstrated normal LVEF 55-60% with moderate RVE/mildly reduced RVSF. Nuclear stress test in 01/2018 was normal. LAst echo 01/2018 showed EF 55-60%, grade 1 DD, mild AI, moderately dilated RV, moderate RAE. cMRI in 04/2018 showed moderate RV enlargement with areas of mid and basal hypokinesis but overall EF preserved 51% with no RVH. Per MRI report, "Patient should have f/u right heart cath with shunt run and consider cardiac CT to further r/o anomalous pulmonary veins or other central vascular shunts. Can consider also returning for RV dysplasia sequences and Qp/Qs by MRI." He underwent right heart catheterization 05/2018 showing normal filling pressures, normal PA pressure, high cardiac output and no evidence for shunt lesion on shunt run. He has been managed with PRN Lasix and has done well. Last labs by [facility] in 03/2019 showed LDL 95, trig 172, Hgb 15.5, Cr 0.81, K 4.5, TSH wnl, ALT 30.  He called in to be seen today for elevated heart rate. About 10 days ago he checked his vitals on a [employer] machine and noticed his HR was 102 which was odd. He had a root canal this past week and they found similar heart rates. This seemed unusual for him so he called to get checked out. He notes chronic DOE and chronic "poor circulation" in  his legs ever since an episode of sepsis a few years ago, but denies any cardiac awareness of arrhythmia. No increase in LEE. No orthopnea, dyspnea, palpitations, dizziness or syncope. He denies prior bleeding issues.   Past Medical History:  Diagnosis Date  . Chronic right-sided CHF (congestive heart failure) ([place])    Echo 11/17: EF 55 and 60, normal wall motion, trivial AI, moderate RVE, mildly reduced RVSF, moderate to severe RAE // Echo 6/18: EF 60-65, Gr 1 DD, trivial AI, mild RVE, mild RAE.  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 Glucose intolerance (impaired glucose tolerance)   . Hyperlipemia   . Melanoma of skin ([place])    left shoulder  . Mild aortic insufficiency   . Mitral valve prolapse    hx of - seen years ago by Dr Enid Derry in [facility], [place] - not seen on recent echoes  . Sleep apnea    cpap 16    Past Surgical History:  Procedure Laterality Date  . CATARACT EXTRACTION Left   . COLONOSCOPY     polyps in past  . COLONOSCOPY WITH PROPOFOL N/A 08/14/2014   Procedure: COLONOSCOPY WITH PROPOFOL;  Surgeon: Garlan Fair, MD;  Location: [facility];  Service: Endoscopy;  Laterality: N/A;  . MELANOMA EXCISION    . MENISCUS REPAIR Left 1970  . RETINAL DETACHMENT SURGERY    . RIGHT HEART CATH N/A 05/26/2018   Procedure: RIGHT  HEART CATH;  Surgeon: Larey Dresser, MD;  Location: [place] [facility];  Service: Cardiovascular;  Laterality: N/A;  . SPINE SURGERY     4'14 lumbar 3,4,5 fusion([place])  . TONSILLECTOMY      Current Medications: Current Meds  Medication Sig  . Cholecalciferol (VITAMIN D) 2000 UNITS tablet Take 2,000 Units by mouth every other day.   . famotidine (PEPCID) 20 MG tablet Take 20 mg by mouth daily as needed for heartburn.   . furosemide (LASIX) 40 MG tablet Take 1 tablet (40 mg total) by mouth daily as needed for edema.  Marland Kitchen levothyroxine (SYNTHROID, LEVOTHROID) 50 MCG tablet Take 50 mcg  by mouth every other day. Alternates with 75 mcg strength  . levothyroxine (SYNTHROID, LEVOTHROID) 75 MCG tablet Take 75 mcg by mouth every other day. Alternates with 50 mcg strength  . metFORMIN (GLUCOPHAGE) 500 MG tablet Take 500 mg by mouth at bedtime.   . Multiple Vitamin (MULTIVITAMIN WITH MINERALS) TABS Take 1 tablet by mouth every other day.   . Multiple Vitamins-Minerals (PRESERVISION AREDS 2 PO) Take 1 capsule by mouth 2 (two) times daily.  . Omega-3 Fatty Acids (FISH OIL) 1200 MG CAPS Take 1,200 mg by mouth every other day.   . simvastatin (ZOCOR) 20 MG tablet Take 20 mg by mouth at bedtime.   . [DISCONTINUED] ibuprofen (ADVIL,MOTRIN) 200 MG tablet Take 600 mg by mouth every morning.      Allergies:   Patient has no known allergies.   Social History   Socioeconomic History  . Marital status: Married    Spouse name: Not on file  . Number of children: Not on file  . Years of education: Not on file  . Highest education level: Not on file  Occupational History  . Not on file  Tobacco Use  . Smoking status: Never Smoker  . Smokeless tobacco: Never Used  Substance and Sexual Activity  . Alcohol use: Yes    Alcohol/week: 1.0 - 3.0 standard drinks    Types: 1 - 3 Glasses of wine per week    Comment: x2 weekly   . Drug use: No  . Sexual activity: Not on file  Other Topics Concern  . Not on file  Social History Narrative   Married   1 daughter - deceased   Software engineer - retired; works a little   Social Determinants of Radio broadcast assistant Strain:   . Difficulty of Paying Living Expenses:   Food Insecurity:   . Worried About Charity fundraiser in the Last Year:   . Arboriculturist in the Last Year:   Transportation Needs:   . Film/video editor (Medical):   Marland Kitchen Lack of Transportation (Non-Medical):   Physical Activity:   . Days of Exercise per Week:   . Minutes of Exercise per Session:   Stress:   . Feeling of Stress :   Social Connections:   .  Frequency of Communication with Friends and Family:   . Frequency of Social Gatherings with Friends and Family:   . Attends Religious Services:   . Active Member of Clubs or Organizations:   . Attends Archivist Meetings:   Marland Kitchen Marital Status:      Family History:  The patient's family history includes Atrial fibrillation in his mother; Cancer in his father; Heart attack in his father; Heart disease in his mother; Heart failure in his mother; Hypertension in his father; Mitral valve prolapse in his  mother.  ROS:   Please see the history of present illness.  All other systems are reviewed and otherwise negative.    EKGs/Labs/Other Studies Reviewed:    Studies reviewed are outlined and summarized above. Reports included below if pertinent.  RCardiac Catheterization11/14/19 Mean RA 6, RV 33/8, mean PA 21, PCWP 9, CO 12.5, CI 4.8 1. Normal filling pressures.  2. Normal pulmonary artery pressure 3. High cardiac output.  4. No evidence for shunt lesion on shunt run.  Cardiac MRI 05/02/18 IMPRESSION: 1. Moderate RV enlargement with areas of mid and basal hypokinesis but overall EF preserved 51% with no RVH 2. Normal LV size and function EF 72% 3. No obvious ASD/PFO/VSD PV;s not adequately seen Patient should have f/u right heart cath with shunt run and consider cardiac CT to further r/o anomalous pulmonary veins or other central vascular shunts Can consider also returning for RV dysplasia sequences and Qp/Qs by MRI  Echo 02/07/18 EF 55-60, no RWMA, Gr 1 DD, mild AI, mod RVE, mod RAE, PASP 31  Nuclear stress test7/19/19 Normal resting and stress perfusion. No ischemia or infarction EF 52%  Echo 12/24/16 - Left ventricle: The cavity size was normal. Wall thickness was normal. Systolic function was normal. The estimated ejection fraction was in the range of 60% to 65%. Doppler parameters are consistent with abnormal left ventricular relaxation (grade  1 diastolic dysfunction). - Aortic valve: There was trivial regurgitation. - Right ventricle: The cavity size was mildly dilated. - Right atrium: The atrium was mildly dilated.  Echo 06/02/16 EF 55 and 60, normal wall motion, trivial AI, moderate RVE, mildly reduced RVSF, moderate to severe RAE     EKG:  EKG is ordered today, personally reviewed, demonstrating atrial flutter 113bpm, no acute STT changes  Recent Labs: No results found for requested labs within last 8760 hours.  Recent Lipid Panel No results found for: CHOL, TRIG, HDL, CHOLHDL, VLDL, LDLCALC, LDLDIRECT  PHYSICAL EXAM:    VS:  BP 120/80   Pulse (!) 113   Ht 6\' 7"  (2.007 m)   Wt 281 lb (127.5 kg)   SpO2 94%   BMI 31.66 kg/m   BMI: Body mass index is 31.66 kg/m.  GEN: Well nourished, well developed WM, in no acute distress HEENT: normocephalic, atraumatic Neck: no JVD, carotid bruits, or masses Cardiac: tachycardic, irregular, no murmurs, rubs, or gallops, no edema  Respiratory:  clear to auscultation bilaterally, normal work of breathing GI: soft, nontender, nondistended, + BS MS: no deformity or atrophy Skin: warm and dry, no rash, chronic appearing violaceous discoloration of lower extremities which patient states is chronic Neuro:  Alert and Oriented x 3, Strength and sensation are intact, follows commands, hard of hearing in left ear, steady gait Psych: euthymic mood, full affect  Wt Readings from Last 3 Encounters:  11/17/19 281 lb (127.5 kg)  02/07/19 278 lb 12.8 oz (126.5 kg)  08/05/18 289 lb 12.8 oz (131.5 kg)     ASSESSMENT & PLAN:   1. New onset atrial flutter - elevated HR confirmed to be new diagnosis of atrial flutter. Reviewed pathology and risks of stroke with patient. CHADSVASC is 4 for heart failure, diabetes and age. Fortunately he is generally asymptomatic (no change in prior chronic dyspnea). Will start Eliquis 5mg  BID. Risks/benefits discussed. Also advised limiting NSAIDs and  bleeding precautions. There is no acute indication for TEE/DCCV at this time. I reviewed with Dr. Lovena Le who recommended starting with Toprol XL 25mg  - he suggests patient may  be a candidate for atrial flutter ablation if this persists. The patient will follow-up in 1 week with our general team first for reassessment of heart rate and symptoms. Will check stat labs to ensure stable from 03/2019 and also update CXR since atrial flutter be implicated in pulmonary issues. Will obtain BMET, Mg, CBC, and thyroid function. I have signed out to APP on call who will likely receive stat lab call this evening to be aware of reason for labs. We gave patient Eliquis samples and sent in rx. I did tell the patient that if he found that his HR did not improve with the metroprolol over the weekend to call us at which time I would suggest increasing if needed.  2. Chronic right sided heart failure - volume status appears stable. The patient reports he was told that this was related to sleep apnea that went untreated for many years. He is now compliant with CPAP. Of note, his cardiac MRI in 05/2018 did comment "Can consider also returning for RV dysplasia sequences and Qp/Qs by MRI." When he returns for his follow-up next week, if HR better controlled, would suggest discussing whether this would be helpful with Dr. Harrington Challenger. We will go ahead and update his echocardiogram. 3. Mild aortic insufficiency - will reassess by echo as above. 4. Hypothyroidism - check thyroid function with labs.  Disposition: F/u with Richardson Dopp PA-C in 1 week.  Medication Adjustments/Labs and Tests Ordered: Current medicines are reviewed at length with the patient today.  Concerns regarding medicines are outlined above. Medication changes, Labs and Tests ordered today are summarized above and listed in the Patient Instructions accessible in Encounters.   Signed, Charlie Pitter, PA-C  11/17/2019 4:34 PM    [facility] [place], [place], [place]  [address] Phone: [phone]; Fax: [phone]

## 2019-11-17 NOTE — Telephone Encounter (Signed)
Spoke with the pt and he reports that he had his first heart rate check earlier in the week at a BP machine at [facility]... it showed his HR was 108. He says he is not sure about his BP reading but he thinks it was "normal".   He says the next day he had a dental appt and his HR was 102 then he had to return a few days later for a crown and his HR was then 105.   Pt says he is asymptomatic... he denies dizziness, palpitations, and no chest pain but some Sob only with exertion.   I made him an appt this afternoon with Melina Copa PA... I have asked that he please call back if he develops any problems or any new symptoms prior to his appt. Pt has agreed.

## 2019-11-17 NOTE — Telephone Encounter (Signed)
Please call patient about his symptoms.  He may need appointment today for evaluation.  Richardson Dopp, PA-C 07:41 11/17/2019

## 2019-11-17 NOTE — Patient Instructions (Addendum)
Medication Instructions:  Your physician has recommended you make the following change in your medication:   1. START: metoprolol succinate (Toprol-XL) 25 mg tablet: Take 1 tablet by mouth once a day  2. START: eliquis 5 mg tablet: Take 1 tablet by mouth twice a day  3. STOP: ibuprofen  Patients taking blood thinners should generally stay away from medicines like ibuprofen, Advil, Motrin, naproxen, and Aleve due to risk of stomach bleeding. You may take Tylenol as directed or talk to your primary doctor about alternatives.  If you notice any bleeding such as blood in stool, black tarry stools, blood in urine, nosebleeds or any other unusual bleeding, call your doctor immediately. It is not normal to have this kind of bleeding while on a blood thinner and usually indicates there is an underlying problem with one of your body systems that needs to be checked out.   *If you need a refill on your cardiac medications before your next appointment, please call your pharmacy*   Lab Work: TODAY: STAT BMET, STAT Magnesium, STAT CBC, TSH, T4  If you have labs (blood work) drawn today and your tests are completely normal, you will receive your results only by: Marland Kitchen MyChart Message (if you have MyChart) OR . A paper copy in the mail If you have any lab test that is abnormal or we need to change your treatment, we will call you to review the results.   Testing/Procedures: Your physician has requested that you have an echocardiogram on 12/01/19 at 7:30 AM. Echocardiography is a painless test that uses sound waves to create images of your heart. It provides your doctor with information about the size and shape of your heart and how well your heart's chambers and valves are working. This procedure takes approximately one hour. There are no restrictions for this procedure.  A chest x-ray takes a picture of the organs and structures inside the chest, including the heart, lungs, and blood vessels. This test can  show several things, including, whether the heart is enlarges; whether fluid is building up in the lungs; and whether pacemaker / defibrillator leads are still in place.  Chest X-ray Instructions:    1. You may have this done at the [facility], located in the        [place] on the 1st floor.    2. You do no have to have an appointment.    3. [place], [place] [address]        [phone]        Monday - Friday  8:00 am - 5:00 pm  Follow-Up: Follow up with Richardson Dopp, PA on 11/24/19 at 11:15 AM   Other Instructions  None

## 2019-11-18 LAB — TSH: TSH: 3.16 u[IU]/mL (ref 0.450–4.500)

## 2019-11-18 LAB — T4, FREE: Free T4: 1.28 ng/dL (ref 0.82–1.77)

## 2019-11-20 ENCOUNTER — Telehealth: Payer: Self-pay | Admitting: *Deleted

## 2019-11-20 ENCOUNTER — Telehealth: Payer: Self-pay | Admitting: Physician Assistant

## 2019-11-20 DIAGNOSIS — I4892 Unspecified atrial flutter: Secondary | ICD-10-CM

## 2019-11-20 MED ORDER — METOPROLOL SUCCINATE ER 25 MG PO TB24
50.0000 mg | ORAL_TABLET | Freq: Every day | ORAL | 0 refills | Status: DC
Start: 2019-11-20 — End: 2020-02-06

## 2019-11-20 NOTE — Telephone Encounter (Signed)
Called pt re: lab / cxr results.  Pt was advising that his HR was still elevated after starting Toprol XL 25 qd. Advised pt I would send Melina Copa, PA-C a message and get her recommendations. In the meantime, pt called back and left a message for [employer], PA-C in the same regards.   Pt has been made aware that per [place], to increase his Toprol to 50 mg qd, keep his f/u appt with Richardson Dopp, PA-C on Friday, 5/14 and that a referral for Dr. Lovena Le will be put in [place] will call to arrange that appt. Pt very grateful for the call back, and asked that I disregard the message to Richardson Dopp, PA-C.

## 2019-11-20 NOTE — Telephone Encounter (Signed)
-----   Message from Charlie Pitter, [place] sent at 11/20/2019 12:15 PM EDT ----- Thank you! If his BP is running OK I.e. greater than 0000000 systolic, increase metoprolol to 50mg  daily for now. Dr. Lovena Le had said he would be happy to see the patient in follow-up to discuss options like ablation - can we get the ball rolling on this? He has f/u with Nicki Reaper on Friday in the meantime. Will cc to him to make him aware the EP appt will be pending when he sees him. Thank you!

## 2019-11-20 NOTE — Telephone Encounter (Signed)
    I went in pt's chart to see which Matthew Robinson called him

## 2019-11-20 NOTE — Telephone Encounter (Signed)
Patient c/o Palpitations:  High priority if patient c/o lightheadedness, shortness of breath, or chest pain  1) How long have you had palpitations/irregular HR/ Afib? Are you having the symptoms now? Past Month  2) Are you currently experiencing lightheadedness, SOB or CP? No   3) Do you have a history of afib (atrial fibrillation) or irregular heart rhythm? No   4) Have you checked your BP or HR? (document readings if available): always over 100 all 3 days  5) Are you experiencing any other symptoms? No   Matthew Robinson is calling stating Matthew Robinson advised him to call first thing this Monday to discuss his recently diagnosed palpitations. He states he called this morning and was told he would be called back in regards to it, but I was unable to find any documentation of this call. Matthew Robinson states the medications mprescribed aren't helping and his HR when checked is always over 100. Please advise

## 2019-11-20 NOTE — Progress Notes (Signed)
Thank you! If his BP is running OK I.e. greater than 0000000 systolic, increase metoprolol to 50mg  daily for now. Dr. Lovena Le had said he would be happy to see the patient in follow-up to discuss options like ablation - can we get the ball rolling on this? He has f/u with Nicki Reaper on Friday in the meantime. Will cc to him to make him aware the EP appt will be pending when he sees him. Thank you!

## 2019-11-22 ENCOUNTER — Ambulatory Visit (INDEPENDENT_AMBULATORY_CARE_PROVIDER_SITE_OTHER): Payer: Medicare Other | Admitting: Internal Medicine

## 2019-11-22 ENCOUNTER — Encounter: Payer: Self-pay | Admitting: Internal Medicine

## 2019-11-22 ENCOUNTER — Other Ambulatory Visit: Payer: Self-pay

## 2019-11-22 ENCOUNTER — Telehealth: Payer: Self-pay

## 2019-11-22 DIAGNOSIS — I4892 Unspecified atrial flutter: Secondary | ICD-10-CM | POA: Diagnosis not present

## 2019-11-22 NOTE — Progress Notes (Signed)
HPI Matthew Robinson is referred today by Melina Copa for evaluation of atrial flutter. He is a pleasant 74 yo man with mild pulmonary HTN who has a h/o palpitations in the past. He developed a rapid HR and was found to have atrial flutter with a RVR. He has been treated with Eliquis and toprol. His rates have not been well controlled. He has minimal palpitations and feels ok with rest but when he walks or does anything at all strenuous he will get sob. He has evidence of venous insufficiency and mild leg swelling. He has not had syncope.   No Known Allergies   Current Outpatient Medications  Medication Sig Dispense Refill  . apixaban (ELIQUIS) 5 MG TABS tablet Take 1 tablet (5 mg total) by mouth 2 (two) times daily. 60 tablet 5  . Cholecalciferol (VITAMIN D) 2000 UNITS tablet Take 2,000 Units by mouth every other day.     . famotidine (PEPCID) 20 MG tablet Take 20 mg by mouth daily as needed for heartburn.     . furosemide (LASIX) 40 MG tablet Take 1 tablet (40 mg total) by mouth daily as needed for edema. 90 tablet 3  . levothyroxine (SYNTHROID, LEVOTHROID) 50 MCG tablet Take 50 mcg by mouth every other day. Alternates with 75 mcg strength    . levothyroxine (SYNTHROID, LEVOTHROID) 75 MCG tablet Take 75 mcg by mouth every other day. Alternates with 50 mcg strength    . metFORMIN (GLUCOPHAGE) 500 MG tablet Take 500 mg by mouth at bedtime.   2  . metoprolol succinate (TOPROL XL) 25 MG 24 hr tablet Take 2 tablets (50 mg total) by mouth daily. 60 tablet 0  . Multiple Vitamin (MULTIVITAMIN WITH MINERALS) TABS Take 1 tablet by mouth every other day.     . Multiple Vitamins-Minerals (PRESERVISION AREDS 2 PO) Take 1 capsule by mouth 2 (two) times daily.    . Omega-3 Fatty Acids (FISH OIL) 1200 MG CAPS Take 1,200 mg by mouth every other day.     . simvastatin (ZOCOR) 20 MG tablet Take 20 mg by mouth at bedtime.      No current facility-administered medications for this visit.     Past Medical  History:  Diagnosis Date  . Chronic right-sided CHF (congestive heart failure) ([place])    Echo 11/17: EF 55 and 60, normal wall motion, trivial AI, moderate RVE, mildly reduced RVSF, moderate to severe RAE // Echo 6/18: EF 60-65, Gr 1 DD, trivial AI, mild RVE, mild RAE.  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 Glucose intolerance (impaired glucose tolerance)   . Hyperlipemia   . Melanoma of skin ([place])    left shoulder  . Mild aortic insufficiency   . Mitral valve prolapse    hx of - seen years ago by Dr Enid Derry in [facility], [place] - not seen on recent echoes  . Sleep apnea    cpap 16    ROS:   All systems reviewed and negative except as noted in the HPI.   Past Surgical History:  Procedure Laterality Date  . CATARACT EXTRACTION Left   . COLONOSCOPY     polyps in past  . COLONOSCOPY WITH PROPOFOL N/A 08/14/2014   Procedure: COLONOSCOPY WITH PROPOFOL;  Surgeon: Garlan Fair, MD;  Location: [facility];  Service: Endoscopy;  Laterality: N/A;  . MELANOMA EXCISION    . MENISCUS REPAIR Left 1970  .  RETINAL DETACHMENT SURGERY    . RIGHT HEART CATH N/A 05/26/2018   Procedure: RIGHT HEART CATH;  Surgeon: Larey Dresser, MD;  Location: [place] [facility];  Service: Cardiovascular;  Laterality: N/A;  . SPINE SURGERY     4'14 lumbar 3,4,5 fusion([place])  . TONSILLECTOMY       Family History  Problem Relation Age of Onset  . Heart failure Mother   . Heart disease Mother   . Atrial fibrillation Mother   . Mitral valve prolapse Mother   . Hypertension Father   . Heart attack Father        died from MI during CA surgery  . Cancer Father      Social History   Socioeconomic History  . Marital status: Married    Spouse name: Not on file  . Number of children: Not on file  . Years of education: Not on file  . Highest education level: Not on file  Occupational History  . Not on file  Tobacco Use  . Smoking  status: Never Smoker  . Smokeless tobacco: Never Used  Substance and Sexual Activity  . Alcohol use: Yes    Alcohol/week: 1.0 - 3.0 standard drinks    Types: 1 - 3 Glasses of wine per week    Comment: x2 weekly   . Drug use: No  . Sexual activity: Not on file  Other Topics Concern  . Not on file  Social History Narrative   Married   1 daughter - deceased   Software engineer - retired; works a little   Social Determinants of Radio broadcast assistant Strain:   . Difficulty of Paying Living Expenses:   Food Insecurity:   . Worried About Charity fundraiser in the Last Year:   . Arboriculturist in the Last Year:   Transportation Needs:   . Film/video editor (Medical):   Marland Kitchen Lack of Transportation (Non-Medical):   Physical Activity:   . Days of Exercise per Week:   . Minutes of Exercise per Session:   Stress:   . Feeling of Stress :   Social Connections:   . Frequency of Communication with Friends and Family:   . Frequency of Social Gatherings with Friends and Family:   . Attends Religious Services:   . Active Member of Clubs or Organizations:   . Attends Archivist Meetings:   Marland Kitchen Marital Status:   Intimate Partner Violence:   . Fear of Current or Ex-Partner:   . Emotionally Abused:   Marland Kitchen Physically Abused:   . Sexually Abused:      BP 118/76   Pulse 74   Ht 6\' 6"  (1.981 m)   Wt 283 lb (128.4 kg)   SpO2 98%   BMI 32.70 kg/m   Physical Exam:  Well appearing NAD HEENT: Unremarkable Neck:  No JVD, no thyromegally Lymphatics:  No adenopathy Back:  No CVA tenderness Lungs:  Clear with no wheezes HEART:  IRegular tachy rhythm, no murmurs, no rubs, no clicks Abd:  soft, positive bowel sounds, no organomegally, no rebound, no guarding Ext:  2 plus pulses, no edema, no cyanosis, no clubbing Skin:  No rashes no nodules Neuro:  CN II through XII intact, motor grossly intact  EKG - atrial flutter with a rvr  Assess/Plan: 1. Atrial flutter - I have discussed  the indications/risks/benefits/goals/expectations of EP study and catheter ablation of atrial flutter and he wishes to proceed.  2. Coags - he  has been started on Eliquis and will need 3 weeks before undergoing ablation. 3. HTN -his bp is well controlled. We will follow.  Mikle Bosworth.D.

## 2019-11-22 NOTE — Telephone Encounter (Signed)
Left detailed message  Advised ablation scheduled for June 3 at 730 am  covid test scheduled  Mailing completed instruction letter

## 2019-11-22 NOTE — Patient Instructions (Addendum)
Medication Instructions:  Your physician recommends that you continue on your current medications as directed. Please refer to the Current Medication list given to you today.  Labwork: None ordered.  Testing/Procedures: Your physician has recommended that you have an ablation. Catheter ablation is a medical procedure used to treat some cardiac arrhythmias (irregular heartbeats). During catheter ablation, a long, thin, flexible tube is put into a blood vessel in your groin (upper thigh), or neck. This tube is called an ablation catheter. It is then guided to your heart through the blood vessel. Radio frequency waves destroy small areas of heart tissue where abnormal heartbeats may cause an arrhythmia to start. Please see the instruction sheet given to you today.  Follow-Up:  SEE INSTRUCTION LETTER  Any Other Special Instructions Will Be Listed Below (If Applicable).  If you need a refill on your cardiac medications before your next appointment, please call your pharmacy.    Cardiac Ablation, Care After This sheet gives you information about how to care for yourself after your procedure. Your health care provider may also give you more specific instructions. If you have problems or questions, contact your health care provider. What can I expect after the procedure? After the procedure, it is common to have:  Bruising around your puncture site.  Tenderness around your puncture site.  Skipped heartbeats.  Tiredness (fatigue). Follow these instructions at home: Puncture site care   Follow instructions from your health care provider about how to take care of your puncture site. Make sure you: ? Wash your hands with soap and water before you change your bandage (dressing). If soap and water are not available, use hand sanitizer. ? Change your dressing as told by your health care provider. ? Leave stitches (sutures), skin glue, or adhesive strips in place. These skin closures may need to  stay in place for up to 2 weeks. If adhesive strip edges start to loosen and curl up, you may trim the loose edges. Do not remove adhesive strips completely unless your health care provider tells you to do that.  Check your puncture site every day for signs of infection. Check for: ? Redness, swelling, or pain. ? Fluid or blood. If your puncture site starts to bleed, lie down on your back, apply firm pressure to the area, and contact your health care provider. ? Warmth. ? Pus or a bad smell. Driving  Ask your health care provider when it is safe for you to drive again after the procedure.  Do not drive or use heavy machinery while taking prescription pain medicine.  Do not drive for 24 hours if you were given a medicine to help you relax (sedative) during your procedure. Activity  Avoid activities that take a lot of effort for at least 3 days after your procedure.  Do not lift anything that is heavier than 10 lb (4.5 kg), or the limit that you are told, until your health care provider says that it is safe.  Return to your normal activities as told by your health care provider. Ask your health care provider what activities are safe for you. General instructions  Take over-the-counter and prescription medicines only as told by your health care provider.  Do not use any products that contain nicotine or tobacco, such as cigarettes and e-cigarettes. If you need help quitting, ask your health care provider.  Do not take baths, swim, or use a hot tub until your health care provider approves.  Do not drink alcohol for 24 hours  after your procedure.  Keep all follow-up visits as told by your health care provider. This is important. Contact a health care provider if:  You have redness, mild swelling, or pain around your puncture site.  You have fluid or blood coming from your puncture site that stops after applying firm pressure to the area.  Your puncture site feels warm to the  touch.  You have pus or a bad smell coming from your puncture site.  You have a fever.  You have chest pain or discomfort that spreads to your neck, jaw, or arm.  You are sweating a lot.  You feel nauseous.  You have a fast or irregular heartbeat.  You have shortness of breath.  You are dizzy or light-headed and feel the need to lie down.  You have pain or numbness in the arm or leg closest to your puncture site. Get help right away if:  Your puncture site suddenly swells.  Your puncture site is bleeding and the bleeding does not stop after applying firm pressure to the area. These symptoms may represent a serious problem that is an emergency. Do not wait to see if the symptoms will go away. Get medical help right away. Call your local emergency services (911 in the [place] Do not drive yourself to the hospital. Summary  After the procedure, it is normal to have bruising and tenderness at the puncture site in your groin, neck, or forearm.  Check your puncture site every day for signs of infection.  Get help right away if your puncture site is bleeding and the bleeding does not stop after applying firm pressure to the area. This is a medical emergency. This information is not intended to replace advice given to you by your health care provider. Make sure you discuss any questions you have with your health care provider. Document Revised: 06/11/2017 Document Reviewed: 10/08/2016 Elsevier Patient Education  .

## 2019-11-22 NOTE — H&P (View-Only) (Signed)
HPI Matthew Robinson is referred today by Melina Copa for evaluation of atrial flutter. He is a pleasant 74 yo man with mild pulmonary HTN who has a h/o palpitations in the past. He developed a rapid HR and was found to have atrial flutter with a RVR. He has been treated with Eliquis and toprol. His rates have not been well controlled. He has minimal palpitations and feels ok with rest but when he walks or does anything at all strenuous he will get sob. He has evidence of venous insufficiency and mild leg swelling. He has not had syncope.   No Known Allergies   Current Outpatient Medications  Medication Sig Dispense Refill  . apixaban (ELIQUIS) 5 MG TABS tablet Take 1 tablet (5 mg total) by mouth 2 (two) times daily. 60 tablet 5  . Cholecalciferol (VITAMIN D) 2000 UNITS tablet Take 2,000 Units by mouth every other day.     . famotidine (PEPCID) 20 MG tablet Take 20 mg by mouth daily as needed for heartburn.     . furosemide (LASIX) 40 MG tablet Take 1 tablet (40 mg total) by mouth daily as needed for edema. 90 tablet 3  . levothyroxine (SYNTHROID, LEVOTHROID) 50 MCG tablet Take 50 mcg by mouth every other day. Alternates with 75 mcg strength    . levothyroxine (SYNTHROID, LEVOTHROID) 75 MCG tablet Take 75 mcg by mouth every other day. Alternates with 50 mcg strength    . metFORMIN (GLUCOPHAGE) 500 MG tablet Take 500 mg by mouth at bedtime.   2  . metoprolol succinate (TOPROL XL) 25 MG 24 hr tablet Take 2 tablets (50 mg total) by mouth daily. 60 tablet 0  . Multiple Vitamin (MULTIVITAMIN WITH MINERALS) TABS Take 1 tablet by mouth every other day.     . Multiple Vitamins-Minerals (PRESERVISION AREDS 2 PO) Take 1 capsule by mouth 2 (two) times daily.    . Omega-3 Fatty Acids (FISH OIL) 1200 MG CAPS Take 1,200 mg by mouth every other day.     . simvastatin (ZOCOR) 20 MG tablet Take 20 mg by mouth at bedtime.      No current facility-administered medications for this visit.     Past Medical  History:  Diagnosis Date  . Chronic right-sided CHF (congestive heart failure) ([place])    Echo 11/17: EF 55 and 60, normal wall motion, trivial AI, moderate RVE, mildly reduced RVSF, moderate to severe RAE // Echo 6/18: EF 60-65, Gr 1 DD, trivial AI, mild RVE, mild RAE.  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 Glucose intolerance (impaired glucose tolerance)   . Hyperlipemia   . Melanoma of skin ([place])    left shoulder  . Mild aortic insufficiency   . Mitral valve prolapse    hx of - seen years ago by Dr Enid Derry in [facility], [place] - not seen on recent echoes  . Sleep apnea    cpap 16    ROS:   All systems reviewed and negative except as noted in the HPI.   Past Surgical History:  Procedure Laterality Date  . CATARACT EXTRACTION Left   . COLONOSCOPY     polyps in past  . COLONOSCOPY WITH PROPOFOL N/A 08/14/2014   Procedure: COLONOSCOPY WITH PROPOFOL;  Surgeon: Garlan Fair, MD;  Location: [facility];  Service: Endoscopy;  Laterality: N/A;  . MELANOMA EXCISION    . MENISCUS REPAIR Left 1970  .  RETINAL DETACHMENT SURGERY    . RIGHT HEART CATH N/A 05/26/2018   Procedure: RIGHT HEART CATH;  Surgeon: Larey Dresser, MD;  Location: [facility];  Service: Cardiovascular;  Laterality: N/A;  . SPINE SURGERY     4'14 lumbar 3,4,5 fusion([place])  . TONSILLECTOMY       Family History  Problem Relation Age of Onset  . Heart failure Mother   . Heart disease Mother   . Atrial fibrillation Mother   . Mitral valve prolapse Mother   . Hypertension Father   . Heart attack Father        died from MI during CA surgery  . Cancer Father      Social History   Socioeconomic History  . Marital status: Married    Spouse name: Not on file  . Number of children: Not on file  . Years of education: Not on file  . Highest education level: Not on file  Occupational History  . Not on file  Tobacco Use  . Smoking  status: Never Smoker  . Smokeless tobacco: Never Used  Substance and Sexual Activity  . Alcohol use: Yes    Alcohol/week: 1.0 - 3.0 standard drinks    Types: 1 - 3 Glasses of wine per week    Comment: x2 weekly   . Drug use: No  . Sexual activity: Not on file  Other Topics Concern  . Not on file  Social History Narrative   Married   1 daughter - deceased   Software engineer - retired; works a little   Social Determinants of Radio broadcast assistant Strain:   . Difficulty of Paying Living Expenses:   Food Insecurity:   . Worried About Charity fundraiser in the Last Year:   . Arboriculturist in the Last Year:   Transportation Needs:   . Film/video editor (Medical):   Marland Kitchen Lack of Transportation (Non-Medical):   Physical Activity:   . Days of Exercise per Week:   . Minutes of Exercise per Session:   Stress:   . Feeling of Stress :   Social Connections:   . Frequency of Communication with Friends and Family:   . Frequency of Social Gatherings with Friends and Family:   . Attends Religious Services:   . Active Member of Clubs or Organizations:   . Attends Archivist Meetings:   Marland Kitchen Marital Status:   Intimate Partner Violence:   . Fear of Current or Ex-Partner:   . Emotionally Abused:   Marland Kitchen Physically Abused:   . Sexually Abused:      BP 118/76   Pulse 74   Ht 6\' 6"  (1.981 m)   Wt 283 lb (128.4 kg)   SpO2 98%   BMI 32.70 kg/m   Physical Exam:  Well appearing NAD HEENT: Unremarkable Neck:  No JVD, no thyromegally Lymphatics:  No adenopathy Back:  No CVA tenderness Lungs:  Clear with no wheezes HEART:  IRegular tachy rhythm, no murmurs, no rubs, no clicks Abd:  soft, positive bowel sounds, no organomegally, no rebound, no guarding Ext:  2 plus pulses, no edema, no cyanosis, no clubbing Skin:  No rashes no nodules Neuro:  CN II through XII intact, motor grossly intact  EKG - atrial flutter with a rvr  Assess/Plan: 1. Atrial flutter - I have discussed  the indications/risks/benefits/goals/expectations of EP study and catheter ablation of atrial flutter and he wishes to proceed.  2. Coags - he  has been started on Eliquis and will need 3 weeks before undergoing ablation. 3. HTN -his bp is well controlled. We will follow.  Mikle Bosworth.D.

## 2019-11-24 ENCOUNTER — Ambulatory Visit: Payer: 59 | Admitting: Physician Assistant

## 2019-12-01 ENCOUNTER — Ambulatory Visit (HOSPITAL_COMMUNITY): Payer: Medicare Other | Attending: Physician Assistant

## 2019-12-01 ENCOUNTER — Other Ambulatory Visit: Payer: Self-pay

## 2019-12-01 DIAGNOSIS — I351 Nonrheumatic aortic (valve) insufficiency: Secondary | ICD-10-CM | POA: Diagnosis not present

## 2019-12-01 DIAGNOSIS — I50812 Chronic right heart failure: Secondary | ICD-10-CM | POA: Insufficient documentation

## 2019-12-01 DIAGNOSIS — E039 Hypothyroidism, unspecified: Secondary | ICD-10-CM | POA: Insufficient documentation

## 2019-12-01 DIAGNOSIS — E785 Hyperlipidemia, unspecified: Secondary | ICD-10-CM | POA: Diagnosis not present

## 2019-12-01 DIAGNOSIS — I4892 Unspecified atrial flutter: Secondary | ICD-10-CM | POA: Insufficient documentation

## 2019-12-05 ENCOUNTER — Telehealth: Payer: Self-pay | Admitting: Internal Medicine

## 2019-12-05 NOTE — Telephone Encounter (Signed)
Patient is returning call to discuss results from echocardiogram completed on 12/01/19. Please call to discuss.

## 2019-12-05 NOTE — Telephone Encounter (Signed)
The patient has been notified of the Echo result and verbalized understanding.  All questions (if any) were answered. Frederik Schmidt, RN 12/05/2019 10:12 AM

## 2019-12-12 ENCOUNTER — Other Ambulatory Visit (HOSPITAL_COMMUNITY)
Admission: RE | Admit: 2019-12-12 | Discharge: 2019-12-12 | Disposition: A | Discharge status: Home / Self Care | Payer: Medicare Other | Source: Ambulatory Visit | Attending: Internal Medicine | Admitting: Internal Medicine

## 2019-12-12 DIAGNOSIS — Z01812 Encounter for preprocedural laboratory examination: Secondary | ICD-10-CM | POA: Diagnosis not present

## 2019-12-12 DIAGNOSIS — Z20822 Contact with and (suspected) exposure to covid-19: Secondary | ICD-10-CM | POA: Diagnosis not present

## 2019-12-12 LAB — SARS CORONAVIRUS 2 (TAT 6-24 HRS): SARS Coronavirus 2: NEGATIVE

## 2019-12-13 NOTE — Anesthesia Preprocedure Evaluation (Addendum)
Anesthesia Evaluation  Patient identified by MRN, date of birth, ID band Patient awake    Reviewed: Allergy & Precautions, H&P , NPO status , Patient's Chart, lab work & pertinent test results  Airway Mallampati: III  TM Distance: >3 FB Neck ROM: Full    Dental  (+) Dental Advisory Given, Teeth Intact   Pulmonary sleep apnea ,    Pulmonary exam normal breath sounds clear to auscultation       Cardiovascular hypertension, Pt. on medications and Pt. on home beta blockers +CHF  Normal cardiovascular exam Rhythm:Regular Rate:Normal  Echo 12/01/2019 1. Normal LV function; mildly dilated aortic root; trace AI; mild RAE and RVE.  2. Left ventricular ejection fraction, by estimation, is 60 to 65%. The left ventricle has normal function. The left ventricle has no regional wall motion abnormalities. Left ventricular diastolic parameters are indeterminate.  3. Right ventricular systolic function is normal. The right ventricular size is mildly enlarged. Tricuspid regurgitation signal is inadequate for assessing PA pressure.  4. Right atrial size was mildly dilated.  5. The mitral valve is normal in structure. Trivial mitral valve regurgitation. No evidence of mitral stenosis.  6. The aortic valve is tricuspid. Aortic valve regurgitation is trivial. No aortic stenosis is present.  7. Aortic dilatation noted. 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  8. The inferior vena cava is normal in size with greater than 50% respiratory variability, suggesting right atrial pressure of 3 mmHg.    Neuro/Psych negative neurological ROS  negative psych ROS   GI/Hepatic Neg liver ROS, GERD  ,  Endo/Other  Hypothyroidism   Renal/GU negative Renal ROS     Musculoskeletal   Abdominal (+) + obese,   Peds  Hematology negative hematology ROS (+)   Anesthesia Other Findings See surgeon's H&P    Reproductive/Obstetrics negative OB ROS                            Anesthesia Physical  Anesthesia Plan  ASA: III  Anesthesia Plan: General   Post-op Pain Management:    Induction: Intravenous  PONV Risk Score and Plan: 2 and Ondansetron, Dexamethasone and Treatment may vary due to age or medical condition  Airway Management Planned: Oral ETT  Additional Equipment: None  Intra-op Plan:   Post-operative Plan: Extubation in OR  Informed Consent: I have reviewed the patients History and Physical, chart, labs and discussed the procedure including the risks, benefits and alternatives for the proposed anesthesia with the patient or authorized representative who has indicated his/her understanding and acceptance.     Dental advisory given  Plan Discussed with: CRNA  Anesthesia Plan Comments:        Anesthesia Quick Evaluation

## 2019-12-14 ENCOUNTER — Ambulatory Visit (HOSPITAL_COMMUNITY): Payer: Medicare Other | Admitting: Critical Care Medicine

## 2019-12-14 ENCOUNTER — Other Ambulatory Visit: Payer: Self-pay

## 2019-12-14 ENCOUNTER — Ambulatory Visit (HOSPITAL_COMMUNITY)
Admission: RE | Admit: 2019-12-14 | Discharge: 2019-12-14 | Disposition: A | Discharge status: Home / Self Care | Payer: Medicare Other | Source: Ambulatory Visit | Attending: Internal Medicine | Admitting: Internal Medicine

## 2019-12-14 ENCOUNTER — Ambulatory Visit (HOSPITAL_BASED_OUTPATIENT_CLINIC_OR_DEPARTMENT_OTHER): Payer: Medicare Other

## 2019-12-14 ENCOUNTER — Encounter (HOSPITAL_COMMUNITY)
Admission: RE | Disposition: A | Discharge status: Home / Self Care | Payer: Medicare Other | Source: Ambulatory Visit | Attending: Internal Medicine

## 2019-12-14 ENCOUNTER — Encounter (HOSPITAL_COMMUNITY): Payer: Self-pay | Admitting: Internal Medicine

## 2019-12-14 DIAGNOSIS — Z7901 Long term (current) use of anticoagulants: Secondary | ICD-10-CM | POA: Insufficient documentation

## 2019-12-14 DIAGNOSIS — I4892 Unspecified atrial flutter: Secondary | ICD-10-CM | POA: Diagnosis not present

## 2019-12-14 DIAGNOSIS — Z8249 Family history of ischemic heart disease and other diseases of the circulatory system: Secondary | ICD-10-CM | POA: Diagnosis not present

## 2019-12-14 DIAGNOSIS — I272 Pulmonary hypertension, unspecified: Secondary | ICD-10-CM | POA: Diagnosis not present

## 2019-12-14 DIAGNOSIS — I509 Heart failure, unspecified: Secondary | ICD-10-CM | POA: Insufficient documentation

## 2019-12-14 DIAGNOSIS — G4730 Sleep apnea, unspecified: Secondary | ICD-10-CM | POA: Diagnosis not present

## 2019-12-14 DIAGNOSIS — I110 Hypertensive heart disease with heart failure: Secondary | ICD-10-CM | POA: Insufficient documentation

## 2019-12-14 DIAGNOSIS — I341 Nonrheumatic mitral (valve) prolapse: Secondary | ICD-10-CM | POA: Insufficient documentation

## 2019-12-14 DIAGNOSIS — E785 Hyperlipidemia, unspecified: Secondary | ICD-10-CM | POA: Diagnosis not present

## 2019-12-14 DIAGNOSIS — Z79899 Other long term (current) drug therapy: Secondary | ICD-10-CM | POA: Insufficient documentation

## 2019-12-14 DIAGNOSIS — E669 Obesity, unspecified: Secondary | ICD-10-CM | POA: Diagnosis not present

## 2019-12-14 DIAGNOSIS — I483 Typical atrial flutter: Secondary | ICD-10-CM | POA: Diagnosis not present

## 2019-12-14 DIAGNOSIS — G4733 Obstructive sleep apnea (adult) (pediatric): Secondary | ICD-10-CM | POA: Diagnosis not present

## 2019-12-14 DIAGNOSIS — I313 Pericardial effusion (noninflammatory): Secondary | ICD-10-CM

## 2019-12-14 DIAGNOSIS — E039 Hypothyroidism, unspecified: Secondary | ICD-10-CM | POA: Diagnosis not present

## 2019-12-14 DIAGNOSIS — Z85820 Personal history of malignant melanoma of skin: Secondary | ICD-10-CM | POA: Diagnosis not present

## 2019-12-14 DIAGNOSIS — Z6831 Body mass index (BMI) 31.0-31.9, adult: Secondary | ICD-10-CM | POA: Insufficient documentation

## 2019-12-14 DIAGNOSIS — K219 Gastro-esophageal reflux disease without esophagitis: Secondary | ICD-10-CM | POA: Insufficient documentation

## 2019-12-14 DIAGNOSIS — Z79890 Hormone replacement therapy (postmenopausal): Secondary | ICD-10-CM | POA: Diagnosis not present

## 2019-12-14 DIAGNOSIS — Z7984 Long term (current) use of oral hypoglycemic drugs: Secondary | ICD-10-CM | POA: Insufficient documentation

## 2019-12-14 HISTORY — PX: A-FLUTTER ABLATION: EP1230

## 2019-12-14 LAB — ECHOCARDIOGRAM LIMITED
Height: 78 in
Weight: 4384 oz

## 2019-12-14 LAB — GLUCOSE, CAPILLARY: Glucose-Capillary: 128 mg/dL — ABNORMAL HIGH (ref 70–99)

## 2019-12-14 SURGERY — A-FLUTTER ABLATION
Anesthesia: General

## 2019-12-14 MED ORDER — PROPOFOL 10 MG/ML IV BOLUS
INTRAVENOUS | Status: DC | PRN
  Administered 2019-12-14: 120 mg via INTRAVENOUS

## 2019-12-14 MED ORDER — BUPIVACAINE HCL (PF) 0.25 % IJ SOLN
INTRAMUSCULAR | Status: DC | PRN
  Administered 2019-12-14: 30 mL

## 2019-12-14 MED ORDER — BUPIVACAINE HCL (PF) 0.25 % IJ SOLN
INTRAMUSCULAR | Status: AC
  Filled 2019-12-14: qty 30

## 2019-12-14 MED ORDER — ACETAMINOPHEN 325 MG PO TABS
650.0000 mg | ORAL_TABLET | ORAL | Status: DC | PRN
  Filled 2019-12-14: qty 2

## 2019-12-14 MED ORDER — ROCURONIUM BROMIDE 10 MG/ML (PF) SYRINGE
PREFILLED_SYRINGE | INTRAVENOUS | Status: DC | PRN
  Administered 2019-12-14: 100 mg via INTRAVENOUS

## 2019-12-14 MED ORDER — SUGAMMADEX SODIUM 200 MG/2ML IV SOLN
INTRAVENOUS | Status: DC | PRN
  Administered 2019-12-14: 250 mg via INTRAVENOUS

## 2019-12-14 MED ORDER — DEXAMETHASONE SODIUM PHOSPHATE 10 MG/ML IJ SOLN
INTRAMUSCULAR | Status: DC | PRN
  Administered 2019-12-14: 4 mg via INTRAVENOUS

## 2019-12-14 MED ORDER — PHENYLEPHRINE HCL-NACL 10-0.9 MG/250ML-% IV SOLN
INTRAVENOUS | Status: DC | PRN
Start: 2019-12-14 — End: 2019-12-14
  Administered 2019-12-14: 25 ug/min via INTRAVENOUS

## 2019-12-14 MED ORDER — HEPARIN SODIUM (PORCINE) 1000 UNIT/ML IJ SOLN
INTRAMUSCULAR | Status: AC
  Filled 2019-12-14: qty 1

## 2019-12-14 MED ORDER — PHENYLEPHRINE 40 MCG/ML (10ML) SYRINGE FOR IV PUSH (FOR BLOOD PRESSURE SUPPORT)
PREFILLED_SYRINGE | INTRAVENOUS | Status: DC | PRN
  Administered 2019-12-14 (×2): 80 ug via INTRAVENOUS
  Administered 2019-12-14: 160 ug via INTRAVENOUS
  Administered 2019-12-14: 120 ug via INTRAVENOUS
  Administered 2019-12-14: 80 ug via INTRAVENOUS

## 2019-12-14 MED ORDER — LIDOCAINE 2% (20 MG/ML) 5 ML SYRINGE
INTRAMUSCULAR | Status: DC | PRN
  Administered 2019-12-14: 80 mg via INTRAVENOUS

## 2019-12-14 MED ORDER — MIDAZOLAM HCL 5 MG/5ML IJ SOLN
INTRAMUSCULAR | Status: DC | PRN
  Administered 2019-12-14 (×2): 1 mg via INTRAVENOUS

## 2019-12-14 MED ORDER — SODIUM CHLORIDE 0.9 % IV SOLN: 250.0000 mL | INTRAVENOUS | Status: DC | PRN

## 2019-12-14 MED ORDER — SODIUM CHLORIDE 0.9% FLUSH: 3.0000 mL | Freq: Two times a day (BID) | INTRAVENOUS | Status: DC

## 2019-12-14 MED ORDER — HEPARIN (PORCINE) IN NACL 1000-0.9 UT/500ML-% IV SOLN
INTRAVENOUS | Status: AC
  Filled 2019-12-14: qty 500

## 2019-12-14 MED ORDER — SODIUM CHLORIDE 0.9% FLUSH: 3.0000 mL | INTRAVENOUS | Status: DC | PRN

## 2019-12-14 MED ORDER — ONDANSETRON HCL 4 MG/2ML IJ SOLN: 4.0000 mg | Freq: Four times a day (QID) | INTRAMUSCULAR | Status: DC | PRN

## 2019-12-14 MED ORDER — HEPARIN SODIUM (PORCINE) 1000 UNIT/ML IJ SOLN
INTRAMUSCULAR | Status: DC | PRN
  Administered 2019-12-14: 1000 [IU] via INTRAVENOUS

## 2019-12-14 MED ORDER — HEPARIN (PORCINE) IN NACL 1000-0.9 UT/500ML-% IV SOLN
INTRAVENOUS | Status: DC | PRN
  Administered 2019-12-14: 500 mL

## 2019-12-14 MED ORDER — FENTANYL CITRATE (PF) 250 MCG/5ML IJ SOLN
INTRAMUSCULAR | Status: DC | PRN
  Administered 2019-12-14 (×2): 50 ug via INTRAVENOUS

## 2019-12-14 MED ORDER — ONDANSETRON HCL 4 MG/2ML IJ SOLN
INTRAMUSCULAR | Status: DC | PRN
  Administered 2019-12-14: 4 mg via INTRAVENOUS

## 2019-12-14 MED ORDER — SODIUM CHLORIDE 0.9 % IV SOLN: INTRAVENOUS | Status: DC

## 2019-12-14 SURGICAL SUPPLY — 14 items
BAG SNAP BAND KOVER 36X36 (MISCELLANEOUS) ×1 IMPLANT
BLANKET WARM UNDERBOD FULL ACC (MISCELLANEOUS) ×1 IMPLANT
CATH JOSEPH QUAD ALLRED 6F REP (CATHETERS) ×1 IMPLANT
CATH SMTCH THERMOCOOL SF FJ (CATHETERS) ×1 IMPLANT
CATH WEBSTER BI DIR CS D-F CRV (CATHETERS) ×1 IMPLANT
PACK EP LATEX FREE (CUSTOM PROCEDURE TRAY) ×2
PACK EP LF (CUSTOM PROCEDURE TRAY) ×1 IMPLANT
PAD PRO RADIOLUCENT 2001M-C (PAD) ×3 IMPLANT
PATCH CARTO3 (PAD) ×1 IMPLANT
SHEATH PINNACLE 6F 10CM (SHEATH) ×1 IMPLANT
SHEATH PINNACLE 7F 10CM (SHEATH) ×1 IMPLANT
SHEATH PINNACLE 8F 10CM (SHEATH) ×1 IMPLANT
SHEATH PROBE COVER 6X72 (BAG) ×1 IMPLANT
TUBING SMART ABLATE COOLFLOW (TUBING) ×1 IMPLANT

## 2019-12-14 NOTE — Interval H&P Note (Signed)
History and Physical Interval Note:  12/14/2019 7:40 AM  [name]  has presented today for surgery, with the diagnosis of aflutter.  The various methods of treatment have been discussed with the patient and family. After consideration of risks, benefits and other options for treatment, the patient has consented to  Procedure(s): A-FLUTTER ABLATION (N/A) as a surgical intervention.  The patient's history has been reviewed, patient examined, no change in status, stable for surgery.  I have reviewed the patient's chart and labs.  Questions were answered to the patient's satisfaction.     Matthew Robinson

## 2019-12-14 NOTE — Progress Notes (Signed)
Right femoral venous sheaths X 2 removed, manual pressure held X 15 minutes.  Post sheath instructions reviewed with pt.  Site soft no sign of hematoma or swelling.

## 2019-12-14 NOTE — Transfer of Care (Signed)
Immediate Anesthesia Transfer of Care Note  Patient: Matthew Robinson  Procedure(s) Performed: A-FLUTTER ABLATION (N/A )  Patient Location: Cath Lab  Anesthesia Type:General  Level of Consciousness: awake and alert   Airway & Oxygen Therapy: Patient Spontanous Breathing and Patient connected to nasal cannula oxygen  Post-op Assessment: Report given to RN and Post -op Vital signs reviewed and stable  Post vital signs: Reviewed and stable  Last Vitals:  Vitals Value Taken Time  BP    Temp    Pulse    Resp    SpO2      Last Pain:  Vitals:   12/14/19 0633  TempSrc:   PainSc: 0-No pain      Patients Stated Pain Goal: 3 ([id number])  Complications: No apparent anesthesia complications

## 2019-12-14 NOTE — Discharge Instructions (Signed)
Post procedure care instructions No driving for 4 days. No lifting over 5 lbs for 1 week. No vigorous or sexual activity for 1 week. You may return to work/your usual activities on 12/21/2019. Keep procedure site clean & dry. If you notice increased pain, swelling, bleeding or pus, call/return!  You may shower, but no soaking baths/hot tubs/pools for 1 week.

## 2019-12-14 NOTE — Progress Notes (Signed)
°  Echocardiogram 2D Echocardiogram has been performed.  Matthew Robinson 12/14/2019, 9:47 AM

## 2019-12-14 NOTE — Anesthesia Procedure Notes (Signed)
Procedure Name: Intubation Date/Time: 12/14/2019 7:43 AM Performed by: Wilburn Cornelia, CRNA Pre-anesthesia Checklist: Patient identified, Emergency Drugs available, Suction available, Patient being monitored and Timeout performed Patient Re-evaluated:Patient Re-evaluated prior to induction Oxygen Delivery Method: Circle system utilized Preoxygenation: Pre-oxygenation with 100% oxygen Induction Type: IV induction Ventilation: Mask ventilation without difficulty Laryngoscope Size: Mac and 4 Grade View: Grade IV Tube type: Oral Tube size: 7.5 mm Number of attempts: 1 Airway Equipment and Method: Stylet Placement Confirmation: ETT inserted through vocal cords under direct vision,  positive ETCO2,  CO2 detector and breath sounds checked- equal and bilateral Secured at: 24 cm Tube secured with: Tape Dental Injury: Teeth and Oropharynx as per pre-operative assessment

## 2019-12-14 NOTE — Progress Notes (Signed)
Patient and wife was given discharge instructions. Both verbalized understanding. 

## 2019-12-15 ENCOUNTER — Telehealth: Payer: Self-pay | Admitting: Internal Medicine

## 2019-12-15 DIAGNOSIS — I5030 Unspecified diastolic (congestive) heart failure: Secondary | ICD-10-CM | POA: Diagnosis not present

## 2019-12-15 DIAGNOSIS — E11621 Type 2 diabetes mellitus with foot ulcer: Secondary | ICD-10-CM | POA: Diagnosis not present

## 2019-12-15 DIAGNOSIS — M1990 Unspecified osteoarthritis, unspecified site: Secondary | ICD-10-CM | POA: Diagnosis not present

## 2019-12-15 DIAGNOSIS — I509 Heart failure, unspecified: Secondary | ICD-10-CM | POA: Diagnosis not present

## 2019-12-15 DIAGNOSIS — E1140 Type 2 diabetes mellitus with diabetic neuropathy, unspecified: Secondary | ICD-10-CM | POA: Diagnosis not present

## 2019-12-15 DIAGNOSIS — N400 Enlarged prostate without lower urinary tract symptoms: Secondary | ICD-10-CM | POA: Diagnosis not present

## 2019-12-15 DIAGNOSIS — E7800 Pure hypercholesterolemia, unspecified: Secondary | ICD-10-CM | POA: Diagnosis not present

## 2019-12-15 DIAGNOSIS — E039 Hypothyroidism, unspecified: Secondary | ICD-10-CM | POA: Diagnosis not present

## 2019-12-15 NOTE — Telephone Encounter (Signed)
Patient had some follow-up questions about his Ablation he had yesterday.

## 2019-12-15 NOTE — Telephone Encounter (Signed)
Call received from Pt.  Pt wanting to know when he can remove his groin dressing and shower.  Advised to remove dressing after 24 hours and then shower.  Advised to be careful of area-do not let water directly hit area.  Advised he does not need to reapply a dressing to area.  Pt thanked nurse for instruction.

## 2019-12-15 NOTE — Anesthesia Postprocedure Evaluation (Signed)
Anesthesia Post Note  Patient: Matthew Robinson  Procedure(s) Performed: A-FLUTTER ABLATION (N/A )     Patient location during evaluation: PACU Anesthesia Type: General Level of consciousness: sedated and patient cooperative Pain management: pain level controlled Vital Signs Assessment: post-procedure vital signs reviewed and stable Respiratory status: spontaneous breathing Cardiovascular status: stable Anesthetic complications: no    Last Vitals:  Vitals:   12/14/19 1505 12/14/19 1600  BP: 103/65 107/69  Pulse: 68 70  Resp: (!) 21 14  Temp:    SpO2: 98% 99%    Last Pain:  Vitals:   12/14/19 1113  TempSrc:   PainSc: 0-No pain                 Nolon Nations

## 2019-12-19 ENCOUNTER — Institutional Professional Consult (permissible substitution): Payer: Medicare Other | Admitting: Internal Medicine

## 2019-12-26 ENCOUNTER — Telehealth: Payer: Self-pay | Admitting: Internal Medicine

## 2019-12-26 NOTE — Telephone Encounter (Signed)
° °  Pt c/o medication issue:  1. Name of Medication:   apixaban (ELIQUIS) 5 MG TABS tablet    2. How are you currently taking this medication (dosage and times per day)?   3. Are you having a reaction (difficulty breathing--STAT)?   4. What is your medication issue? Pt is calling and would like to know for how long he needs to take his blood thinner, he said he will be on vacation 06/25 - 07/03 and would be great if he can stop taking it before going on vacation

## 2019-12-27 NOTE — Telephone Encounter (Signed)
Left detailed message.  Advised ok to stop Eliquis on January 05, 2020 per Dr. Lovena Le   Advised to call office if any further questions.

## 2019-12-28 ENCOUNTER — Telehealth: Payer: Self-pay | Admitting: Internal Medicine

## 2019-12-28 NOTE — Telephone Encounter (Signed)
The patient continues Toprol XL 25 mg every morning  Wants to know if he still needs to take it.  Since ablation his HR stays in low 60s.  Aware I will forward to Dr. Lovena Le for recommendations on whether to continue or stop and then we will call him back.

## 2019-12-28 NOTE — Telephone Encounter (Signed)
New Message   Pt c/o medication issue:  1. Name of Medication: metoprolol succinate (TOPROL XL) 25 MG 24 hr tablet    2. How are you currently taking this medication (dosage and times per day)?   3. Are you having a reaction (difficulty breathing--STAT)?   4. What is your medication issue?Patient is calling to see if he still needs to take this medication

## 2020-01-01 NOTE — Telephone Encounter (Signed)
Patient called to follow up about when to stop his medication. If the patient does not answer please leave a message with a date to stop medication on his answering machine

## 2020-01-02 NOTE — Telephone Encounter (Signed)
Returned call to Pt.  Spoke with wife.  Advised that if Pt is not feeling bad, would prefer Pt continue his medication (toprol XL) until his follow up with Dr. Lovena Le.  (Pt is a retired Software engineer)  Advised if he felt bad, dizzy/fatigue he could wean off, advised not to stop altogether.  Wife states she will advise Pt and he will call if he has any further questions.

## 2020-01-16 DIAGNOSIS — R972 Elevated prostate specific antigen [PSA]: Secondary | ICD-10-CM | POA: Diagnosis not present

## 2020-01-18 ENCOUNTER — Encounter: Payer: Self-pay | Admitting: Internal Medicine

## 2020-01-18 ENCOUNTER — Ambulatory Visit (INDEPENDENT_AMBULATORY_CARE_PROVIDER_SITE_OTHER): Payer: Medicare Other | Admitting: Internal Medicine

## 2020-01-18 ENCOUNTER — Other Ambulatory Visit: Payer: Self-pay

## 2020-01-18 VITALS — BP 126/70 | HR 69 | Ht 78.0 in | Wt 275.0 lb

## 2020-01-18 DIAGNOSIS — I4892 Unspecified atrial flutter: Secondary | ICD-10-CM

## 2020-01-18 DIAGNOSIS — I10 Essential (primary) hypertension: Secondary | ICD-10-CM | POA: Diagnosis not present

## 2020-01-18 NOTE — Patient Instructions (Addendum)
Medication Instructions:  Your physician recommends that you continue on your current medications as directed. Please refer to the Current Medication list given to you today.  Labwork: None ordered.  Testing/Procedures: None ordered.  Follow-Up: Your physician wants you to follow-up in: as needed with Dr. Taylor.      Any Other Special Instructions Will Be Listed Below (If Applicable).  If you need a refill on your cardiac medications before your next appointment, please call your pharmacy.   

## 2020-01-18 NOTE — Progress Notes (Signed)
HPI Matthew Robinson returns today after undergoing Atrial flutter ablation. He has done well in the interim with no chest pain or sob. He denies palpitations.  He stopped his Eliquis several weeks after the ablation. He notes some mild edema.   No Known Allergies   Current Outpatient Medications  Medication Sig Dispense Refill  . Cholecalciferol (VITAMIN D) 2000 UNITS tablet Take 2,000 Units by mouth every other day.     . famotidine (PEPCID) 20 MG tablet Take 20 mg by mouth daily as needed for heartburn.     . furosemide (LASIX) 40 MG tablet Take 1 tablet (40 mg total) by mouth daily as needed for edema. 90 tablet 3  . levothyroxine (SYNTHROID, LEVOTHROID) 50 MCG tablet Take 50 mcg by mouth every other day. Alternates with 75 mcg strength    . levothyroxine (SYNTHROID, LEVOTHROID) 75 MCG tablet Take 75 mcg by mouth every other day. Alternates with 50 mcg strength    . metFORMIN (GLUCOPHAGE) 500 MG tablet Take 500 mg by mouth at bedtime.   2  . metoprolol succinate (TOPROL XL) 25 MG 24 hr tablet Take 2 tablets (50 mg total) by mouth daily. 60 tablet 0  . Multiple Vitamin (MULTIVITAMIN WITH MINERALS) TABS Take 1 tablet by mouth every other day.     . Multiple Vitamins-Minerals (PRESERVISION AREDS 2 PO) Take 1 capsule by mouth 2 (two) times daily.    . Omega-3 Fatty Acids (FISH OIL) 1200 MG CAPS Take 1,200 mg by mouth every other day.     . simvastatin (ZOCOR) 20 MG tablet Take 20 mg by mouth at bedtime.      No current facility-administered medications for this visit.     Past Medical History:  Diagnosis Date  . Chronic right-sided CHF (congestive heart failure) ([place])    Echo 11/17: EF 55 and 60, normal wall motion, trivial AI, moderate RVE, mildly reduced RVSF, moderate to severe RAE // Echo 6/18: EF 60-65, Gr 1 DD, trivial AI, mild RVE, mild RAE.  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 Glucose intolerance (impaired glucose tolerance)    . Hyperlipemia   . Melanoma of skin ([place])    left shoulder  . Mild aortic insufficiency   . Mitral valve prolapse    hx of - seen years ago by Dr Enid Derry in [facility], [place] - not seen on recent echoes  . Sleep apnea    cpap 16    ROS:   All systems reviewed and negative except as noted in the HPI.   Past Surgical History:  Procedure Laterality Date  . A-FLUTTER ABLATION N/A 12/14/2019   Procedure: A-FLUTTER ABLATION;  Surgeon: Evans Lance, MD;  Location: [place] [facility];  Service: Cardiovascular;  Laterality: N/A;  . CATARACT EXTRACTION Left   . COLONOSCOPY     polyps in past  . COLONOSCOPY WITH PROPOFOL N/A 08/14/2014   Procedure: COLONOSCOPY WITH PROPOFOL;  Surgeon: Garlan Fair, MD;  [facility]: [facility];  Service: Endoscopy;  Laterality: N/A;  . MELANOMA EXCISION    . MENISCUS REPAIR Left 1970  . RETINAL DETACHMENT SURGERY    . RIGHT HEART CATH N/A 05/26/2018   Procedure: RIGHT HEART CATH;  Surgeon: Larey Dresser, MD;  Location: [place] [facility];  Service: Cardiovascular;  Laterality: N/A;  . SPINE SURGERY     4'14 lumbar 3,4,5 fusion([place])  . TONSILLECTOMY  Family History  Problem Relation Age of Onset  . Heart failure Mother   . Heart disease Mother   . Atrial fibrillation Mother   . Mitral valve prolapse Mother   . Hypertension Father   . Heart attack Father        died from MI during CA surgery  . Cancer Father      Social History   Socioeconomic History  . Marital status: Married    Spouse name: Not on file  . Number of children: Not on file  . Years of education: Not on file  . Highest education level: Not on file  Occupational History  . Not on file  Tobacco Use  . Smoking status: Never Smoker  . Smokeless tobacco: Never Used  Vaping Use  . Vaping Use: Never used  Substance and Sexual Activity  . Alcohol use: Yes    Alcohol/week: 1.0 - 3.0 standard drink    Types: 1 - 3 Glasses of wine per week     Comment: x2 weekly   . Drug use: No  . Sexual activity: Not on file  Other Topics Concern  . Not on file  Social History Narrative   Married   1 daughter - deceased   Software engineer - retired; works a little   Social Determinants of Radio broadcast assistant Strain:   . Difficulty of Paying Living Expenses:   Food Insecurity:   . Worried About Charity fundraiser in the Last Year:   . Arboriculturist in the Last Year:   Transportation Needs:   . Film/video editor (Medical):   Marland Kitchen Lack of Transportation (Non-Medical):   Physical Activity:   . Days of Exercise per Week:   . Minutes of Exercise per Session:   Stress:   . Feeling of Stress :   Social Connections:   . Frequency of Communication with Friends and Family:   . Frequency of Social Gatherings with Friends and Family:   . Attends Religious Services:   . Active Member of Clubs or Organizations:   . Attends Archivist Meetings:   Marland Kitchen Marital Status:   Intimate Partner Violence:   . Fear of Current or Ex-Partner:   . Emotionally Abused:   Marland Kitchen Physically Abused:   . Sexually Abused:      BP 126/70   Pulse 69   Ht 6\' 6"  (1.981 m)   Wt 275 lb (124.7 kg)   SpO2 95%   BMI 31.78 kg/m   Physical Exam:  Well appearing NAD HEENT: Unremarkable Neck:  No JVD, no thyromegally Lymphatics:  No adenopathy Back:  No CVA tenderness Lungs:  Clear HEART:  Regular rate rhythm, no murmurs, no rubs, no clicks Abd:  soft, positive bowel sounds, no organomegally, no rebound, no guarding Ext:  2 plus pulses, no edema, no cyanosis, no clubbing Skin:  No rashes no nodules Neuro:  CN II through XII intact, motor grossly intact  EKG - nsr   Assess/Plan: 1. Atrial flutter - he is maintaining NSR. He will continue his beta blocker. 2. HTN - he will stay on his beta blocker rather than switching to a different blood thinner.  Mikle [email or web address].D.

## 2020-01-23 DIAGNOSIS — L821 Other seborrheic keratosis: Secondary | ICD-10-CM | POA: Diagnosis not present

## 2020-01-23 DIAGNOSIS — L570 Actinic keratosis: Secondary | ICD-10-CM | POA: Diagnosis not present

## 2020-01-25 DIAGNOSIS — I5030 Unspecified diastolic (congestive) heart failure: Secondary | ICD-10-CM | POA: Diagnosis not present

## 2020-01-25 DIAGNOSIS — I509 Heart failure, unspecified: Secondary | ICD-10-CM | POA: Diagnosis not present

## 2020-01-25 DIAGNOSIS — E11621 Type 2 diabetes mellitus with foot ulcer: Secondary | ICD-10-CM | POA: Diagnosis not present

## 2020-01-25 DIAGNOSIS — M1990 Unspecified osteoarthritis, unspecified site: Secondary | ICD-10-CM | POA: Diagnosis not present

## 2020-01-25 DIAGNOSIS — E7800 Pure hypercholesterolemia, unspecified: Secondary | ICD-10-CM | POA: Diagnosis not present

## 2020-01-25 DIAGNOSIS — E039 Hypothyroidism, unspecified: Secondary | ICD-10-CM | POA: Diagnosis not present

## 2020-01-25 DIAGNOSIS — E1140 Type 2 diabetes mellitus with diabetic neuropathy, unspecified: Secondary | ICD-10-CM | POA: Diagnosis not present

## 2020-01-25 DIAGNOSIS — N400 Enlarged prostate without lower urinary tract symptoms: Secondary | ICD-10-CM | POA: Diagnosis not present

## 2020-02-06 ENCOUNTER — Telehealth: Payer: Self-pay | Admitting: Internal Medicine

## 2020-02-06 MED ORDER — METOPROLOL SUCCINATE ER 25 MG PO TB24
ORAL_TABLET | ORAL | 0 refills | Status: DC
Start: 2020-02-06 — End: 2020-06-17

## 2020-02-06 NOTE — Telephone Encounter (Addendum)
Spoke with the pt and he reports that he has been feeling fairly well since his 12/14/19 ablation expect for a few short dizzy spells but not related to palpitations or any other symptoms.   Today after he lunch he had some discomfort in his mid- epigastric area he felt could have been indigestion but then he has been having a fluctuating heart rate.... 75,77,95,120,101... he does not feel his rhythm is abnormal. He denies dizziness, shortness of breath, no other chest pain, and no palpitations.   I asked him to continue to monitor but to relax and continue to be sure-[he is hydrating. He has not had any added caffeine outside his 2 cups of coffee this morning. No new OTC meds.    Pt says that he has decreased his Toprol from 50 mg daily to 25 mg daily but it has not been noted in his chart... after talking with Jenny/ Dr. Lovena Le... pt will take an extra 25 mg and continue to monitor.. he may take the extra for elevated HR in the 100-120 range but to let us know how he is doing. Pt verbalized understanding and agreed.

## 2020-02-06 NOTE — Telephone Encounter (Signed)
STAT if HR is under 50 or over 120 (normal HR is 60-100 beats per minute)  1) What is your heart rate? 120  2) Do you have a log of your heart rate readings (document readings)? 100-120 past 20 times it was taken   3) Do you have any other symptoms? No

## 2020-02-08 ENCOUNTER — Other Ambulatory Visit: Payer: Self-pay | Admitting: Physician Assistant

## 2020-03-26 DIAGNOSIS — D075 Carcinoma in situ of prostate: Secondary | ICD-10-CM | POA: Diagnosis not present

## 2020-03-26 DIAGNOSIS — R972 Elevated prostate specific antigen [PSA]: Secondary | ICD-10-CM | POA: Diagnosis not present

## 2020-03-26 DIAGNOSIS — C61 Malignant neoplasm of prostate: Secondary | ICD-10-CM | POA: Diagnosis not present

## 2020-03-29 DIAGNOSIS — Z23 Encounter for immunization: Secondary | ICD-10-CM | POA: Diagnosis not present

## 2020-04-02 DIAGNOSIS — M519 Unspecified thoracic, thoracolumbar and lumbosacral intervertebral disc disorder: Secondary | ICD-10-CM | POA: Diagnosis not present

## 2020-04-02 DIAGNOSIS — E7800 Pure hypercholesterolemia, unspecified: Secondary | ICD-10-CM | POA: Diagnosis not present

## 2020-04-02 DIAGNOSIS — I872 Venous insufficiency (chronic) (peripheral): Secondary | ICD-10-CM | POA: Diagnosis not present

## 2020-04-02 DIAGNOSIS — Z7984 Long term (current) use of oral hypoglycemic drugs: Secondary | ICD-10-CM | POA: Diagnosis not present

## 2020-04-02 DIAGNOSIS — C439 Malignant melanoma of skin, unspecified: Secondary | ICD-10-CM | POA: Diagnosis not present

## 2020-04-02 DIAGNOSIS — E1140 Type 2 diabetes mellitus with diabetic neuropathy, unspecified: Secondary | ICD-10-CM | POA: Diagnosis not present

## 2020-04-02 DIAGNOSIS — I4892 Unspecified atrial flutter: Secondary | ICD-10-CM | POA: Diagnosis not present

## 2020-04-02 DIAGNOSIS — D696 Thrombocytopenia, unspecified: Secondary | ICD-10-CM | POA: Diagnosis not present

## 2020-04-02 DIAGNOSIS — E039 Hypothyroidism, unspecified: Secondary | ICD-10-CM | POA: Diagnosis not present

## 2020-04-02 DIAGNOSIS — G4730 Sleep apnea, unspecified: Secondary | ICD-10-CM | POA: Diagnosis not present

## 2020-04-02 DIAGNOSIS — Z0000 Encounter for general adult medical examination without abnormal findings: Secondary | ICD-10-CM | POA: Diagnosis not present

## 2020-04-02 DIAGNOSIS — Z1389 Encounter for screening for other disorder: Secondary | ICD-10-CM | POA: Diagnosis not present

## 2020-04-02 DIAGNOSIS — R972 Elevated prostate specific antigen [PSA]: Secondary | ICD-10-CM | POA: Diagnosis not present

## 2020-04-02 DIAGNOSIS — I5030 Unspecified diastolic (congestive) heart failure: Secondary | ICD-10-CM | POA: Diagnosis not present

## 2020-04-08 DIAGNOSIS — C61 Malignant neoplasm of prostate: Secondary | ICD-10-CM | POA: Diagnosis not present

## 2020-04-09 ENCOUNTER — Other Ambulatory Visit (HOSPITAL_COMMUNITY): Payer: Self-pay | Admitting: Urology

## 2020-04-09 ENCOUNTER — Other Ambulatory Visit: Payer: Self-pay | Admitting: Urology

## 2020-04-09 DIAGNOSIS — C61 Malignant neoplasm of prostate: Secondary | ICD-10-CM

## 2020-04-12 DIAGNOSIS — Z23 Encounter for immunization: Secondary | ICD-10-CM | POA: Diagnosis not present

## 2020-04-16 ENCOUNTER — Other Ambulatory Visit (HOSPITAL_COMMUNITY): Payer: Self-pay | Admitting: Internal Medicine

## 2020-04-17 DIAGNOSIS — Z961 Presence of intraocular lens: Secondary | ICD-10-CM | POA: Diagnosis not present

## 2020-04-17 DIAGNOSIS — H353131 Nonexudative age-related macular degeneration, bilateral, early dry stage: Secondary | ICD-10-CM | POA: Diagnosis not present

## 2020-04-17 DIAGNOSIS — E119 Type 2 diabetes mellitus without complications: Secondary | ICD-10-CM | POA: Diagnosis not present

## 2020-04-17 DIAGNOSIS — H31092 Other chorioretinal scars, left eye: Secondary | ICD-10-CM | POA: Diagnosis not present

## 2020-04-18 ENCOUNTER — Ambulatory Visit (HOSPITAL_COMMUNITY)
Admission: RE | Admit: 2020-04-18 | Discharge: 2020-04-18 | Disposition: A | Discharge status: Home / Self Care | Payer: Medicare Other | Source: Ambulatory Visit | Attending: Urology | Admitting: Urology

## 2020-04-18 ENCOUNTER — Encounter (HOSPITAL_COMMUNITY)
Admission: RE | Admit: 2020-04-18 | Discharge: 2020-04-18 | Disposition: A | Discharge status: Home / Self Care | Payer: Medicare Other | Source: Ambulatory Visit | Attending: Urology | Admitting: Urology

## 2020-04-18 ENCOUNTER — Other Ambulatory Visit: Payer: Self-pay

## 2020-04-18 DIAGNOSIS — C61 Malignant neoplasm of prostate: Secondary | ICD-10-CM | POA: Insufficient documentation

## 2020-04-18 DIAGNOSIS — Z8546 Personal history of malignant neoplasm of prostate: Secondary | ICD-10-CM | POA: Diagnosis not present

## 2020-04-18 DIAGNOSIS — M25552 Pain in left hip: Secondary | ICD-10-CM | POA: Diagnosis not present

## 2020-04-18 DIAGNOSIS — M25561 Pain in right knee: Secondary | ICD-10-CM | POA: Diagnosis not present

## 2020-04-18 DIAGNOSIS — M25551 Pain in right hip: Secondary | ICD-10-CM | POA: Diagnosis not present

## 2020-04-18 MED ORDER — TECHNETIUM TC 99M MEDRONATE IV KIT
22.0000 | PACK | Freq: Once | INTRAVENOUS | Status: AC
  Administered 2020-04-18: 22 via INTRAVENOUS

## 2020-04-22 DIAGNOSIS — C61 Malignant neoplasm of prostate: Secondary | ICD-10-CM | POA: Diagnosis not present

## 2020-04-24 ENCOUNTER — Telehealth: Payer: Self-pay | Admitting: Radiation Oncology

## 2020-04-24 NOTE — Telephone Encounter (Signed)
Received voicemail message from patient explaining he has been referred by Dr. Tresa Moore to Dr. Tammi Klippel reference prostate cancer. Patient questioned when his consultation appointment will be. Phoned patient back. Explained his in person consultation is scheduled for November 5th. Explained he is scheduled to see the nurse at 1330 that day then the doctor at 1400. Patient verbalized understanding. Patient states, "if someone cancels I would like to take their (consult) spot." Explained this RN will pass along this information to the scheduling staff.

## 2020-05-16 NOTE — Progress Notes (Addendum)
GU Location of Tumor / Histology: prostatic adenocarcinoma  If Prostate Cancer, Gleason Score is (4 + 4) and PSA is (18). Prostate volume: 42 g    Biopsies of prostate (if applicable) revealed:   Past/Anticipated interventions by urology, if any: prostate biopsy, CT abd/pelvis (negative), referral for consideration of radiation therapy  Past/Anticipated interventions by medical oncology, if any: no  Weight changes, if any: denies  Bowel/Bladder complaints, if any: IPSS 2. SHIM 19. Denies dysuria or hematuria. Reports nocturia x 1-2. Denies urinary leakage or incontinence. Sits to void due to imbalance from hearing loss in left ear. Denies any bowel complaints.   Nausea/Vomiting, if any: denies  Pain issues, if any:  Reports aching knees. Reports intermittent back pain. Reports right hip pain most mornings upon waking that resolves with ambulation.   SAFETY ISSUES:  Prior radiation? denies  Pacemaker/ICD? denies  Possible current pregnancy? no, male patient  Is the patient on methotrexate? denies  Current Complaints / other details:  74 year old male.

## 2020-05-17 ENCOUNTER — Ambulatory Visit
Admission: RE | Admit: 2020-05-17 | Discharge: 2020-05-17 | Disposition: A | Discharge status: Home / Self Care | Payer: Medicare Other | Source: Ambulatory Visit | Attending: Radiation Oncology | Admitting: Radiation Oncology

## 2020-05-17 ENCOUNTER — Encounter: Payer: Self-pay | Admitting: Radiation Oncology

## 2020-05-17 ENCOUNTER — Encounter: Payer: Self-pay | Admitting: Medical Oncology

## 2020-05-17 ENCOUNTER — Other Ambulatory Visit: Payer: Self-pay

## 2020-05-17 VITALS — BP 130/69 | HR 59 | Temp 98.2°F | Resp 20 | Ht 78.0 in | Wt 283.8 lb

## 2020-05-17 DIAGNOSIS — C61 Malignant neoplasm of prostate: Secondary | ICD-10-CM

## 2020-05-17 DIAGNOSIS — R972 Elevated prostate specific antigen [PSA]: Secondary | ICD-10-CM | POA: Diagnosis not present

## 2020-05-17 DIAGNOSIS — Z803 Family history of malignant neoplasm of breast: Secondary | ICD-10-CM | POA: Diagnosis not present

## 2020-05-17 HISTORY — DX: Malignant neoplasm of prostate: C61

## 2020-05-17 NOTE — Progress Notes (Signed)
Introduced myself to patient and his wife as the prostate nurse navigator and discussed my role. He has chosen ADT and radiation as treatment. He has not been scheduled for ADT but will help to coordinate this with [facility]. No barriers to care identified at this time. I gave him my business card and asked him to call me with questions or concerns. He voiced understanding.

## 2020-05-17 NOTE — Progress Notes (Signed)
Radiation Oncology         [phone] ________________________________  Initial outpatient Consultation  Name: MYRL LAZARUS MRN: [id number]  Date: 05/17/2020  DOB: 20-Jul-1945  KG:URKYHC, Denton Ar, MD  Alexis Frock, MD   REFERRING PHYSICIAN: Alexis Frock, MD  DIAGNOSIS: 74 y.o. gentleman with Stage T1c adenocarcinoma of the prostate with Gleason score of 4+4, and PSA of 18.    ICD-10-CM   1. Malignant neoplasm of prostate ([place])  C61     HISTORY OF PRESENT ILLNESS: RAKESH DUTKO is a 74 y.o. male with a diagnosis of prostate cancer. He has been followed for elevated PSA since at least 2014 with a prior biopsy performed by Dr. Gaynelle Arabian in 04/2013 that was benign.  Upon Dr. Arlyn Leak retirement, his care was transferred to Dr. Tresa Moore and a surveillance prostate MRI on 09/2016 remained without any evidence for macroscopic disease.  His PSA had remained relatively stable and DRE had also remained benign.  His PSA began to rise more rapidly between 2020 and 2021 as follows: 09/2017 - 10.7 09/2018 - 10.6 10/2019 - 15.8 01/16/2020 - 18 , DRE benign at follow-up on 01/23/2020  The patient proceeded to repeat transrectal ultrasound with 12 biopsies of the prostate on 03/26/2020.  The prostate volume measured 42 cc.  Out of 12 core biopsies, 3 were positive.  The maximum Gleason score was 4+4, and this was seen in the left mid. Additionally, Gleason 3+4 was seen in the right apex, and a small focus of Gleason 3+3 in the left apex.  He underwent staging studies on 04/18/2020 with a CT A/P and bone scan which were both negative for metastatic disease.  The patient reviewed the biopsy results with his urologist and he has kindly been referred today for discussion of potential radiation treatment options.   PREVIOUS RADIATION THERAPY: No  PAST MEDICAL HISTORY:  Past Medical History:  Diagnosis Date  . Chronic right-sided CHF (congestive heart failure) ([place])    Echo 11/17: EF 55 and 60,  normal wall motion, trivial AI, moderate RVE, mildly reduced RVSF, moderate to severe RAE // Echo 6/18: EF 60-65, Gr 1 DD, trivial AI, mild RVE, mild RAE.  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 Glucose intolerance (impaired glucose tolerance)   . Hyperlipemia   . Melanoma of skin ([place])    melanoma left shoulder. MOHS x 3.   . Mild aortic insufficiency   . Mitral valve prolapse    hx of - seen years ago by Dr Enid Derry in [facility], [place] - not seen on recent echoes  . Prostate cancer ([place])   . Sleep apnea    cpap 16      PAST SURGICAL HISTORY: Past Surgical History:  Procedure Laterality Date  . A-FLUTTER ABLATION N/A 12/14/2019   Procedure: A-FLUTTER ABLATION;  Surgeon: Evans Lance, MD;  Location: [place] [facility];  Service: Cardiovascular;  Laterality: N/A;  . CATARACT EXTRACTION Left   . COLONOSCOPY     polyps in past  . COLONOSCOPY WITH PROPOFOL N/A 08/14/2014   Procedure: COLONOSCOPY WITH PROPOFOL;  Surgeon: Garlan Fair, MD;  [facility];  Service: Endoscopy;  Laterality: N/A;  . MELANOMA EXCISION    . MENISCUS REPAIR Left 1970  . RETINAL DETACHMENT SURGERY    . RIGHT HEART CATH N/A 05/26/2018   Procedure: RIGHT HEART CATH;  Surgeon: Larey Dresser, MD;  Location: [place]  [facility];  Service: Cardiovascular;  Laterality: N/A;  . SPINE SURGERY     4'14 lumbar 3,4,5 fusion([place])  . TONSILLECTOMY      FAMILY HISTORY:  Family History  Problem Relation Age of Onset  . Heart failure Mother   . Heart disease Mother   . Atrial fibrillation Mother   . Mitral valve prolapse Mother   . Breast cancer Mother   . Hypertension Father   . Heart attack Father        died from MI during CA surgery  . Cancer Father   . Lymphoma Father   . Cancer Maternal Grandfather   . Prostate cancer Neg Hx   . Colon cancer Neg Hx   . Pancreatic cancer Neg Hx     SOCIAL HISTORY:  Social History    Socioeconomic History  . Marital status: Married    Spouse name: Not on file  . Number of children: Not on file  . Years of education: Not on file  . Highest education level: Not on file  Occupational History  . Not on file  Tobacco Use  . Smoking status: Never Smoker  . Smokeless tobacco: Never Used  Vaping Use  . Vaping Use: Never used  Substance and Sexual Activity  . Alcohol use: Yes    Alcohol/week: 1.0 - 3.0 standard drink    Types: 1 - 3 Glasses of wine per week    Comment: x2 weekly   . Drug use: No  . Sexual activity: Yes  Other Topics Concern  . Not on file  Social History Narrative   Married   1 daughter - deceased   Software engineer - retired; works a little   Social Determinants of Radio broadcast assistant Strain:   . Difficulty of Paying Living Expenses: Not on file  Food Insecurity:   . Worried About Charity fundraiser in the Last Year: Not on file  . Ran Out of Food in the Last Year: Not on file  Transportation Needs:   . Lack of Transportation (Medical): Not on file  . Lack of Transportation (Non-Medical): Not on file  Physical Activity:   . Days of Exercise per Week: Not on file  . Minutes of Exercise per Session: Not on file  Stress:   . Feeling of Stress : Not on file  Social Connections:   . Frequency of Communication with Friends and Family: Not on file  . Frequency of Social Gatherings with Friends and Family: Not on file  . Attends Religious Services: Not on file  . Active Member of Clubs or Organizations: Not on file  . Attends Archivist Meetings: Not on file  . Marital Status: Not on file  Intimate Partner Violence:   . Fear of Current or Ex-Partner: Not on file  . Emotionally Abused: Not on file  . Physically Abused: Not on file  . Sexually Abused: Not on file    ALLERGIES: Patient has no known allergies.  MEDICATIONS:  Current Outpatient Medications  Medication Sig Dispense Refill  . Cholecalciferol (VITAMIN D) 2000  UNITS tablet Take 2,000 Units by mouth every other day.     . furosemide (LASIX) 40 MG tablet TAKE 1 TABLET BY MOUTH ONCE DAILY AS NEEDED FOR EDEMA 90 tablet 2  . levothyroxine (SYNTHROID, LEVOTHROID) 50 MCG tablet Take 50 mcg by mouth every other day. Alternates with 75 mcg strength    . levothyroxine (SYNTHROID, LEVOTHROID) 75 MCG tablet Take 75 mcg by  mouth every other day. Alternates with 50 mcg strength    . metFORMIN (GLUCOPHAGE) 500 MG tablet Take 500 mg by mouth at bedtime.   2  . metoprolol succinate (TOPROL XL) 25 MG 24 hr tablet Take 25 mg daily but may take an extra 25 mg for heart rate 100-120 bpm range. Please call the office if this happens regularly. 60 tablet 0  . Multiple Vitamin (MULTIVITAMIN WITH MINERALS) TABS Take 1 tablet by mouth every other day.     . Multiple Vitamins-Minerals (PRESERVISION AREDS 2 PO) Take 1 capsule by mouth 2 (two) times daily.    . simvastatin (ZOCOR) 20 MG tablet Take 20 mg by mouth at bedtime.     Marland Kitchen amoxicillin (AMOXIL) 500 MG capsule SMARTSIG:4 Capsule(s) By Mouth (Patient not taking: Reported on 05/17/2020)    . famotidine (PEPCID) 20 MG tablet Take 20 mg by mouth daily as needed for heartburn.  (Patient not taking: Reported on 05/17/2020)    . predniSONE (DELTASONE) 10 MG tablet Take 10 mg by mouth daily. (Patient not taking: Reported on 05/17/2020)     No current facility-administered medications for this encounter.    REVIEW OF SYSTEMS:  On review of systems, the patient reports that he is doing well overall. He denies any chest pain, shortness of breath, cough, fevers, chills, night sweats, unintended weight changes. He denies any bowel disturbances, and denies abdominal pain, nausea or vomiting. He denies any new musculoskeletal or joint aches or pains. His IPSS was 2, indicating mild urinary symptoms. His SHIM was 19, indicating he has mild erectile dysfunction. A complete review of systems is obtained and is otherwise negative.    PHYSICAL  EXAM:  Wt Readings from Last 3 Encounters:  05/17/20 283 lb 12.8 oz (128.7 kg)  01/18/20 275 lb (124.7 kg)  12/14/19 274 lb (124.3 kg)   Temp Readings from Last 3 Encounters:  05/17/20 98.2 F (36.8 C)  12/14/19 98 F (36.7 C)  05/26/18 98.1 F (36.7 C) (Oral)   BP Readings from Last 3 Encounters:  05/17/20 130/69  01/18/20 126/70  12/14/19 107/69   Pulse Readings from Last 3 Encounters:  05/17/20 (!) 59  01/18/20 69  12/14/19 70   Pain Assessment Pain Score: 0-No pain/10  In general this is a well appearing Caucasian male in no acute distress. He is alert and oriented x4 and appropriate throughout the examination. HEENT reveals that the patient is normocephalic, atraumatic. EOMs are intact. PERRLA. Skin is intact without any evidence of gross lesions. Cardiopulmonary assessment is negative for acute distress and he exhibits normal effort. The abdomen is soft, non tender, non distended. Lower extremities are negative for pretibial pitting edema, deep calf tenderness, cyanosis or clubbing.   KPS = 100  100 - Normal; no complaints; no evidence of disease. 90   - Able to carry on normal activity; minor signs or symptoms of disease. 80   - Normal activity with effort; some signs or symptoms of disease. 10   - Cares for self; unable to carry on normal activity or to do active work. 60   - Requires occasional assistance, but is able to care for most of his personal needs. 50   - Requires considerable assistance and frequent medical care. 27   - Disabled; requires special care and assistance. 52   - Severely disabled; hospital admission is indicated although death not imminent. 84   - Very sick; hospital admission necessary; active supportive treatment necessary. 10   -  Moribund; fatal processes progressing rapidly. 0     - Dead  Karnofsky DA, Abelmann [place], Craver LS and Burchenal [facility] [phone]) The use of the nitrogen mustards in the palliative treatment of carcinoma: with particular  reference to bronchogenic carcinoma Cancer 1 634-56  LABORATORY DATA:  Lab Results  Component Value Date   WBC 6.7 11/17/2019   HGB 15.5 11/17/2019   HCT 46.3 11/17/2019   MCV 93 11/17/2019   PLT 168 11/17/2019   Lab Results  Component Value Date   NA 140 11/17/2019   K 4.3 11/17/2019   CL 104 11/17/2019   CO2 28 11/17/2019   No results found for: ALT, AST, GGT, ALKPHOS, BILITOT   RADIOGRAPHY: No results found.    IMPRESSION/PLAN: 1. 74 y.o. gentleman with Stage T1c adenocarcinoma of the prostate with Gleason Score of 4+4, and PSA of 18. We discussed the patient's workup and outlined the nature of prostate cancer in this setting. The patient's T stage, Gleason's score, and PSA put him into the high risk group. Accordingly, he is eligible for a variety of potential treatment options including prostatectomy or LT-ADT in combination with either 8 weeks of external radiation or 5 weeks of external radiation with an upfront brachytherapy boost. We discussed the available radiation techniques, and focused on the details and logistics of delivery. We discussed and outlined the risks, benefits, short and long-term effects associated with radiotherapy and compared and contrasted these with prostatectomy. We discussed the role of SpaceOAR gel in reducing the rectal toxicity associated with radiotherapy. We also detailed the role of ADT in the treatment of high risk prostate cancer and outlined the associated side effects that could be expected with this therapy. He and his wife were encouraged to ask questions that were answered to their stated satisfaction.  At the conclusion of our conversation, the patient is interested in moving forward with 8 weeks of external beam therapy in combination with LT-ADT. He has not received his first Lupron injection. We will share our discussion with Dr. Tresa Moore and make arrangements for start of ADT, first available.  We will also coordinate for fiducial markers and  SpaceOAR gel placement in early January 2022, prior to simulation, to reduce rectal toxicity from radiotherapy. The patient appears to have a good understanding of his disease and our treatment recommendations which are of curative intent and is in agreement with the stated plan.  Therefore, we will move forward with treatment planning accordingly, in anticipation of beginning IMRT in January 2022, approximately 2 months after starting ADT.    Nicholos Johns, PA-C    Tyler Pita, MD  [facility] Direct Dial: [phone]  Fax: [phone] [email or web address]  Skype  [email or web address]   This document serves as a record of services personally performed by Tyler Pita, MD and Freeman Caldron, PA-C. It was created on their behalf by Wilburn Mylar, a trained medical scribe. The creation of this record is based on the scribe's personal observations and the provider's statements to them. This document has been checked and approved by the attending provider.

## 2020-05-19 DIAGNOSIS — C61 Malignant neoplasm of prostate: Secondary | ICD-10-CM | POA: Insufficient documentation

## 2020-05-20 ENCOUNTER — Encounter: Payer: Self-pay | Admitting: Medical Oncology

## 2020-05-20 NOTE — Progress Notes (Signed)
Patient called stating he is scheduled for ADT 11/9 @ [facility]. He has a trip in March and would like to get appointments scheduled if possible, so he will be finished with treatment. [place] notified.

## 2020-05-21 DIAGNOSIS — C61 Malignant neoplasm of prostate: Secondary | ICD-10-CM | POA: Diagnosis not present

## 2020-05-21 DIAGNOSIS — Z5111 Encounter for antineoplastic chemotherapy: Secondary | ICD-10-CM | POA: Diagnosis not present

## 2020-05-22 NOTE — Progress Notes (Signed)
Cardiology Office Note:    Date:  05/23/2020   ID:  Matthew Robinson, DOB [birth year], MRN [id number]  PCP:  Wenda Low, MD  Cardiologist:  Dorris Carnes, MD / Dorris Carnes, MD   Referring MD: Wenda Low, MD   Pt returns for evaluation of SOB and review of echo    History of Present Illness:    Matthew Robinson is a 74 y.o. male with mitral valve prolapse, sleep apnea, hyperlipidemia.  He was admitted 05/2016 with LE cellulitis associated with strep bacteremia. He developed volume excess and required IV Lasix. Echo showed normal LVEF and mildly reduced RVSF. He was seen in our office in December 2017.  A follow-up echocardiogram in June 2018 demonstrated normal LV function, mild diastolic dysfunction normal RV systolic function.  In July 2019 he had a myovue stress test   This showed no ischemia    He was also set up for an echo Done the same day    This showed normal LV function  RV was dilated and function appeared mildly down   Based on test results I reomm R heart cath to evaluated pulmonary pressuress  I last saw the pt in 2019  Since then he has been seen by Kathleen Argue, D Dunn  He was found to have atrial flutter   Seen by Beckie Salts and uderwent atrial flutter ablation this summer (2021)   Limited echho post procedure showed LVEF 35%  No effusion  The pt says his breathing is OK without the mask  He denies CP  No palitations   No dizzy spells  He is currently being followed by Drs Tresa Moore and Tammi Klippel for prostate CA   Prior CV studies:   The following studies were reviewed today:  R heart cath    05/2018  Normal filling pressures   High cardiac outpt  No shunts RA mean 6 RV 33/8 PA 29/15, mean 21 PCWP mean 9  Oxygen saturations: SVC 84% RA 84% RV 82% PA 82% LA (PCWP) 97% AO 98%  Cardiac Output (Fick) 12.5  Cardiac Index (Fick) 4.19 April 2018  MRI  1. Moderate RV enlargement with areas of mid and basal hypokinesis but overall EF preserved 51% with no RVH  2.    Normal LV size and function EF 72%  3.  No obvious ASD/PFO/VSD PV;s not adequately seen  MYOVUE 01/2018 Normal perfusion.   LVEF 52%    Past Medical History:  Diagnosis Date  . Chronic right-sided CHF (congestive heart failure) ([place])    Echo 11/17: EF 55 and 60, normal wall motion, trivial AI, moderate RVE, mildly reduced RVSF, moderate to severe RAE // Echo 6/18: EF 60-65, Gr 1 DD, trivial AI, mild RVE, mild RAE.  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 Glucose intolerance (impaired glucose tolerance)   . Hyperlipemia   . Melanoma of skin ([place])    melanoma left shoulder. MOHS x 3.   . Mild aortic insufficiency   . Mitral valve prolapse    hx of - seen years ago by Dr Enid Derry in [facility], [place] - not seen on recent echoes  . Prostate cancer ([place])   . Sleep apnea    cpap 16   Surgical Hx: The patient  has a past surgical history that includes Melanoma excision; Retinal detachment surgery; Colonoscopy; Meniscus repair (Left, 1970); Spine surgery; Cataract extraction (Left); Tonsillectomy; Colonoscopy with propofol (N/A,  08/14/2014); RIGHT HEART CATH (N/A, 05/26/2018); and A-FLUTTER ABLATION (N/A, 12/14/2019).   Current Medications: Current Meds  Medication Sig  . amoxicillin (AMOXIL) 500 MG capsule SMARTSIG:4 Capsule(s) By Mouth  . Cholecalciferol (VITAMIN D) 2000 UNITS tablet Take 2,000 Units by mouth every other day.   . famotidine (PEPCID) 20 MG tablet Take 20 mg by mouth daily as needed for heartburn.   . furosemide (LASIX) 40 MG tablet TAKE 1 TABLET BY MOUTH ONCE DAILY AS NEEDED FOR EDEMA  . ibuprofen (ADVIL) 200 MG tablet Take 400 mg by mouth in the morning.  Matthew Robinson Leuprolide Acetate (ELIGARD [place]) Inject into the skin.  Matthew Robinson levothyroxine (SYNTHROID, LEVOTHROID) 50 MCG tablet Take 50 mcg by mouth every other day. Alternates with 75 mcg strength  . levothyroxine (SYNTHROID, LEVOTHROID) 75 MCG tablet Take 75 mcg by mouth every other day.  Alternates with 50 mcg strength  . metFORMIN (GLUCOPHAGE) 500 MG tablet Take 500 mg by mouth at bedtime.   . metoprolol succinate (TOPROL XL) 25 MG 24 hr tablet Take 25 mg daily but may take an extra 25 mg for heart rate 100-120 bpm range. Please call the office if this happens regularly.  . Multiple Vitamin (MULTIVITAMIN WITH MINERALS) TABS Take 1 tablet by mouth every other day.   . Multiple Vitamins-Minerals (PRESERVISION AREDS 2 PO) Take 1 capsule by mouth 2 (two) times daily.  . predniSONE (DELTASONE) 10 MG tablet Take 10 mg by mouth as directed.   . simvastatin (ZOCOR) 20 MG tablet Take 20 mg by mouth at bedtime.      Allergies:   Patient has no known allergies.   Social History   Tobacco Use  . Smoking status: Never Smoker  . Smokeless tobacco: Never Used  Vaping Use  . Vaping Use: Never used  Substance Use Topics  . Alcohol use: Yes    Alcohol/week: 1.0 - 3.0 standard drink    Types: 1 - 3 Glasses of wine per week    Comment: x2 weekly   . Drug use: No     Family Hx: The patient's family history includes Atrial fibrillation in his mother; Breast cancer in his mother; Cancer in his father and maternal grandfather; Heart attack in his father; Heart disease in his mother; Heart failure in his mother; Hypertension in his father; Lymphoma in his father; Mitral valve prolapse in his mother. There is no history of Prostate cancer, Colon cancer, or Pancreatic cancer.  ROS:   All systoems reviewed   Neg to above problem except as noted above    EKGs/Labs/Other Test Reviewed:    EKG:  EKG is  Not ordered today  Recent Labs: 11/17/2019: BUN 18; Creatinine, Ser 0.94; Hemoglobin 15.5; Magnesium 2.0; Platelets 168; Potassium 4.3; Sodium 140; TSH 3.160   From KPN Tool: Cholesterol, total  188.000  02/26/2017 HDL    47.000  02/26/2017 LDL    116.000  02/26/2017 Triglycerides   126.000  02/26/2017 A1C    6.500   08/31/2017 Hemoglobin   16.000  08/31/2017 Creatinine, Serum  0.970      02/26/2017 Potassium   4.800   02/26/2017 ALT (SGPT)   40.000  02/26/2017 TSH    3.930   08/31/2017  Recent Lipid Panel No results found for: CHOL, TRIG, HDL, CHOLHDL, LDLCALC, LDLDIRECT  Physical Exam:    VS:  BP 110/68   Pulse 68   Ht 6\' 6"  (1.981 m)   Wt 281 lb 9.6 oz (127.7 kg)   SpO2 93%  BMI 32.54 kg/m     Wt Readings from Last 3 Encounters:  05/23/20 281 lb 9.6 oz (127.7 kg)  05/17/20 283 lb 12.8 oz (128.7 kg)  01/18/20 275 lb (124.7 kg)     PE   Pt is in NAD    BP 146/72    P69  Sat 93% RA   Wt 287 lb  Height 6'6 HEENT   NCAT Neck:   JVP is normal  No carotid bruits Lungs are CTA    Cardiac RRR   No S3   No signif murmurs Abd is supple   Nontender    Ext with Trive  LE edema    ASSESSMENT & PLAN:    Hx atrial flutter  Pt s/p ablation   Doing good   I would recomm f/u echo to reevaluate LVEF   Hx LV dysfunction  WIll repeat echo to reeval LV and RV   RV had been prominent in past  Ha  OSA (obstructive sleep apnea) Continue with CPAP  Hx MVP   Will reevaluate on echo    Hyperlpidemia  Will need to follow  Obesity   Needs to lose wt.  Will review    F/U based on echo   If normal follow up next summer    Medication Adjustments/Labs and Tests Ordered: Current medicines are reviewed at length with the patient today.  Concerns regarding medicines are outlined above.  Tests Ordered: Orders Placed This Encounter  Procedures  . EKG 12-Lead  . ECHOCARDIOGRAM COMPLETE   Medication Changes: No orders of the defined types were placed in this encounter.   Signed, Dorris Carnes, MD  05/23/2020 12:51 PM    [facility] [address], [place], [place]  [address] Phone: [phone]; Fax: [phone]

## 2020-05-23 ENCOUNTER — Other Ambulatory Visit: Payer: Self-pay

## 2020-05-23 ENCOUNTER — Ambulatory Visit (INDEPENDENT_AMBULATORY_CARE_PROVIDER_SITE_OTHER): Payer: Medicare Other | Admitting: Internal Medicine

## 2020-05-23 ENCOUNTER — Encounter: Payer: Self-pay | Admitting: Internal Medicine

## 2020-05-23 VITALS — BP 110/68 | HR 68 | Ht 78.0 in | Wt 281.6 lb

## 2020-05-23 DIAGNOSIS — I50812 Chronic right heart failure: Secondary | ICD-10-CM | POA: Diagnosis not present

## 2020-05-23 NOTE — Patient Instructions (Signed)
Medication Instructions:  No changes *If you need a refill on your cardiac medications before your next appointment, please call your pharmacy*   Lab Work: none If you have labs (blood work) drawn today and your tests are completely normal, you will receive your results only by:  [place] (if you have MyChart) OR  A paper copy in the mail If you have any lab test that is abnormal or we need to change your treatment, we will call you to review the results.   Testing/Procedures: Your physician has requested that you have an echocardiogram. Echocardiography is a painless test that uses sound waves to create images of your heart. It provides your doctor with information about the size and shape of your heart and how well your hearts chambers and valves are working. This procedure takes approximately one hour. There are no restrictions for this procedure.   Follow-Up: Follow up with your physician will depend on test results.    Other Instructions

## 2020-05-27 ENCOUNTER — Telehealth: Payer: Self-pay | Admitting: *Deleted

## 2020-05-27 ENCOUNTER — Encounter: Payer: Self-pay | Admitting: Medical Oncology

## 2020-05-27 NOTE — Telephone Encounter (Signed)
RETURNED PATIENT'S PHONE CALL, SPOKE [place]

## 2020-05-27 NOTE — Progress Notes (Signed)
Returned call to patient to discuss scheduling of radiation. He received his ADT 11/9 and would like to get his appointment for gold markers and SpaceOar. He has a trips planned in late March and would to be completed with radiation before his departure. I asked him to speak with Enid Derry who schedules the appointments. He is very concerned about his trip, because he has had to rescheduled it twice due to [place]. He voiced understanding.

## 2020-05-29 ENCOUNTER — Telehealth: Payer: Self-pay | Admitting: *Deleted

## 2020-05-29 NOTE — Telephone Encounter (Signed)
CALLED PATIENT TO INFORM OF FID. MARKERS AND SPACE OAR PLACEMENT ON 07-17-20 @ [facility] AND HIS SIM ON 07-19-20 @ DR. MANNING'S OFFICE, SPOKE WITH PATIENT AND HE IS AWARE OF THESE APPTS.

## 2020-06-10 ENCOUNTER — Other Ambulatory Visit: Payer: Self-pay | Admitting: Urology

## 2020-06-10 DIAGNOSIS — C61 Malignant neoplasm of prostate: Secondary | ICD-10-CM

## 2020-06-11 ENCOUNTER — Other Ambulatory Visit: Payer: Self-pay

## 2020-06-11 ENCOUNTER — Ambulatory Visit (HOSPITAL_COMMUNITY): Payer: Medicare Other | Attending: Cardiology

## 2020-06-11 DIAGNOSIS — I50812 Chronic right heart failure: Secondary | ICD-10-CM | POA: Diagnosis not present

## 2020-06-11 LAB — ECHOCARDIOGRAM COMPLETE
Area-P 1/2: 2.32 cm2
P 1/2 time: 712 msec
S' Lateral: 3.3 cm

## 2020-06-15 ENCOUNTER — Other Ambulatory Visit: Payer: Self-pay | Admitting: Physician Assistant

## 2020-06-17 ENCOUNTER — Telehealth: Payer: Self-pay | Admitting: Internal Medicine

## 2020-06-17 NOTE — Telephone Encounter (Signed)
   Pt is calling to get echo result. He said if no one answered the phone to leave him a detailed message

## 2020-06-19 NOTE — Telephone Encounter (Signed)
Per ov note if echo okay will follow up next summer.  Recall placed for 6 months.

## 2020-06-19 NOTE — Telephone Encounter (Signed)
Darrell Jewel, RN  06/18/2020 3:41 PM EST Back to Top    The patient has been notified of the result and verbalized understanding. All questions (if any) were answered. Darrell Jewel, RN 06/18/2020 3:36 PM    Patient was asking about follow up appointment. From office note "Follow up with your physician will depend on test results." Will route to Dr. Alan Ripper nurse for follow up advisement

## 2020-07-08 DIAGNOSIS — K1121 Acute sialoadenitis: Secondary | ICD-10-CM | POA: Diagnosis not present

## 2020-07-15 ENCOUNTER — Encounter: Payer: Self-pay | Admitting: Medical Oncology

## 2020-07-15 NOTE — Progress Notes (Signed)
Returned call to patient to discuss CT simulation and MRI. He voiced his concerns and frustrations, regarding rescheduling of CT simulation from 1/7 to 1/11. I explained, this appointment was rescheduled because of the MRI and we needed insurance authorization prior to appointment.  He was unaware of  MRI. I explained the purpose of the MRI. He feels like it has taken a long time to get treatment started. I explained treatment started 11/9 with  ADT injection He confirmed CT simulation  1/11 @ 10:30 am and 5 pm for MRI. I will contact MRI after simulation, to see if he can be worked in earlier.  He voiced understanding and confirmed dates and times.

## 2020-07-17 DIAGNOSIS — C61 Malignant neoplasm of prostate: Secondary | ICD-10-CM | POA: Diagnosis not present

## 2020-07-19 ENCOUNTER — Ambulatory Visit: Payer: Medicare Other | Admitting: Radiation Oncology

## 2020-07-19 ENCOUNTER — Telehealth: Payer: Self-pay | Admitting: *Deleted

## 2020-07-19 NOTE — Telephone Encounter (Signed)
Called patient to inform of MRI on 07-23-20 - arrival time- 1:30 pm @ [facility] and his sim on 07-23-20 @ 3 pm @ Dr. Johny Shears Office, spoke with patient and he is aware of these appts.

## 2020-07-22 ENCOUNTER — Telehealth: Payer: Self-pay | Admitting: *Deleted

## 2020-07-22 NOTE — Telephone Encounter (Signed)
CALLED PATIENT TO REMIND OF MRI AND SIM APPT. FOR 07-23-20, SPOKE WITH PATIENT AND HE IS AWARE OF THESE APPTS.

## 2020-07-23 ENCOUNTER — Other Ambulatory Visit (HOSPITAL_COMMUNITY): Payer: Medicare Other

## 2020-07-23 ENCOUNTER — Ambulatory Visit (HOSPITAL_COMMUNITY)
Admission: RE | Admit: 2020-07-23 | Discharge: 2020-07-23 | Disposition: A | Discharge status: Home / Self Care | Payer: Medicare Other | Source: Ambulatory Visit | Attending: Urology | Admitting: Urology

## 2020-07-23 ENCOUNTER — Ambulatory Visit: Payer: Medicare Other | Admitting: Radiation Oncology

## 2020-07-23 ENCOUNTER — Encounter: Payer: Self-pay | Admitting: Medical Oncology

## 2020-07-23 ENCOUNTER — Other Ambulatory Visit: Payer: Self-pay

## 2020-07-23 ENCOUNTER — Ambulatory Visit
Admission: RE | Admit: 2020-07-23 | Discharge: 2020-07-23 | Disposition: A | Discharge status: Home / Self Care | Payer: Medicare Other | Source: Ambulatory Visit | Attending: Radiation Oncology | Admitting: Radiation Oncology

## 2020-07-23 ENCOUNTER — Ambulatory Visit (HOSPITAL_COMMUNITY): Admission: RE | Admit: 2020-07-23 | Payer: Medicare Other | Source: Ambulatory Visit

## 2020-07-23 DIAGNOSIS — C61 Malignant neoplasm of prostate: Secondary | ICD-10-CM | POA: Diagnosis not present

## 2020-07-23 DIAGNOSIS — Z510 Encounter for antineoplastic radiation therapy: Secondary | ICD-10-CM | POA: Diagnosis not present

## 2020-07-24 ENCOUNTER — Ambulatory Visit: Payer: Medicare Other | Admitting: Radiation Oncology

## 2020-07-24 NOTE — Progress Notes (Signed)
  Radiation Oncology         [phone] ________________________________  Name: Matthew Robinson MRN: [id number]  Date: 07/23/2020  DOB: May 07, 1946  SIMULATION AND TREATMENT PLANNING NOTE    ICD-10-CM   1. Malignant neoplasm of prostate ([place])  C61     DIAGNOSIS:  75 y.o. gentleman with Stage T1c adenocarcinoma of the prostate with Gleason Score of 4+4, and PSA of 18.  NARRATIVE:  The patient was brought to the [facility].  Identity was confirmed.  All relevant records and images related to the planned course of therapy were reviewed.  The patient freely provided informed written consent to proceed with treatment after reviewing the details related to the planned course of therapy. The consent form was witnessed and verified by the simulation staff.  Then, the patient was set-up in a stable reproducible supine position for radiation therapy.  A vacuum lock pillow device was custom fabricated to position his legs in a reproducible immobilized position.  Then, I performed a urethrogram under sterile conditions to identify the prostatic apex.  CT images were obtained.  Surface markings were placed.  The CT images were loaded into the planning software.  Then the prostate target and avoidance structures including the rectum, bladder, bowel and hips were contoured.  Treatment planning then occurred.  The radiation prescription was entered and confirmed.  A total of one complex treatment devices was fabricated. I have requested : Intensity Modulated Radiotherapy (IMRT) is medically necessary for this case for the following reason:  Rectal sparing.Marland Kitchen  PLAN:  The prostate, seminal vesicles, and pelvic lymph nodes will initially be treated to 45 Gy in 25 fractions of 1.8 Gy followed by a boost to the prostate only with 15 additional fractions of 2.0 Gy for a total nominal dose of 75 Gy.    ________________________________  Sheral Apley Tammi Klippel, M.D.

## 2020-07-25 DIAGNOSIS — D1801 Hemangioma of skin and subcutaneous tissue: Secondary | ICD-10-CM | POA: Diagnosis not present

## 2020-07-25 DIAGNOSIS — C44519 Basal cell carcinoma of skin of other part of trunk: Secondary | ICD-10-CM | POA: Diagnosis not present

## 2020-07-25 DIAGNOSIS — L814 Other melanin hyperpigmentation: Secondary | ICD-10-CM | POA: Diagnosis not present

## 2020-07-25 DIAGNOSIS — L821 Other seborrheic keratosis: Secondary | ICD-10-CM | POA: Diagnosis not present

## 2020-07-25 DIAGNOSIS — L570 Actinic keratosis: Secondary | ICD-10-CM | POA: Diagnosis not present

## 2020-07-25 DIAGNOSIS — Z85820 Personal history of malignant melanoma of skin: Secondary | ICD-10-CM | POA: Diagnosis not present

## 2020-07-26 DIAGNOSIS — Z510 Encounter for antineoplastic radiation therapy: Secondary | ICD-10-CM | POA: Diagnosis not present

## 2020-07-26 DIAGNOSIS — C61 Malignant neoplasm of prostate: Secondary | ICD-10-CM | POA: Diagnosis not present

## 2020-08-01 ENCOUNTER — Ambulatory Visit
Admission: RE | Admit: 2020-08-01 | Discharge: 2020-08-01 | Disposition: A | Discharge status: Home / Self Care | Payer: Medicare Other | Source: Ambulatory Visit | Attending: Radiation Oncology | Admitting: Radiation Oncology

## 2020-08-01 ENCOUNTER — Other Ambulatory Visit: Payer: Self-pay

## 2020-08-01 DIAGNOSIS — C61 Malignant neoplasm of prostate: Secondary | ICD-10-CM | POA: Diagnosis not present

## 2020-08-01 DIAGNOSIS — Z510 Encounter for antineoplastic radiation therapy: Secondary | ICD-10-CM | POA: Diagnosis not present

## 2020-08-02 ENCOUNTER — Other Ambulatory Visit: Payer: Self-pay

## 2020-08-02 ENCOUNTER — Ambulatory Visit
Admission: RE | Admit: 2020-08-02 | Discharge: 2020-08-02 | Disposition: A | Discharge status: Home / Self Care | Payer: Medicare Other | Source: Ambulatory Visit | Attending: Radiation Oncology | Admitting: Radiation Oncology

## 2020-08-02 DIAGNOSIS — C61 Malignant neoplasm of prostate: Secondary | ICD-10-CM | POA: Diagnosis not present

## 2020-08-02 DIAGNOSIS — Z510 Encounter for antineoplastic radiation therapy: Secondary | ICD-10-CM | POA: Diagnosis not present

## 2020-08-05 ENCOUNTER — Other Ambulatory Visit: Payer: Self-pay

## 2020-08-05 ENCOUNTER — Ambulatory Visit
Admission: RE | Admit: 2020-08-05 | Discharge: 2020-08-05 | Disposition: A | Discharge status: Home / Self Care | Payer: Medicare Other | Source: Ambulatory Visit | Attending: Radiation Oncology | Admitting: Radiation Oncology

## 2020-08-05 DIAGNOSIS — Z510 Encounter for antineoplastic radiation therapy: Secondary | ICD-10-CM | POA: Diagnosis not present

## 2020-08-05 DIAGNOSIS — C61 Malignant neoplasm of prostate: Secondary | ICD-10-CM | POA: Diagnosis not present

## 2020-08-06 ENCOUNTER — Ambulatory Visit
Admission: RE | Admit: 2020-08-06 | Discharge: 2020-08-06 | Disposition: A | Discharge status: Home / Self Care | Payer: Medicare Other | Source: Ambulatory Visit | Attending: Radiation Oncology | Admitting: Radiation Oncology

## 2020-08-06 DIAGNOSIS — Z510 Encounter for antineoplastic radiation therapy: Secondary | ICD-10-CM | POA: Diagnosis not present

## 2020-08-06 DIAGNOSIS — C61 Malignant neoplasm of prostate: Secondary | ICD-10-CM | POA: Diagnosis not present

## 2020-08-07 ENCOUNTER — Ambulatory Visit
Admission: RE | Admit: 2020-08-07 | Discharge: 2020-08-07 | Disposition: A | Discharge status: Home / Self Care | Payer: Medicare Other | Source: Ambulatory Visit | Attending: Radiation Oncology | Admitting: Radiation Oncology

## 2020-08-07 DIAGNOSIS — C61 Malignant neoplasm of prostate: Secondary | ICD-10-CM | POA: Diagnosis not present

## 2020-08-07 DIAGNOSIS — Z510 Encounter for antineoplastic radiation therapy: Secondary | ICD-10-CM | POA: Diagnosis not present

## 2020-08-08 ENCOUNTER — Other Ambulatory Visit: Payer: Self-pay

## 2020-08-08 ENCOUNTER — Ambulatory Visit
Admission: RE | Admit: 2020-08-08 | Discharge: 2020-08-08 | Disposition: A | Discharge status: Home / Self Care | Payer: Medicare Other | Source: Ambulatory Visit | Attending: Radiation Oncology | Admitting: Radiation Oncology

## 2020-08-08 DIAGNOSIS — Z510 Encounter for antineoplastic radiation therapy: Secondary | ICD-10-CM | POA: Diagnosis not present

## 2020-08-08 DIAGNOSIS — C61 Malignant neoplasm of prostate: Secondary | ICD-10-CM | POA: Diagnosis not present

## 2020-08-09 ENCOUNTER — Ambulatory Visit
Admission: RE | Admit: 2020-08-09 | Discharge: 2020-08-09 | Disposition: A | Discharge status: Home / Self Care | Payer: Medicare Other | Source: Ambulatory Visit | Attending: Radiation Oncology | Admitting: Radiation Oncology

## 2020-08-09 ENCOUNTER — Other Ambulatory Visit: Payer: Self-pay

## 2020-08-09 DIAGNOSIS — C61 Malignant neoplasm of prostate: Secondary | ICD-10-CM | POA: Diagnosis not present

## 2020-08-09 DIAGNOSIS — Z510 Encounter for antineoplastic radiation therapy: Secondary | ICD-10-CM | POA: Diagnosis not present

## 2020-08-12 ENCOUNTER — Ambulatory Visit
Admission: RE | Admit: 2020-08-12 | Discharge: 2020-08-12 | Disposition: A | Discharge status: Home / Self Care | Payer: Medicare Other | Source: Ambulatory Visit | Attending: Radiation Oncology | Admitting: Radiation Oncology

## 2020-08-12 ENCOUNTER — Telehealth: Payer: Self-pay | Admitting: Radiation Oncology

## 2020-08-12 DIAGNOSIS — C61 Malignant neoplasm of prostate: Secondary | ICD-10-CM | POA: Diagnosis not present

## 2020-08-12 DIAGNOSIS — Z510 Encounter for antineoplastic radiation therapy: Secondary | ICD-10-CM | POA: Diagnosis not present

## 2020-08-12 NOTE — Telephone Encounter (Signed)
Received voicemail message from [place] at [facility] requesting a callback to verify information for claim number [id number]. Also, voicemail message had patient providing verbal consent to release requested information. Phoned back. Spoke with [employer]. Provided diagnosis, start date and end date of radiation, number of treatments and Dr. Johny Shears tax ID number.

## 2020-08-13 ENCOUNTER — Other Ambulatory Visit: Payer: Self-pay

## 2020-08-13 ENCOUNTER — Other Ambulatory Visit: Payer: Self-pay | Admitting: Radiation Oncology

## 2020-08-13 ENCOUNTER — Ambulatory Visit
Admission: RE | Admit: 2020-08-13 | Discharge: 2020-08-13 | Disposition: A | Discharge status: Home / Self Care | Payer: Medicare Other | Source: Ambulatory Visit | Attending: Radiation Oncology | Admitting: Radiation Oncology

## 2020-08-13 DIAGNOSIS — Z510 Encounter for antineoplastic radiation therapy: Secondary | ICD-10-CM | POA: Insufficient documentation

## 2020-08-13 DIAGNOSIS — C61 Malignant neoplasm of prostate: Secondary | ICD-10-CM | POA: Diagnosis not present

## 2020-08-13 MED ORDER — DIPHENOXYLATE-ATROPINE 2.5-0.025 MG PO TABS: 1.0000 | ORAL_TABLET | Freq: Four times a day (QID) | ORAL | 5 refills | Status: AC | PRN

## 2020-08-13 NOTE — Telephone Encounter (Signed)
Per Romie Jumper this patient presented to the clinic following radiation treatment requesting to see this RN. Patient agreed I could contact him via phone following consultations.   Phoned patient's mobile number to inquire about needs. Patient request that Dr. Tammi Klippel prescribe Lomotil and send the script to [facility], [facility] and [employer] [facility]. Patient states, "I won't abuse it; I am a pharmacist." Patient reports Imodium hasn't helped relieve his diarrhea. Denies abdominal distention or tenderness. Denies blood in stool. Denies nausea or vomiting.   Patient has received 9 of 25 radiation treatments to his prostate_pelvic region to date. Polyps on past colonoscopy is the only GI issue noted on his medical record.   Patient understands this RN will forward his request to Dr. Tammi Klippel.

## 2020-08-13 NOTE — Addendum Note (Signed)
Addended by: Tyler Pita on: 08/13/2020 12:49 PM   Modules accepted: Orders

## 2020-08-14 ENCOUNTER — Other Ambulatory Visit: Payer: Self-pay

## 2020-08-14 ENCOUNTER — Telehealth: Payer: Self-pay | Admitting: Radiation Oncology

## 2020-08-14 ENCOUNTER — Ambulatory Visit
Admission: RE | Admit: 2020-08-14 | Discharge: 2020-08-14 | Disposition: A | Discharge status: Home / Self Care | Payer: Medicare Other | Source: Ambulatory Visit | Attending: Radiation Oncology | Admitting: Radiation Oncology

## 2020-08-14 DIAGNOSIS — C61 Malignant neoplasm of prostate: Secondary | ICD-10-CM | POA: Diagnosis not present

## 2020-08-14 DIAGNOSIS — Z510 Encounter for antineoplastic radiation therapy: Secondary | ICD-10-CM | POA: Diagnosis not present

## 2020-08-14 NOTE — Telephone Encounter (Signed)
Received voicemail message from patient requesting Lomotil be sent to [facility], [facility] and [employer]. Patient without any GI history outside of a polyp on colonoscopy in the past. Patient reports Imodium isn't helping. Patient reports four episodes of diarrhea in the last 24 hours. Offered to call patient back after consulting with Dr. Tammi Klippel and Freeman Caldron, PA-C but the patient explained his pharmacy will text him when the script is ready so, "there is no need."

## 2020-08-15 ENCOUNTER — Ambulatory Visit
Admission: RE | Admit: 2020-08-15 | Discharge: 2020-08-15 | Disposition: A | Discharge status: Home / Self Care | Payer: Medicare Other | Source: Ambulatory Visit | Attending: Radiation Oncology | Admitting: Radiation Oncology

## 2020-08-15 DIAGNOSIS — C61 Malignant neoplasm of prostate: Secondary | ICD-10-CM | POA: Diagnosis not present

## 2020-08-15 DIAGNOSIS — Z510 Encounter for antineoplastic radiation therapy: Secondary | ICD-10-CM | POA: Diagnosis not present

## 2020-08-16 ENCOUNTER — Other Ambulatory Visit: Payer: Self-pay

## 2020-08-16 ENCOUNTER — Ambulatory Visit
Admission: RE | Admit: 2020-08-16 | Discharge: 2020-08-16 | Disposition: A | Discharge status: Home / Self Care | Payer: Medicare Other | Source: Ambulatory Visit | Attending: Radiation Oncology | Admitting: Radiation Oncology

## 2020-08-16 DIAGNOSIS — Z510 Encounter for antineoplastic radiation therapy: Secondary | ICD-10-CM | POA: Diagnosis not present

## 2020-08-16 DIAGNOSIS — C61 Malignant neoplasm of prostate: Secondary | ICD-10-CM | POA: Diagnosis not present

## 2020-08-19 ENCOUNTER — Ambulatory Visit
Admission: RE | Admit: 2020-08-19 | Discharge: 2020-08-19 | Disposition: A | Discharge status: Home / Self Care | Payer: Medicare Other | Source: Ambulatory Visit | Attending: Radiation Oncology | Admitting: Radiation Oncology

## 2020-08-19 ENCOUNTER — Other Ambulatory Visit: Payer: Self-pay

## 2020-08-19 DIAGNOSIS — Z510 Encounter for antineoplastic radiation therapy: Secondary | ICD-10-CM | POA: Diagnosis not present

## 2020-08-19 DIAGNOSIS — C61 Malignant neoplasm of prostate: Secondary | ICD-10-CM | POA: Diagnosis not present

## 2020-08-20 ENCOUNTER — Other Ambulatory Visit: Payer: Self-pay

## 2020-08-20 ENCOUNTER — Ambulatory Visit
Admission: RE | Admit: 2020-08-20 | Discharge: 2020-08-20 | Disposition: A | Discharge status: Home / Self Care | Payer: Medicare Other | Source: Ambulatory Visit | Attending: Radiation Oncology | Admitting: Radiation Oncology

## 2020-08-20 DIAGNOSIS — Z510 Encounter for antineoplastic radiation therapy: Secondary | ICD-10-CM | POA: Diagnosis not present

## 2020-08-20 DIAGNOSIS — C61 Malignant neoplasm of prostate: Secondary | ICD-10-CM | POA: Diagnosis not present

## 2020-08-21 ENCOUNTER — Ambulatory Visit
Admission: RE | Admit: 2020-08-21 | Discharge: 2020-08-21 | Disposition: A | Discharge status: Home / Self Care | Payer: Medicare Other | Source: Ambulatory Visit | Attending: Radiation Oncology | Admitting: Radiation Oncology

## 2020-08-21 ENCOUNTER — Other Ambulatory Visit: Payer: Self-pay

## 2020-08-21 DIAGNOSIS — C61 Malignant neoplasm of prostate: Secondary | ICD-10-CM | POA: Diagnosis not present

## 2020-08-21 DIAGNOSIS — Z510 Encounter for antineoplastic radiation therapy: Secondary | ICD-10-CM | POA: Diagnosis not present

## 2020-08-22 ENCOUNTER — Ambulatory Visit
Admission: RE | Admit: 2020-08-22 | Discharge: 2020-08-22 | Disposition: A | Discharge status: Home / Self Care | Payer: Medicare Other | Source: Ambulatory Visit | Attending: Radiation Oncology | Admitting: Radiation Oncology

## 2020-08-22 DIAGNOSIS — C61 Malignant neoplasm of prostate: Secondary | ICD-10-CM | POA: Diagnosis not present

## 2020-08-22 DIAGNOSIS — Z510 Encounter for antineoplastic radiation therapy: Secondary | ICD-10-CM | POA: Diagnosis not present

## 2020-08-23 ENCOUNTER — Ambulatory Visit
Admission: RE | Admit: 2020-08-23 | Discharge: 2020-08-23 | Disposition: A | Discharge status: Home / Self Care | Payer: Medicare Other | Source: Ambulatory Visit | Attending: Radiation Oncology | Admitting: Radiation Oncology

## 2020-08-23 ENCOUNTER — Other Ambulatory Visit: Payer: Self-pay

## 2020-08-23 DIAGNOSIS — Z510 Encounter for antineoplastic radiation therapy: Secondary | ICD-10-CM | POA: Diagnosis not present

## 2020-08-23 DIAGNOSIS — C61 Malignant neoplasm of prostate: Secondary | ICD-10-CM | POA: Diagnosis not present

## 2020-08-26 ENCOUNTER — Ambulatory Visit
Admission: RE | Admit: 2020-08-26 | Discharge: 2020-08-26 | Disposition: A | Discharge status: Home / Self Care | Payer: Medicare Other | Source: Ambulatory Visit | Attending: Radiation Oncology | Admitting: Radiation Oncology

## 2020-08-26 ENCOUNTER — Other Ambulatory Visit: Payer: Self-pay

## 2020-08-26 DIAGNOSIS — Z510 Encounter for antineoplastic radiation therapy: Secondary | ICD-10-CM | POA: Diagnosis not present

## 2020-08-26 DIAGNOSIS — C61 Malignant neoplasm of prostate: Secondary | ICD-10-CM | POA: Diagnosis not present

## 2020-08-26 NOTE — Progress Notes (Signed)
Completed attending physician statement for [employer] on behalf of this patient. Provided patient with completed form and correlating office notes. Scanned copy of completed form into EMR.

## 2020-08-27 ENCOUNTER — Other Ambulatory Visit: Payer: Self-pay

## 2020-08-27 ENCOUNTER — Ambulatory Visit
Admission: RE | Admit: 2020-08-27 | Discharge: 2020-08-27 | Disposition: A | Discharge status: Home / Self Care | Payer: Medicare Other | Source: Ambulatory Visit | Attending: Radiation Oncology | Admitting: Radiation Oncology

## 2020-08-27 DIAGNOSIS — Z510 Encounter for antineoplastic radiation therapy: Secondary | ICD-10-CM | POA: Diagnosis not present

## 2020-08-27 DIAGNOSIS — C61 Malignant neoplasm of prostate: Secondary | ICD-10-CM | POA: Diagnosis not present

## 2020-08-28 ENCOUNTER — Other Ambulatory Visit: Payer: Self-pay

## 2020-08-28 ENCOUNTER — Ambulatory Visit
Admission: RE | Admit: 2020-08-28 | Discharge: 2020-08-28 | Disposition: A | Discharge status: Home / Self Care | Payer: Medicare Other | Source: Ambulatory Visit | Attending: Radiation Oncology | Admitting: Radiation Oncology

## 2020-08-28 DIAGNOSIS — C61 Malignant neoplasm of prostate: Secondary | ICD-10-CM | POA: Diagnosis not present

## 2020-08-28 DIAGNOSIS — Z510 Encounter for antineoplastic radiation therapy: Secondary | ICD-10-CM | POA: Diagnosis not present

## 2020-08-29 ENCOUNTER — Other Ambulatory Visit: Payer: Self-pay

## 2020-08-29 ENCOUNTER — Encounter: Payer: Self-pay | Admitting: Podiatry

## 2020-08-29 ENCOUNTER — Ambulatory Visit
Admission: RE | Admit: 2020-08-29 | Discharge: 2020-08-29 | Disposition: A | Discharge status: Home / Self Care | Payer: Medicare Other | Source: Ambulatory Visit | Attending: Radiation Oncology | Admitting: Radiation Oncology

## 2020-08-29 ENCOUNTER — Ambulatory Visit (INDEPENDENT_AMBULATORY_CARE_PROVIDER_SITE_OTHER): Payer: Medicare Other | Admitting: Podiatry

## 2020-08-29 DIAGNOSIS — E11621 Type 2 diabetes mellitus with foot ulcer: Secondary | ICD-10-CM | POA: Insufficient documentation

## 2020-08-29 DIAGNOSIS — M7989 Other specified soft tissue disorders: Secondary | ICD-10-CM

## 2020-08-29 DIAGNOSIS — J309 Allergic rhinitis, unspecified: Secondary | ICD-10-CM | POA: Insufficient documentation

## 2020-08-29 DIAGNOSIS — I509 Heart failure, unspecified: Secondary | ICD-10-CM | POA: Insufficient documentation

## 2020-08-29 DIAGNOSIS — L603 Nail dystrophy: Secondary | ICD-10-CM | POA: Diagnosis not present

## 2020-08-29 DIAGNOSIS — Z510 Encounter for antineoplastic radiation therapy: Secondary | ICD-10-CM | POA: Diagnosis not present

## 2020-08-29 DIAGNOSIS — E7800 Pure hypercholesterolemia, unspecified: Secondary | ICD-10-CM | POA: Insufficient documentation

## 2020-08-29 DIAGNOSIS — E1142 Type 2 diabetes mellitus with diabetic polyneuropathy: Secondary | ICD-10-CM

## 2020-08-29 DIAGNOSIS — E669 Obesity, unspecified: Secondary | ICD-10-CM | POA: Insufficient documentation

## 2020-08-29 DIAGNOSIS — N400 Enlarged prostate without lower urinary tract symptoms: Secondary | ICD-10-CM | POA: Insufficient documentation

## 2020-08-29 DIAGNOSIS — D696 Thrombocytopenia, unspecified: Secondary | ICD-10-CM | POA: Insufficient documentation

## 2020-08-29 DIAGNOSIS — C61 Malignant neoplasm of prostate: Secondary | ICD-10-CM | POA: Diagnosis not present

## 2020-08-29 DIAGNOSIS — I5189 Other ill-defined heart diseases: Secondary | ICD-10-CM | POA: Insufficient documentation

## 2020-08-29 DIAGNOSIS — M519 Unspecified thoracic, thoracolumbar and lumbosacral intervertebral disc disorder: Secondary | ICD-10-CM | POA: Insufficient documentation

## 2020-08-29 DIAGNOSIS — M1990 Unspecified osteoarthritis, unspecified site: Secondary | ICD-10-CM | POA: Insufficient documentation

## 2020-08-29 DIAGNOSIS — I5032 Chronic diastolic (congestive) heart failure: Secondary | ICD-10-CM | POA: Insufficient documentation

## 2020-08-29 DIAGNOSIS — L97528 Non-pressure chronic ulcer of other part of left foot with other specified severity: Secondary | ICD-10-CM | POA: Insufficient documentation

## 2020-08-29 DIAGNOSIS — I872 Venous insufficiency (chronic) (peripheral): Secondary | ICD-10-CM | POA: Insufficient documentation

## 2020-08-29 DIAGNOSIS — C439 Malignant melanoma of skin, unspecified: Secondary | ICD-10-CM | POA: Insufficient documentation

## 2020-08-29 DIAGNOSIS — R972 Elevated prostate specific antigen [PSA]: Secondary | ICD-10-CM | POA: Insufficient documentation

## 2020-08-29 HISTORY — DX: Type 2 diabetes mellitus with diabetic polyneuropathy: E11.42

## 2020-08-30 ENCOUNTER — Ambulatory Visit
Admission: RE | Admit: 2020-08-30 | Discharge: 2020-08-30 | Disposition: A | Discharge status: Home / Self Care | Payer: Medicare Other | Source: Ambulatory Visit | Attending: Radiation Oncology | Admitting: Radiation Oncology

## 2020-08-30 DIAGNOSIS — C61 Malignant neoplasm of prostate: Secondary | ICD-10-CM | POA: Diagnosis not present

## 2020-08-30 DIAGNOSIS — Z510 Encounter for antineoplastic radiation therapy: Secondary | ICD-10-CM | POA: Diagnosis not present

## 2020-09-01 NOTE — Progress Notes (Signed)
Subjective:  Patient ID: Matthew Robinson, male    DOB: 09/19/1945,  MRN: [id number] HPI Chief Complaint  Patient presents with  . Foot Pain    Patient states he has swelling in both feet, wears compression on left only, 2nd toe left has a scab x 2 years-no change, concerned about toenails fungus-interested in laser, neuropathic but doesn't take meds  . New Patient (Initial Visit)    75 y.o. male presents with the above complaint.   ROS: Denies fever chills nausea vomiting muscle aches pains calf pain back pain chest pain shortness of breath.  Past Medical History:  Diagnosis Date  . Chronic right-sided CHF (congestive heart failure) ([place])    Echo 11/17: EF 55 and 60, normal wall motion, trivial AI, moderate RVE, mildly reduced RVSF, moderate to severe RAE // Echo 6/18: EF 60-65, Gr 1 DD, trivial AI, mild RVE, mild RAE.  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 Diabetic peripheral neuropathy associated with type 2 diabetes mellitus ([place]) 08/29/2020  . Glucose intolerance (impaired glucose tolerance)   . Hyperlipemia   . Melanoma of skin ([place])    melanoma left shoulder. MOHS x 3.   . Mild aortic insufficiency   . Mitral valve prolapse    hx of - seen years ago by Dr Enid Derry in [facility], [place] - not seen on recent echoes  . Prostate cancer ([place])   . Sleep apnea    cpap 16   Past Surgical History:  Procedure Laterality Date  . A-FLUTTER ABLATION N/A 12/14/2019   Procedure: A-FLUTTER ABLATION;  Surgeon: Evans Lance, MD;  Location: [facility];  Service: Cardiovascular;  Laterality: N/A;  . CATARACT EXTRACTION Left   . COLONOSCOPY     polyps in past  . COLONOSCOPY WITH PROPOFOL N/A 08/14/2014   Procedure: COLONOSCOPY WITH PROPOFOL;  Surgeon: Garlan Fair, MD;  Location: [facility];  Service: Endoscopy;  Laterality: N/A;  . MELANOMA EXCISION    . MENISCUS REPAIR Left 1970  . RETINAL DETACHMENT SURGERY    . RIGHT HEART CATH  N/A 05/26/2018   Procedure: RIGHT HEART CATH;  Surgeon: Larey Dresser, MD;  Location: [facility];  Service: Cardiovascular;  Laterality: N/A;  . SPINE SURGERY     4'14 lumbar 3,4,5 fusion([place])  . TONSILLECTOMY      Current Outpatient Medications:  .  Omega-3 Fatty Acids (FISH OIL) 1200 MG CAPS, 1 capsule, Disp: , Rfl:  .  Potassium Chloride ER 20 MEQ TBCR, 1 tab, Disp: , Rfl:  .  amoxicillin (AMOXIL) 500 MG capsule, SMARTSIG:4 Capsule(s) By Mouth, Disp: , Rfl:  .  Cholecalciferol (VITAMIN D) 2000 UNITS tablet, Take 2,000 Units by mouth every other day. , Disp: , Rfl:  .  diphenoxylate-atropine (LOMOTIL) 2.5-0.025 MG tablet, Take 1-2 tablets by mouth 4 (four) times daily as needed for diarrhea or loose stools., Disp: 40 tablet, Rfl: 5 .  famotidine (PEPCID) 20 MG tablet, Take 20 mg by mouth daily as needed for heartburn. , Disp: , Rfl:  .  furosemide (LASIX) 40 MG tablet, TAKE 1 TABLET BY MOUTH ONCE DAILY AS NEEDED FOR EDEMA, Disp: 90 tablet, Rfl: 2 .  ibuprofen (ADVIL) 200 MG tablet, Take 400 mg by mouth in the morning., Disp: , Rfl:  .  Leuprolide Acetate (ELIGARD [place]), Inject into the skin., Disp: , Rfl:  .  levothyroxine (SYNTHROID, LEVOTHROID) 50 MCG  tablet, Take 50 mcg by mouth every other day. Alternates with 75 mcg strength, Disp: , Rfl:  .  levothyroxine (SYNTHROID, LEVOTHROID) 75 MCG tablet, Take 75 mcg by mouth every other day. Alternates with 50 mcg strength, Disp: , Rfl:  .  metFORMIN (GLUCOPHAGE) 500 MG tablet, Take 500 mg by mouth at bedtime. , Disp: , Rfl: 2 .  metoprolol tartrate (LOPRESSOR) 25 MG tablet, 1 tablet with food, Disp: , Rfl:  .  Multiple Vitamin (MULTIVITAMIN WITH MINERALS) TABS, Take 1 tablet by mouth every other day. , Disp: , Rfl:  .  Multiple Vitamins-Minerals (PRESERVISION AREDS 2 PO), Take 1 capsule by mouth 2 (two) times daily., Disp: , Rfl:  .  predniSONE (DELTASONE) 10 MG tablet, Take 10 mg by mouth as directed. , Disp: , Rfl:   .  simvastatin (ZOCOR) 20 MG tablet, Take 20 mg by mouth at bedtime. , Disp: , Rfl:   No Known Allergies Review of Systems Objective:  There were no vitals filed for this visit.  General: Well developed, nourished, in no acute distress, alert and oriented x3   Dermatological: Skin is warm, dry and supple bilateral. Nails x 10 are slightly thickened dystrophic clinically mycotic.; remaining integument appears unremarkable at this time. There are no open sores, no preulcerative lesions, no rash or signs of infection present.  Mild to moderate edema left lower extremity without abnormal vascularity's.  Vascular: Dorsalis Pedis artery and Posterior Tibial artery pedal pulses are 2/4 bilateral with immedate capillary fill time. Pedal hair growth present. No varicosities and no lower extremity edema present bilateral.  No vascularities.  Pitting edema left.  Neruologic: Grossly absent via light touch bilateral. Vibratory absent via tuning fork bilateral. Protective threshold with Semmes Wienstein monofilamen absent to all pedal sites bilateral. Patellar and Achilles deep tendon reflexes 2+ bilateral. No Babinski or clonus noted bilateral.   Musculoskeletal: No gross boney pedal deformities bilateral. No pain, crepitus, or limitation noted with foot and ankle range of motion bilateral. Muscular strength 5/5 in all groups tested bilateral.  Gait: Unassisted, Nonantalgic.    Radiographs:  None taken  Assessment & Plan:   Assessment: Profound diabetic neuropathy.  Nail dystrophy  Plan: Samples of the skin and nail were taken today for pathologic evaluation follow-up with him once letter turns in about 1 month.     Loray Akard T. [place], [place]

## 2020-09-02 ENCOUNTER — Other Ambulatory Visit: Payer: Self-pay

## 2020-09-02 ENCOUNTER — Ambulatory Visit
Admission: RE | Admit: 2020-09-02 | Discharge: 2020-09-02 | Disposition: A | Discharge status: Home / Self Care | Payer: Medicare Other | Source: Ambulatory Visit | Attending: Radiation Oncology | Admitting: Radiation Oncology

## 2020-09-02 DIAGNOSIS — Z510 Encounter for antineoplastic radiation therapy: Secondary | ICD-10-CM | POA: Diagnosis not present

## 2020-09-02 DIAGNOSIS — C61 Malignant neoplasm of prostate: Secondary | ICD-10-CM | POA: Diagnosis not present

## 2020-09-03 ENCOUNTER — Ambulatory Visit
Admission: RE | Admit: 2020-09-03 | Discharge: 2020-09-03 | Disposition: A | Discharge status: Home / Self Care | Payer: Medicare Other | Source: Ambulatory Visit | Attending: Radiation Oncology | Admitting: Radiation Oncology

## 2020-09-03 ENCOUNTER — Other Ambulatory Visit: Payer: Self-pay

## 2020-09-03 DIAGNOSIS — Z510 Encounter for antineoplastic radiation therapy: Secondary | ICD-10-CM | POA: Diagnosis not present

## 2020-09-03 DIAGNOSIS — C61 Malignant neoplasm of prostate: Secondary | ICD-10-CM | POA: Diagnosis not present

## 2020-09-04 ENCOUNTER — Ambulatory Visit
Admission: RE | Admit: 2020-09-04 | Discharge: 2020-09-04 | Disposition: A | Discharge status: Home / Self Care | Payer: Medicare Other | Source: Ambulatory Visit | Attending: Radiation Oncology | Admitting: Radiation Oncology

## 2020-09-04 ENCOUNTER — Other Ambulatory Visit: Payer: Self-pay

## 2020-09-04 DIAGNOSIS — Z510 Encounter for antineoplastic radiation therapy: Secondary | ICD-10-CM | POA: Diagnosis not present

## 2020-09-04 DIAGNOSIS — L603 Nail dystrophy: Secondary | ICD-10-CM | POA: Diagnosis not present

## 2020-09-04 DIAGNOSIS — B351 Tinea unguium: Secondary | ICD-10-CM | POA: Diagnosis not present

## 2020-09-04 DIAGNOSIS — C61 Malignant neoplasm of prostate: Secondary | ICD-10-CM | POA: Diagnosis not present

## 2020-09-05 ENCOUNTER — Ambulatory Visit
Admission: RE | Admit: 2020-09-05 | Discharge: 2020-09-05 | Disposition: A | Discharge status: Home / Self Care | Payer: Medicare Other | Source: Ambulatory Visit | Attending: Radiation Oncology | Admitting: Radiation Oncology

## 2020-09-05 ENCOUNTER — Other Ambulatory Visit: Payer: Self-pay

## 2020-09-05 DIAGNOSIS — Z510 Encounter for antineoplastic radiation therapy: Secondary | ICD-10-CM | POA: Diagnosis not present

## 2020-09-05 DIAGNOSIS — C61 Malignant neoplasm of prostate: Secondary | ICD-10-CM | POA: Diagnosis not present

## 2020-09-06 ENCOUNTER — Other Ambulatory Visit: Payer: Self-pay

## 2020-09-06 ENCOUNTER — Ambulatory Visit
Admission: RE | Admit: 2020-09-06 | Discharge: 2020-09-06 | Disposition: A | Discharge status: Home / Self Care | Payer: Medicare Other | Source: Ambulatory Visit | Attending: Radiation Oncology | Admitting: Radiation Oncology

## 2020-09-06 DIAGNOSIS — C61 Malignant neoplasm of prostate: Secondary | ICD-10-CM | POA: Diagnosis not present

## 2020-09-06 DIAGNOSIS — Z510 Encounter for antineoplastic radiation therapy: Secondary | ICD-10-CM | POA: Diagnosis not present

## 2020-09-09 ENCOUNTER — Ambulatory Visit
Admission: RE | Admit: 2020-09-09 | Discharge: 2020-09-09 | Disposition: A | Discharge status: Home / Self Care | Payer: Medicare Other | Source: Ambulatory Visit | Attending: Radiation Oncology | Admitting: Radiation Oncology

## 2020-09-09 DIAGNOSIS — C61 Malignant neoplasm of prostate: Secondary | ICD-10-CM | POA: Diagnosis not present

## 2020-09-09 DIAGNOSIS — Z510 Encounter for antineoplastic radiation therapy: Secondary | ICD-10-CM | POA: Diagnosis not present

## 2020-09-10 ENCOUNTER — Other Ambulatory Visit: Payer: Self-pay

## 2020-09-10 ENCOUNTER — Ambulatory Visit
Admission: RE | Admit: 2020-09-10 | Discharge: 2020-09-10 | Disposition: A | Discharge status: Home / Self Care | Payer: Medicare Other | Source: Ambulatory Visit | Attending: Radiation Oncology | Admitting: Radiation Oncology

## 2020-09-10 DIAGNOSIS — C61 Malignant neoplasm of prostate: Secondary | ICD-10-CM | POA: Insufficient documentation

## 2020-09-10 DIAGNOSIS — Z510 Encounter for antineoplastic radiation therapy: Secondary | ICD-10-CM | POA: Diagnosis not present

## 2020-09-11 ENCOUNTER — Other Ambulatory Visit: Payer: Self-pay

## 2020-09-11 ENCOUNTER — Ambulatory Visit
Admission: RE | Admit: 2020-09-11 | Discharge: 2020-09-11 | Disposition: A | Discharge status: Home / Self Care | Payer: Medicare Other | Source: Ambulatory Visit | Attending: Radiation Oncology | Admitting: Radiation Oncology

## 2020-09-11 DIAGNOSIS — C61 Malignant neoplasm of prostate: Secondary | ICD-10-CM | POA: Diagnosis not present

## 2020-09-11 DIAGNOSIS — Z510 Encounter for antineoplastic radiation therapy: Secondary | ICD-10-CM | POA: Diagnosis not present

## 2020-09-12 ENCOUNTER — Ambulatory Visit
Admission: RE | Admit: 2020-09-12 | Discharge: 2020-09-12 | Disposition: A | Discharge status: Home / Self Care | Payer: Medicare Other | Source: Ambulatory Visit | Attending: Radiation Oncology | Admitting: Radiation Oncology

## 2020-09-12 ENCOUNTER — Other Ambulatory Visit: Payer: Self-pay

## 2020-09-12 DIAGNOSIS — C61 Malignant neoplasm of prostate: Secondary | ICD-10-CM | POA: Diagnosis not present

## 2020-09-12 DIAGNOSIS — Z510 Encounter for antineoplastic radiation therapy: Secondary | ICD-10-CM | POA: Diagnosis not present

## 2020-09-13 ENCOUNTER — Ambulatory Visit
Admission: RE | Admit: 2020-09-13 | Discharge: 2020-09-13 | Disposition: A | Discharge status: Home / Self Care | Payer: Medicare Other | Source: Ambulatory Visit | Attending: Radiation Oncology | Admitting: Radiation Oncology

## 2020-09-13 ENCOUNTER — Other Ambulatory Visit: Payer: Self-pay

## 2020-09-13 DIAGNOSIS — C61 Malignant neoplasm of prostate: Secondary | ICD-10-CM | POA: Diagnosis not present

## 2020-09-13 DIAGNOSIS — Z510 Encounter for antineoplastic radiation therapy: Secondary | ICD-10-CM | POA: Diagnosis not present

## 2020-09-16 ENCOUNTER — Other Ambulatory Visit: Payer: Self-pay

## 2020-09-16 ENCOUNTER — Ambulatory Visit
Admission: RE | Admit: 2020-09-16 | Discharge: 2020-09-16 | Disposition: A | Discharge status: Home / Self Care | Payer: Medicare Other | Source: Ambulatory Visit | Attending: Radiation Oncology | Admitting: Radiation Oncology

## 2020-09-16 DIAGNOSIS — Z510 Encounter for antineoplastic radiation therapy: Secondary | ICD-10-CM | POA: Diagnosis not present

## 2020-09-16 DIAGNOSIS — C61 Malignant neoplasm of prostate: Secondary | ICD-10-CM | POA: Diagnosis not present

## 2020-09-17 ENCOUNTER — Ambulatory Visit
Admission: RE | Admit: 2020-09-17 | Discharge: 2020-09-17 | Disposition: A | Discharge status: Home / Self Care | Payer: Medicare Other | Source: Ambulatory Visit | Attending: Radiation Oncology | Admitting: Radiation Oncology

## 2020-09-17 ENCOUNTER — Other Ambulatory Visit: Payer: Self-pay

## 2020-09-17 DIAGNOSIS — Z510 Encounter for antineoplastic radiation therapy: Secondary | ICD-10-CM | POA: Diagnosis not present

## 2020-09-17 DIAGNOSIS — C61 Malignant neoplasm of prostate: Secondary | ICD-10-CM | POA: Diagnosis not present

## 2020-09-18 ENCOUNTER — Other Ambulatory Visit: Payer: Self-pay

## 2020-09-18 ENCOUNTER — Ambulatory Visit
Admission: RE | Admit: 2020-09-18 | Discharge: 2020-09-18 | Disposition: A | Discharge status: Home / Self Care | Payer: Medicare Other | Source: Ambulatory Visit | Attending: Radiation Oncology | Admitting: Radiation Oncology

## 2020-09-18 DIAGNOSIS — C61 Malignant neoplasm of prostate: Secondary | ICD-10-CM | POA: Diagnosis not present

## 2020-09-18 DIAGNOSIS — Z510 Encounter for antineoplastic radiation therapy: Secondary | ICD-10-CM | POA: Diagnosis not present

## 2020-09-19 ENCOUNTER — Ambulatory Visit
Admission: RE | Admit: 2020-09-19 | Discharge: 2020-09-19 | Disposition: A | Discharge status: Home / Self Care | Payer: Medicare Other | Source: Ambulatory Visit | Attending: Radiation Oncology | Admitting: Radiation Oncology

## 2020-09-19 ENCOUNTER — Other Ambulatory Visit: Payer: Self-pay

## 2020-09-19 DIAGNOSIS — Z510 Encounter for antineoplastic radiation therapy: Secondary | ICD-10-CM | POA: Diagnosis not present

## 2020-09-19 DIAGNOSIS — C61 Malignant neoplasm of prostate: Secondary | ICD-10-CM | POA: Diagnosis not present

## 2020-09-20 ENCOUNTER — Other Ambulatory Visit: Payer: Self-pay

## 2020-09-20 ENCOUNTER — Ambulatory Visit
Admission: RE | Admit: 2020-09-20 | Discharge: 2020-09-20 | Disposition: A | Discharge status: Home / Self Care | Payer: Medicare Other | Source: Ambulatory Visit | Attending: Radiation Oncology | Admitting: Radiation Oncology

## 2020-09-20 DIAGNOSIS — Z510 Encounter for antineoplastic radiation therapy: Secondary | ICD-10-CM | POA: Diagnosis not present

## 2020-09-20 DIAGNOSIS — C61 Malignant neoplasm of prostate: Secondary | ICD-10-CM | POA: Diagnosis not present

## 2020-09-23 ENCOUNTER — Other Ambulatory Visit: Payer: Self-pay

## 2020-09-23 ENCOUNTER — Ambulatory Visit
Admission: RE | Admit: 2020-09-23 | Discharge: 2020-09-23 | Disposition: A | Discharge status: Home / Self Care | Payer: Medicare Other | Source: Ambulatory Visit | Attending: Radiation Oncology | Admitting: Radiation Oncology

## 2020-09-23 DIAGNOSIS — C61 Malignant neoplasm of prostate: Secondary | ICD-10-CM | POA: Diagnosis not present

## 2020-09-23 DIAGNOSIS — Z510 Encounter for antineoplastic radiation therapy: Secondary | ICD-10-CM | POA: Diagnosis not present

## 2020-09-24 ENCOUNTER — Other Ambulatory Visit: Payer: Self-pay

## 2020-09-24 ENCOUNTER — Ambulatory Visit
Admission: RE | Admit: 2020-09-24 | Discharge: 2020-09-24 | Disposition: A | Discharge status: Home / Self Care | Payer: Medicare Other | Source: Ambulatory Visit | Attending: Radiation Oncology | Admitting: Radiation Oncology

## 2020-09-24 DIAGNOSIS — C61 Malignant neoplasm of prostate: Secondary | ICD-10-CM | POA: Diagnosis not present

## 2020-09-24 DIAGNOSIS — Z510 Encounter for antineoplastic radiation therapy: Secondary | ICD-10-CM | POA: Diagnosis not present

## 2020-09-25 ENCOUNTER — Encounter: Payer: Self-pay | Admitting: Radiation Oncology

## 2020-09-25 ENCOUNTER — Ambulatory Visit
Admission: RE | Admit: 2020-09-25 | Discharge: 2020-09-25 | Disposition: A | Discharge status: Home / Self Care | Payer: Medicare Other | Source: Ambulatory Visit | Attending: Radiation Oncology | Admitting: Radiation Oncology

## 2020-09-25 ENCOUNTER — Other Ambulatory Visit: Payer: Self-pay

## 2020-09-25 DIAGNOSIS — C61 Malignant neoplasm of prostate: Secondary | ICD-10-CM | POA: Diagnosis not present

## 2020-09-25 DIAGNOSIS — Z510 Encounter for antineoplastic radiation therapy: Secondary | ICD-10-CM | POA: Diagnosis not present

## 2020-09-26 ENCOUNTER — Ambulatory Visit (INDEPENDENT_AMBULATORY_CARE_PROVIDER_SITE_OTHER): Payer: Medicare Other | Admitting: Podiatry

## 2020-09-26 ENCOUNTER — Encounter: Payer: Self-pay | Admitting: Podiatry

## 2020-09-26 DIAGNOSIS — L603 Nail dystrophy: Secondary | ICD-10-CM | POA: Diagnosis not present

## 2020-09-26 NOTE — Progress Notes (Signed)
He presents today for follow-up of his nail samples.  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  Assessment: Onychomycosis.  Plan: He wants to get started with laser therapy soon as possible.

## 2020-10-03 ENCOUNTER — Other Ambulatory Visit: Payer: Self-pay | Admitting: Internal Medicine

## 2020-10-03 ENCOUNTER — Ambulatory Visit
Admission: RE | Admit: 2020-10-03 | Discharge: 2020-10-03 | Disposition: A | Discharge status: Home / Self Care | Payer: Medicare Other | Source: Ambulatory Visit | Attending: Internal Medicine | Admitting: Internal Medicine

## 2020-10-03 DIAGNOSIS — C439 Malignant melanoma of skin, unspecified: Secondary | ICD-10-CM

## 2020-10-03 DIAGNOSIS — E7800 Pure hypercholesterolemia, unspecified: Secondary | ICD-10-CM | POA: Diagnosis not present

## 2020-10-03 DIAGNOSIS — C61 Malignant neoplasm of prostate: Secondary | ICD-10-CM | POA: Diagnosis not present

## 2020-10-03 DIAGNOSIS — D696 Thrombocytopenia, unspecified: Secondary | ICD-10-CM | POA: Diagnosis not present

## 2020-10-03 DIAGNOSIS — I5030 Unspecified diastolic (congestive) heart failure: Secondary | ICD-10-CM | POA: Diagnosis not present

## 2020-10-03 DIAGNOSIS — I4892 Unspecified atrial flutter: Secondary | ICD-10-CM | POA: Diagnosis not present

## 2020-10-03 DIAGNOSIS — E1140 Type 2 diabetes mellitus with diabetic neuropathy, unspecified: Secondary | ICD-10-CM | POA: Diagnosis not present

## 2020-10-03 DIAGNOSIS — I872 Venous insufficiency (chronic) (peripheral): Secondary | ICD-10-CM | POA: Diagnosis not present

## 2020-10-03 DIAGNOSIS — K219 Gastro-esophageal reflux disease without esophagitis: Secondary | ICD-10-CM | POA: Diagnosis not present

## 2020-10-03 DIAGNOSIS — R21 Rash and other nonspecific skin eruption: Secondary | ICD-10-CM | POA: Diagnosis not present

## 2020-10-14 ENCOUNTER — Ambulatory Visit (INDEPENDENT_AMBULATORY_CARE_PROVIDER_SITE_OTHER): Payer: Medicare Other | Admitting: Podiatrist

## 2020-10-14 ENCOUNTER — Other Ambulatory Visit: Payer: Self-pay

## 2020-10-14 ENCOUNTER — Encounter: Payer: Self-pay | Admitting: Podiatrist

## 2020-10-14 ENCOUNTER — Ambulatory Visit (INDEPENDENT_AMBULATORY_CARE_PROVIDER_SITE_OTHER): Payer: Medicare Other

## 2020-10-14 DIAGNOSIS — E1142 Type 2 diabetes mellitus with diabetic polyneuropathy: Secondary | ICD-10-CM | POA: Diagnosis not present

## 2020-10-14 DIAGNOSIS — B351 Tinea unguium: Secondary | ICD-10-CM

## 2020-10-14 DIAGNOSIS — L97521 Non-pressure chronic ulcer of other part of left foot limited to breakdown of skin: Secondary | ICD-10-CM

## 2020-10-14 DIAGNOSIS — L089 Local infection of the skin and subcutaneous tissue, unspecified: Secondary | ICD-10-CM | POA: Diagnosis not present

## 2020-10-14 DIAGNOSIS — L603 Nail dystrophy: Secondary | ICD-10-CM

## 2020-10-14 MED ORDER — CEPHALEXIN 500 MG PO CAPS: 500.0000 mg | ORAL_CAPSULE | Freq: Three times a day (TID) | ORAL | 0 refills | Status: DC

## 2020-10-14 MED ORDER — MUPIROCIN 2 % EX OINT: 1.0000 "application " | TOPICAL_OINTMENT | Freq: Two times a day (BID) | CUTANEOUS | 2 refills | Status: AC

## 2020-10-14 NOTE — Progress Notes (Signed)
Chief Complaint  Patient presents with  . Nail Problem    Left foot 4th toenail is discolored, swollen, red with drainage. Patient states he has cellulitis in that toe before and it is looking the same.      HPI: Patient is 75 y.o. male who presents today for the concerns as listed above. He is concerned as the toe has become swollen and red and has become infected in the past.  He relates seeing some drainage on the toenail as well.  He has neuropathy and doesn't feel pain in the toe. He denies any systemic signs of infection   Patient Active Problem List   Diagnosis Date Noted  . Allergic rhinitis 08/29/2020  . Benign prostatic hyperplasia 08/29/2020  . Chronic diastolic heart failure ([place]) 08/29/2020  . Congestive heart failure ([place]) 08/29/2020  . Diabetic peripheral neuropathy associated with type 2 diabetes mellitus ([place]) 08/29/2020  . Diastolic dysfunction 62/83/6629  . Elevated PSA 08/29/2020  . Melanoma ([place]) 08/29/2020  . Non-pressure chronic ulcer of other part of left foot with other specified severity ([place]) 08/29/2020  . Obesity 08/29/2020  . Osteoarthritis 08/29/2020  . Peripheral venous insufficiency 08/29/2020  . Pure hypercholesterolemia 08/29/2020  . Thrombocytopenia ([place]) 08/29/2020  . Unspecified thoracic, thoracolumbar and lumbosacral intervertebral disc disorder 08/29/2020  . Type 2 diabetes mellitus with foot ulcer (CODE) ([place]) 08/29/2020  . Malignant neoplasm of prostate ([place]) 05/19/2020  . Hypertension 01/18/2020  . Unspecified atrial flutter ([place]) 11/22/2019  . OSA (obstructive sleep apnea) 06/21/2016  . HLD (hyperlipidemia) 06/21/2016  . Streptococcal bacteremia   . Sepsis due to Streptococcus agalactiae ([place])   . Cellulitis of left leg   . Chronic right-sided CHF (congestive heart failure) ([place])   . Cellulitis of left heel   . Cough   . Cellulitis 05/29/2016  . Hypothyroidism 05/29/2016  . GERD (gastroesophageal reflux disease) 05/29/2016  .  Sepsis ([place]) 05/29/2016    Current Outpatient Medications on File Prior to Visit  Medication Sig Dispense Refill  . amoxicillin (AMOXIL) 500 MG capsule SMARTSIG:4 Capsule(s) By Mouth    . Cholecalciferol (VITAMIN D) 2000 UNITS tablet Take 2,000 Units by mouth every other day.     . diphenoxylate-atropine (LOMOTIL) 2.5-0.025 MG tablet Take 1-2 tablets by mouth 4 (four) times daily as needed for diarrhea or loose stools. 40 tablet 5  . famotidine (PEPCID) 20 MG tablet Take 20 mg by mouth daily as needed for heartburn.     . furosemide (LASIX) 40 MG tablet TAKE 1 TABLET BY MOUTH ONCE DAILY AS NEEDED FOR EDEMA 90 tablet 2  . ibuprofen (ADVIL) 200 MG tablet Take 400 mg by mouth in the morning.    Marland Kitchen Leuprolide Acetate (ELIGARD [place]) Inject into the skin.    Marland Kitchen levothyroxine (SYNTHROID, LEVOTHROID) 50 MCG tablet Take 50 mcg by mouth every other day. Alternates with 75 mcg strength    . levothyroxine (SYNTHROID, LEVOTHROID) 75 MCG tablet Take 75 mcg by mouth every other day. Alternates with 50 mcg strength    . metFORMIN (GLUCOPHAGE) 500 MG tablet Take 500 mg by mouth at bedtime.   2  . metoprolol tartrate (LOPRESSOR) 25 MG tablet 1 tablet with food    . Multiple Vitamin (MULTIVITAMIN WITH MINERALS) TABS Take 1 tablet by mouth every other day.     . Multiple Vitamins-Minerals (PRESERVISION AREDS 2 PO) Take 1 capsule by mouth 2 (two) times daily.    . Omega-3 Fatty Acids (FISH OIL) 1200 MG CAPS 1  capsule    . Potassium Chloride ER 20 MEQ TBCR 1 tab    . predniSONE (DELTASONE) 10 MG tablet Take 10 mg by mouth as directed.     . simvastatin (ZOCOR) 20 MG tablet Take 20 mg by mouth at bedtime.      No current facility-administered medications on file prior to visit.    No Known Allergies  Review of Systems No fevers, chills, nausea, muscle aches, no difficulty breathing, no calf pain, no chest pain or shortness of breath.   Physical Exam  GENERAL APPEARANCE: Alert, conversant. Appropriately  groomed. No acute distress.   VASCULAR: Pedal pulses palpable DP and PT bilateral.  Capillary refill time is immediate to all digits,  Proximal to distal cooling it warm to warm.  Digital perfusion adequate.   NEUROLOGIC: sensation is intact to 5.07 monofilament at 5/5 sites bilateral.  Light touch is intact bilateral, vibratory sensation intact bilateral  MUSCULOSKELETAL: acceptable muscle strength, tone and stability bilateral. Hammertoe contractures of the left foot noted  DERMATOLOGIC: left foot skin of the foot is swollen and red to the left fourth toe.  Generalized edema to the left foot is noted.  There is an area of ingrown skin overlying the medial aspect of the left fourth toenail.  Once removed, no pus or drainage is seen.  Superficial ulceration is seen.   Assessment     ICD-10-CM   1. Infection of toe  L08.9   2. 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 Diabetic polyneuropathy associated with type 2 diabetes mellitus ([place])  E11.42      Plan  - exam findings discussed with the patient - ulcer is debrided with tissue nippers to bleeding and viable tissue - antibiotic ointment and a dressing applied - rx for keflex and mupirocin ointment  - he will change the dressing daily at home - will return in a week for a recheck and will call sooner if any worsening of symptoms or concerns arise.

## 2020-10-14 NOTE — Progress Notes (Signed)
Patient presents today for the 1st laser treatment. Diagnosed with mycotic nail infection by Dr.Hyatt.   Toenail most affected 1-5 bilateral.  All other systems are negative.  Nails were filed thin. Laser therapy was administered to 1-5 bilateral toenails and patient tolerated the treatment well. All safety precautions were in place.    Follow up in 4 weeks for laser # 2.

## 2020-10-14 NOTE — Patient Instructions (Signed)

## 2020-10-14 NOTE — Patient Instructions (Signed)
Cleanse the toe and apply mupirocin ointment and a bandaid daily or twice daily.

## 2020-10-16 DIAGNOSIS — E119 Type 2 diabetes mellitus without complications: Secondary | ICD-10-CM | POA: Diagnosis not present

## 2020-10-16 DIAGNOSIS — H31092 Other chorioretinal scars, left eye: Secondary | ICD-10-CM | POA: Diagnosis not present

## 2020-10-16 DIAGNOSIS — H353131 Nonexudative age-related macular degeneration, bilateral, early dry stage: Secondary | ICD-10-CM | POA: Diagnosis not present

## 2020-10-16 DIAGNOSIS — Z961 Presence of intraocular lens: Secondary | ICD-10-CM | POA: Diagnosis not present

## 2020-10-23 ENCOUNTER — Encounter: Payer: Self-pay | Admitting: Podiatry

## 2020-10-23 ENCOUNTER — Other Ambulatory Visit: Payer: Self-pay

## 2020-10-23 ENCOUNTER — Ambulatory Visit (INDEPENDENT_AMBULATORY_CARE_PROVIDER_SITE_OTHER): Payer: Medicare Other | Admitting: Podiatry

## 2020-10-23 DIAGNOSIS — L97521 Non-pressure chronic ulcer of other part of left foot limited to breakdown of skin: Secondary | ICD-10-CM

## 2020-10-23 DIAGNOSIS — M2042 Other hammer toe(s) (acquired), left foot: Secondary | ICD-10-CM

## 2020-10-23 DIAGNOSIS — E1142 Type 2 diabetes mellitus with diabetic polyneuropathy: Secondary | ICD-10-CM | POA: Diagnosis not present

## 2020-10-23 NOTE — Progress Notes (Signed)
Subjective:   Patient ID: Matthew Robinson, male   DOB: 75 y.o.   MRN: [id number]   HPI Patient presents stating that he is seems to be doing better but he needed to have this checked and he is finishing his antibiotic now   ROS      Objective:  Physical Exam  Neurovascular status unchanged with significant neuropathy condition with patient found to have well-healed fourth digit left medial side with ulceration which has improved with crusted dorsal tissue but no subcutaneous exposure no proximal edema erythema noted     Assessment:  Ulceration fourth digit left improving with conservative care     Plan:  Advised on continued soaks padding usage and finishing antibiotics.  Patient will continue open toed shoes will be seen back as needed

## 2020-10-30 ENCOUNTER — Ambulatory Visit: Payer: Self-pay | Admitting: Urology

## 2020-11-04 ENCOUNTER — Telehealth: Payer: Self-pay

## 2020-11-04 NOTE — Telephone Encounter (Signed)
Left patient a voicemail message in regards to telephone appointment with Freeman Caldron PA on 11/13/20 @ 8:30am. Called to review meaningful use, AUA and questions. TM

## 2020-11-06 ENCOUNTER — Encounter: Payer: Self-pay | Admitting: Urology

## 2020-11-06 ENCOUNTER — Telehealth: Payer: Self-pay

## 2020-11-06 ENCOUNTER — Other Ambulatory Visit: Payer: Self-pay

## 2020-11-06 NOTE — Progress Notes (Signed)
Patient states nocturia 2 times per night. Patient denies dysuria or hematuria. Patient states that he is emptying his bladder completely. Patient states urine stream is moderate and steady. Patient states urgency and is able to make it to the bathroom. Patient denies leakage. Patient denies having to push to start urine flow. Patient states fatigue is better since radiation treatment. Patient states bowel movements are not full when they are formed. Patient states bowel movements are thin and ribbon like since radiation treatment. Patient states that he has a follow-up with [facility] next month in May.

## 2020-11-06 NOTE — Telephone Encounter (Signed)
Patient has telephone follow-up appointment with Freeman Caldron PA on 11/13/20 @ 8:30am. Patient verbalized understanding of appointment date and time. Reviewed meaningful use, AUA and prostate questions. TM

## 2020-11-07 ENCOUNTER — Telehealth: Payer: Self-pay | Admitting: *Deleted

## 2020-11-07 NOTE — Telephone Encounter (Signed)
Called patient to alter telephone fu on 11-13-20 due to Matthew B. having an appt. @ 8:30 am that day, moved appt. to 11:30 am , lvm for a return call

## 2020-11-08 DIAGNOSIS — Z23 Encounter for immunization: Secondary | ICD-10-CM | POA: Diagnosis not present

## 2020-11-11 NOTE — Progress Notes (Signed)
  Radiation Oncology         [phone] ________________________________  Name: Matthew Robinson MRN: [id number]  Date: 09/25/2020  DOB: [birth year]  End of Treatment Note  Diagnosis:   75 y.o. gentleman with Stage T1c adenocarcinoma of the prostate with Gleason score of 4+4, and PSA of 18.     Indication for treatment:  Curative, Definitive Radiotherapy       Radiation treatment dates:   1/20-3/16/22  Site/dose:  1. The prostate, seminal vesicles, and pelvic lymph nodes were initially treated to 45 Gy in 25 fractions of 1.8 Gy  2. The prostate only was boosted to 75 Gy with 15 additional fractions of 2.0 Gy   Beams/energy:  1. The prostate, seminal vesicles, and pelvic lymph nodes were initially treated using VMAT intensity modulated radiotherapy delivering 6 megavolt photons. Image guidance was performed with CB-CT studies prior to each fraction. He was immobilized with a body fix lower extremity mold.  2. the prostate only was boosted using VMAT intensity modulated radiotherapy delivering 6 megavolt photons. Image guidance was performed with CB-CT studies prior to each fraction. He was immobilized with a body fix lower extremity mold.  Narrative: The patient tolerated radiation treatment relatively well.   The patient experienced some minor urinary irritation and modest fatigue.    Plan: The patient has completed radiation treatment. He will return to radiation oncology clinic for routine followup in one month. I advised him to call or return sooner if he has any questions or concerns related to his recovery or treatment. ________________________________  Sheral Apley. Tammi Klippel, M.D.

## 2020-11-12 ENCOUNTER — Telehealth: Payer: Self-pay

## 2020-11-12 NOTE — Telephone Encounter (Signed)
Left voice mail reminding patient of his telephone visit with Ashlyn Bruning tomorrow.

## 2020-11-13 ENCOUNTER — Ambulatory Visit
Admission: RE | Admit: 2020-11-13 | Discharge: 2020-11-13 | Disposition: A | Discharge status: Home / Self Care | Payer: Medicare Other | Source: Ambulatory Visit | Attending: Urology | Admitting: Urology

## 2020-11-13 ENCOUNTER — Other Ambulatory Visit: Payer: Self-pay

## 2020-11-13 DIAGNOSIS — C61 Malignant neoplasm of prostate: Secondary | ICD-10-CM

## 2020-11-13 NOTE — Progress Notes (Signed)
Radiation Oncology         [phone] ________________________________  Name: RIEN MARLAND MRN: [id number]  Date: 11/13/2020  DOB: October 18, 1945  Post Treatment Note  CC: Wenda Low, MD  Alexis Frock, MD  Diagnosis:   75 y.o. gentleman with Stage T1c adenocarcinoma of the prostate with Gleason score of 4+4, and PSA of 18.   Interval Since Last Radiation:  7 weeks concurrent with ADT (ADT started 05/21/21) 1/20-3/16/22: 1. The prostate, seminal vesicles, and pelvic lymph nodes were initially treated to 45 Gy in 25 fractions of 1.8 Gy  2. The prostate only was boosted to 75 Gy with 15 additional fractions of 2.0 Gy   Narrative:  I spoke with the patient to conduct his routine scheduled 1 month follow up visit via telephone to spare the patient unnecessary potential exposure in the healthcare setting during the current COVID-19 pandemic.  The patient was notified in advance and gave permission to proceed with this visit format.  He tolerated radiation treatment relatively well with only minor urinary irritation and modest fatigue. He did experience nocturia 2x/night, hesitancy and loose bowel movements which improved with Lomotil in the mornings.                              On review of systems, the patient states that he is doing well in general.  He continues with nocturia x2 and urgency but denies incontinence and feels that he empties his bladder well on voiding with a good flow of stream.  He specifically denies dysuria, gross hematuria, straining to void, incomplete bladder emptying or incontinence.  He reports a healthy appetite and is maintaining his weight.  He denies abdominal pain, nausea, vomiting, diarrhea or constipation and notes that his bowels are improving but are not quite back to normal at this point.  He continues to tolerate the ADT fairly well, fatigue is improving and overall, he is quite pleased with his progress to date.  ALLERGIES:  has No Known  Allergies.  Meds: Current Outpatient Medications  Medication Sig Dispense Refill  . amoxicillin (AMOXIL) 500 MG capsule SMARTSIG:4 Capsule(s) By Mouth    . cephALEXin (KEFLEX) 500 MG capsule Take 1 capsule (500 mg total) by mouth 3 (three) times daily. 30 capsule 0  . Cholecalciferol (VITAMIN D) 2000 UNITS tablet Take 2,000 Units by mouth every other day.     . diphenoxylate-atropine (LOMOTIL) 2.5-0.025 MG tablet Take 1-2 tablets by mouth 4 (four) times daily as needed for diarrhea or loose stools. 40 tablet 5  . famotidine (PEPCID) 20 MG tablet Take 20 mg by mouth daily as needed for heartburn.     . furosemide (LASIX) 40 MG tablet TAKE 1 TABLET BY MOUTH ONCE DAILY AS NEEDED FOR EDEMA 90 tablet 2  . ibuprofen (ADVIL) 200 MG tablet Take 400 mg by mouth in the morning.    Marland Kitchen Leuprolide Acetate (ELIGARD [place]) Inject into the skin.    Marland Kitchen levothyroxine (SYNTHROID, LEVOTHROID) 50 MCG tablet Take 50 mcg by mouth every other day. Alternates with 75 mcg strength    . levothyroxine (SYNTHROID, LEVOTHROID) 75 MCG tablet Take 75 mcg by mouth every other day. Alternates with 50 mcg strength    . metFORMIN (GLUCOPHAGE) 500 MG tablet Take 500 mg by mouth at bedtime.   2  . metoprolol tartrate (LOPRESSOR) 25 MG tablet 1 tablet with food    . Multiple Vitamin (MULTIVITAMIN WITH MINERALS) TABS  Take 1 tablet by mouth every other day.     . Multiple Vitamins-Minerals (PRESERVISION AREDS 2 PO) Take 1 capsule by mouth 2 (two) times daily.    . mupirocin ointment (BACTROBAN) 2 % Apply 1 application topically 2 (two) times daily. 30 g 2  . Omega-3 Fatty Acids (FISH OIL) 1200 MG CAPS 1 capsule    . Potassium Chloride ER 20 MEQ TBCR 1 tab    . predniSONE (DELTASONE) 10 MG tablet Take 10 mg by mouth as directed.     . simvastatin (ZOCOR) 20 MG tablet Take 20 mg by mouth at bedtime.      No current facility-administered medications for this encounter.    Physical Findings:  vitals were not taken for this visit.    /Unable to assess due to telephone follow-up visit format.  Lab Findings: Lab Results  Component Value Date   WBC 6.7 11/17/2019   HGB 15.5 11/17/2019   HCT 46.3 11/17/2019   MCV 93 11/17/2019   PLT 168 11/17/2019     Radiographic Findings: No results found.  Impression/Plan: 1. 75 y.o. gentleman with Stage T1c adenocarcinoma of the prostate with Gleason score of 4+4, and PSA of 18.  He will continue to follow up with urology for ongoing PSA determinations and has an appointment scheduled with Dr. Tresa Moore later this month for repeat labs and his next ADT injection. He understands what to expect with regards to PSA monitoring going forward. I will look forward to following his response to treatment via correspondence with urology, and would be happy to continue to participate in his care if clinically indicated. I talked to the patient about what to expect in the future, including his risk for erectile dysfunction and rectal bleeding. I encouraged him to call or return to the office if he has any questions regarding his previous radiation or possible radiation side effects. He was comfortable with this plan and will follow up as needed.    Nicholos Johns, PA-C

## 2020-11-15 ENCOUNTER — Ambulatory Visit: Payer: Medicare Other

## 2020-11-15 ENCOUNTER — Other Ambulatory Visit: Payer: Self-pay

## 2020-11-15 DIAGNOSIS — L603 Nail dystrophy: Secondary | ICD-10-CM

## 2020-11-15 DIAGNOSIS — B351 Tinea unguium: Secondary | ICD-10-CM

## 2020-11-15 NOTE — Patient Instructions (Signed)

## 2020-11-15 NOTE — Progress Notes (Signed)
Patient presents today for the 2nd laser treatment. Diagnosed with mycotic nail infection by Dr.Hyatt.   Toenail most affected 1-5 bilateral.  All other systems are negative.  Nails were filed thin. Laser therapy was administered to 1-5 bilateral toenails and patient tolerated the treatment well. All safety precautions were in place.    Follow up in 4 weeks for laser # 3.

## 2020-11-25 DIAGNOSIS — C61 Malignant neoplasm of prostate: Secondary | ICD-10-CM | POA: Diagnosis not present

## 2020-12-02 DIAGNOSIS — C61 Malignant neoplasm of prostate: Secondary | ICD-10-CM | POA: Diagnosis not present

## 2020-12-10 ENCOUNTER — Other Ambulatory Visit: Payer: Self-pay

## 2020-12-10 ENCOUNTER — Encounter: Payer: Self-pay | Admitting: Internal Medicine

## 2020-12-10 ENCOUNTER — Ambulatory Visit (INDEPENDENT_AMBULATORY_CARE_PROVIDER_SITE_OTHER): Payer: Medicare Other | Admitting: Internal Medicine

## 2020-12-10 VITALS — BP 102/68 | HR 84 | Ht 78.0 in | Wt 272.6 lb

## 2020-12-10 DIAGNOSIS — I10 Essential (primary) hypertension: Secondary | ICD-10-CM

## 2020-12-10 NOTE — Patient Instructions (Addendum)
Medication Instructions:  °Your physician recommends that you continue on your current medications as directed. Please refer to the Current Medication list given to you today. ° °Labwork: °None ordered. ° °Testing/Procedures: °None ordered. ° °Follow-Up: °Your physician wants you to follow-up in: 6 months with Gregg Taylor, MD  ° ° °Any Other Special Instructions Will Be Listed Below (If Applicable). ° °If you need a refill on your cardiac medications before your next appointment, please call your pharmacy.  ° ° ° ° °

## 2020-12-10 NOTE — Progress Notes (Signed)
HPI Matthew Robinson returns today for followup of atrial flutter. He is a pleasant 75 yo man with a h/o atrial flutter s/p catheter ablation almost a year ago. He has done well from a cardiac perspsective. He has been diagnosed with prostate CA and his Gleason score was 8. He has had XRT and is in the middle of hormonal therapy. He denies palpitations, sob or chest pain. He gets tired walking. He denies syncope.  No Known Allergies   Current Outpatient Medications  Medication Sig Dispense Refill  . amoxicillin (AMOXIL) 500 MG capsule SMARTSIG:4 Capsule(s) By Mouth    . cephALEXin (KEFLEX) 500 MG capsule Take 1 capsule (500 mg total) by mouth 3 (three) times daily. 30 capsule 0  . Cholecalciferol (VITAMIN D) 2000 UNITS tablet Take 2,000 Units by mouth every other day.     . diphenoxylate-atropine (LOMOTIL) 2.5-0.025 MG tablet Take 1-2 tablets by mouth 4 (four) times daily as needed for diarrhea or loose stools. 40 tablet 5  . famotidine (PEPCID) 20 MG tablet Take 20 mg by mouth daily as needed for heartburn.     . furosemide (LASIX) 40 MG tablet TAKE 1 TABLET BY MOUTH ONCE DAILY AS NEEDED FOR EDEMA 90 tablet 2  . ibuprofen (ADVIL) 200 MG tablet Take 400 mg by mouth in the morning.    Marland Kitchen Leuprolide Acetate (ELIGARD [place]) Inject into the skin.    Marland Kitchen levothyroxine (SYNTHROID, LEVOTHROID) 50 MCG tablet Take 50 mcg by mouth every other day. Alternates with 75 mcg strength    . levothyroxine (SYNTHROID, LEVOTHROID) 75 MCG tablet Take 75 mcg by mouth every other day. Alternates with 50 mcg strength    . metFORMIN (GLUCOPHAGE) 500 MG tablet Take 500 mg by mouth at bedtime.   2  . metoprolol succinate (TOPROL-XL) 25 MG 24 hr tablet Take 1 tablet by mouth daily.    . mupirocin ointment (BACTROBAN) 2 % Apply 1 application topically 2 (two) times daily. 30 g 2  . Potassium Chloride ER 20 MEQ TBCR 1 tab    . predniSONE (DELTASONE) 10 MG tablet Take 10 mg by mouth as directed.     . simvastatin (ZOCOR) 20 MG  tablet Take 20 mg by mouth at bedtime.      No current facility-administered medications for this visit.     Past Medical History:  Diagnosis Date  . Chronic right-sided CHF (congestive heart failure) ([place])    Echo 11/17: EF 55 and 60, normal wall motion, trivial AI, moderate RVE, mildly reduced RVSF, moderate to severe RAE // Echo 6/18: EF 60-65, Gr 1 DD, trivial AI, mild RVE, mild RAE.  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 Diabetic peripheral neuropathy associated with type 2 diabetes mellitus ([place]) 08/29/2020  . Glucose intolerance (impaired glucose tolerance)   . Hyperlipemia   . Melanoma of skin ([place])    melanoma left shoulder. MOHS x 3.   . Mild aortic insufficiency   . Mitral valve prolapse    hx of - seen years ago by Dr Enid Derry in [facility], [place] - not seen on recent echoes  . Prostate cancer ([place])   . Sleep apnea    cpap 16    ROS:   All systems reviewed and negative except as noted in the HPI.   Past Surgical History:  Procedure Laterality Date  . A-FLUTTER ABLATION N/A 12/14/2019   Procedure: A-FLUTTER ABLATION;  Surgeon: Evans Lance, MD;  Location: [place] [facility];  Service: Cardiovascular;  Laterality: N/A;  . CATARACT EXTRACTION Left   . COLONOSCOPY     polyps in past  . COLONOSCOPY WITH PROPOFOL N/A 08/14/2014   Procedure: COLONOSCOPY WITH PROPOFOL;  Surgeon: Garlan Fair, MD;  Location: [facility];  Service: Endoscopy;  Laterality: N/A;  . MELANOMA EXCISION    . MENISCUS REPAIR Left 1970  . RETINAL DETACHMENT SURGERY    . RIGHT HEART CATH N/A 05/26/2018   Procedure: RIGHT HEART CATH;  Surgeon: Larey Dresser, MD;  Location: [place] [facility];  Service: Cardiovascular;  Laterality: N/A;  . SPINE SURGERY     4'14 lumbar 3,4,5 fusion([place])  . TONSILLECTOMY       Family History  Problem Relation Age of Onset  . Heart failure Mother   . Heart disease Mother   . Atrial  fibrillation Mother   . Mitral valve prolapse Mother   . Breast cancer Mother   . Hypertension Father   . Heart attack Father        died from MI during CA surgery  . Cancer Father   . Lymphoma Father   . Cancer Maternal Grandfather   . Prostate cancer Neg Hx   . Colon cancer Neg Hx   . Pancreatic cancer Neg Hx      Social History   Socioeconomic History  . Marital status: Married    Spouse name: Not on file  . Number of children: Not on file  . Years of education: Not on file  . Highest education level: Not on file  Occupational History  . Not on file  Tobacco Use  . Smoking status: Never Smoker  . Smokeless tobacco: Never Used  Vaping Use  . Vaping Use: Never used  Substance and Sexual Activity  . Alcohol use: Yes    Alcohol/week: 1.0 - 3.0 standard drink    Types: 1 - 3 Glasses of wine per week    Comment: x2 weekly   . Drug use: No  . Sexual activity: Yes  Other Topics Concern  . Not on file  Social History Narrative   Married   1 daughter - deceased   Software engineer - retired; works a little   Social Determinants of Radio broadcast assistant Strain: Not on Art therapist Insecurity: Not on file  Transportation Needs: Not on file  Physical Activity: Not on file  Stress: Not on file  Social Connections: Not on file  Intimate Partner Violence: Not on file     BP 102/68   Pulse 84   Ht 6\' 6"  (1.981 m)   Wt 272 lb 9.6 oz (123.7 kg)   SpO2 93%   BMI 31.50 kg/m   Physical Exam:  Well appearing NAD HEENT: Unremarkable Neck:  No JVD, no thyromegally Lymphatics:  No adenopathy Back:  No CVA tenderness Lungs:  Clear with no wheezes HEART:  Regular rate rhythm, no murmurs, no rubs, no clicks Abd:  soft, positive bowel sounds, no organomegally, no rebound, no guarding Ext:  2 plus pulses, no edema, no cyanosis, no clubbing Skin:  No rashes no nodules Neuro:  CN II through XII intact, motor grossly intact   Assess/Plan: 1. Atrial flutter - he is s/p  ablation with no symptoms of recurrence. He denies palpitations. 2. HTN - his bp is well controlled. No change in his meds.  Carleene Overlie Adalind Weitz,MD

## 2020-12-13 ENCOUNTER — Other Ambulatory Visit: Payer: Self-pay

## 2020-12-13 ENCOUNTER — Ambulatory Visit: Payer: Medicare Other

## 2020-12-13 DIAGNOSIS — B351 Tinea unguium: Secondary | ICD-10-CM

## 2020-12-13 DIAGNOSIS — L603 Nail dystrophy: Secondary | ICD-10-CM

## 2020-12-13 NOTE — Progress Notes (Signed)
Patient presents today for the 3rd laser treatment. Diagnosed with mycotic nail infection by Dr.Hyatt.   Toenail most affected 1-5 bilateral.  All other systems are negative.  Nails were filed thin. Laser therapy was administered to 1-5 bilateral toenails and patient tolerated the treatment well. All safety precautions were in place.    Follow up in 4 weeks for laser # 4.

## 2021-01-02 DIAGNOSIS — Z20822 Contact with and (suspected) exposure to covid-19: Secondary | ICD-10-CM | POA: Diagnosis not present

## 2021-01-17 ENCOUNTER — Other Ambulatory Visit: Payer: Self-pay

## 2021-01-17 ENCOUNTER — Ambulatory Visit (INDEPENDENT_AMBULATORY_CARE_PROVIDER_SITE_OTHER): Payer: Self-pay

## 2021-01-17 DIAGNOSIS — L603 Nail dystrophy: Secondary | ICD-10-CM

## 2021-01-17 DIAGNOSIS — B351 Tinea unguium: Secondary | ICD-10-CM

## 2021-01-17 NOTE — Progress Notes (Signed)
Patient presents today for the 4th laser treatment. Diagnosed with mycotic nail infection by Dr.Hyatt.   Toenail most affected 1-5 bilateral.  All other systems are negative.  Nails were filed thin. Laser therapy was administered to 1-5 bilateral toenails and patient tolerated the treatment well. All safety precautions were in place.    Follow up in 4 weeks for laser # 5.

## 2021-01-21 DIAGNOSIS — U071 COVID-19: Secondary | ICD-10-CM | POA: Diagnosis not present

## 2021-02-07 DIAGNOSIS — Z85828 Personal history of other malignant neoplasm of skin: Secondary | ICD-10-CM | POA: Diagnosis not present

## 2021-02-07 DIAGNOSIS — L821 Other seborrheic keratosis: Secondary | ICD-10-CM | POA: Diagnosis not present

## 2021-02-07 DIAGNOSIS — D692 Other nonthrombocytopenic purpura: Secondary | ICD-10-CM | POA: Diagnosis not present

## 2021-02-07 DIAGNOSIS — Z85820 Personal history of malignant melanoma of skin: Secondary | ICD-10-CM | POA: Diagnosis not present

## 2021-02-07 DIAGNOSIS — L570 Actinic keratosis: Secondary | ICD-10-CM | POA: Diagnosis not present

## 2021-02-14 ENCOUNTER — Other Ambulatory Visit: Payer: Self-pay

## 2021-02-14 ENCOUNTER — Ambulatory Visit (INDEPENDENT_AMBULATORY_CARE_PROVIDER_SITE_OTHER): Payer: Self-pay | Admitting: *Deleted

## 2021-02-14 DIAGNOSIS — L603 Nail dystrophy: Secondary | ICD-10-CM

## 2021-02-14 DIAGNOSIS — B351 Tinea unguium: Secondary | ICD-10-CM

## 2021-02-14 NOTE — Progress Notes (Signed)
Patient presents today for the 5th laser treatment. Diagnosed with mycotic nail infection by Dr.Hyatt.   Toenail most affected 1-5 bilateral. He says the nails are looking a little better. The left hallux nail is completely detached from the nail bed.  All other systems are negative.  Nails were filed thin. Laser therapy was administered to 1-5 bilateral toenails and patient tolerated the treatment well. All safety precautions were in place.    Follow up in 8 weeks for laser # 6

## 2021-02-17 ENCOUNTER — Other Ambulatory Visit: Payer: Medicare Other

## 2021-03-04 ENCOUNTER — Other Ambulatory Visit: Payer: Self-pay | Admitting: Internal Medicine

## 2021-03-10 DIAGNOSIS — M25562 Pain in left knee: Secondary | ICD-10-CM | POA: Diagnosis not present

## 2021-04-18 ENCOUNTER — Other Ambulatory Visit: Payer: Self-pay

## 2021-04-18 ENCOUNTER — Ambulatory Visit (INDEPENDENT_AMBULATORY_CARE_PROVIDER_SITE_OTHER): Payer: Medicare Other

## 2021-04-18 DIAGNOSIS — L603 Nail dystrophy: Secondary | ICD-10-CM

## 2021-04-18 DIAGNOSIS — B351 Tinea unguium: Secondary | ICD-10-CM

## 2021-04-18 NOTE — Progress Notes (Signed)
Patient presents today for the 6th laser treatment. Diagnosed with mycotic nail infection by Dr.Hyatt.   Toenail most affected 1-5 bilateral. He says the nails are looking a little better. The left hallux nail is completely detached from the nail bed.  All other systems are negative.  Nails were filed thin. Laser therapy was administered to 1-5 bilateral toenails and patient tolerated the treatment well. All safety precautions were in place.    Patient has completed the recommended laser treatments. He will follow up with Dr. Milinda Pointer in 3 months to evaluate progress.

## 2021-04-18 NOTE — Patient Instructions (Signed)

## 2021-04-22 DIAGNOSIS — I5030 Unspecified diastolic (congestive) heart failure: Secondary | ICD-10-CM | POA: Diagnosis not present

## 2021-04-22 DIAGNOSIS — Z0000 Encounter for general adult medical examination without abnormal findings: Secondary | ICD-10-CM | POA: Diagnosis not present

## 2021-04-22 DIAGNOSIS — E039 Hypothyroidism, unspecified: Secondary | ICD-10-CM | POA: Diagnosis not present

## 2021-04-22 DIAGNOSIS — Z1389 Encounter for screening for other disorder: Secondary | ICD-10-CM | POA: Diagnosis not present

## 2021-04-22 DIAGNOSIS — G4730 Sleep apnea, unspecified: Secondary | ICD-10-CM | POA: Diagnosis not present

## 2021-04-22 DIAGNOSIS — C439 Malignant melanoma of skin, unspecified: Secondary | ICD-10-CM | POA: Diagnosis not present

## 2021-04-22 DIAGNOSIS — D696 Thrombocytopenia, unspecified: Secondary | ICD-10-CM | POA: Diagnosis not present

## 2021-04-22 DIAGNOSIS — I872 Venous insufficiency (chronic) (peripheral): Secondary | ICD-10-CM | POA: Diagnosis not present

## 2021-04-22 DIAGNOSIS — E1140 Type 2 diabetes mellitus with diabetic neuropathy, unspecified: Secondary | ICD-10-CM | POA: Diagnosis not present

## 2021-04-22 DIAGNOSIS — I4892 Unspecified atrial flutter: Secondary | ICD-10-CM | POA: Diagnosis not present

## 2021-04-22 DIAGNOSIS — Z23 Encounter for immunization: Secondary | ICD-10-CM | POA: Diagnosis not present

## 2021-04-22 DIAGNOSIS — E7800 Pure hypercholesterolemia, unspecified: Secondary | ICD-10-CM | POA: Diagnosis not present

## 2021-04-22 DIAGNOSIS — C61 Malignant neoplasm of prostate: Secondary | ICD-10-CM | POA: Diagnosis not present

## 2021-04-23 DIAGNOSIS — H353131 Nonexudative age-related macular degeneration, bilateral, early dry stage: Secondary | ICD-10-CM | POA: Diagnosis not present

## 2021-04-23 DIAGNOSIS — Z961 Presence of intraocular lens: Secondary | ICD-10-CM | POA: Diagnosis not present

## 2021-04-23 DIAGNOSIS — D3131 Benign neoplasm of right choroid: Secondary | ICD-10-CM | POA: Diagnosis not present

## 2021-04-23 DIAGNOSIS — E1165 Type 2 diabetes mellitus with hyperglycemia: Secondary | ICD-10-CM | POA: Diagnosis not present

## 2021-04-23 DIAGNOSIS — H11442 Conjunctival cysts, left eye: Secondary | ICD-10-CM | POA: Diagnosis not present

## 2021-04-25 DIAGNOSIS — Z23 Encounter for immunization: Secondary | ICD-10-CM | POA: Diagnosis not present

## 2021-06-09 ENCOUNTER — Other Ambulatory Visit: Payer: Self-pay | Admitting: Urology

## 2021-06-09 DIAGNOSIS — C61 Malignant neoplasm of prostate: Secondary | ICD-10-CM | POA: Diagnosis not present

## 2021-06-11 NOTE — Telephone Encounter (Signed)
Further prescriptions for Lomotil should be requested through his PCP or his urologist, Dr. Tresa Moore since the patient is no longer under our care.

## 2021-06-13 ENCOUNTER — Other Ambulatory Visit: Payer: Self-pay | Admitting: Urology

## 2021-06-16 DIAGNOSIS — C61 Malignant neoplasm of prostate: Secondary | ICD-10-CM | POA: Diagnosis not present

## 2021-07-20 ENCOUNTER — Other Ambulatory Visit: Payer: Self-pay | Admitting: Internal Medicine

## 2021-07-22 ENCOUNTER — Encounter: Payer: Self-pay | Admitting: Internal Medicine

## 2021-07-22 ENCOUNTER — Encounter: Payer: Self-pay | Admitting: Podiatry

## 2021-07-22 ENCOUNTER — Ambulatory Visit (INDEPENDENT_AMBULATORY_CARE_PROVIDER_SITE_OTHER): Payer: Medicare Other | Admitting: Podiatry

## 2021-07-22 ENCOUNTER — Ambulatory Visit (INDEPENDENT_AMBULATORY_CARE_PROVIDER_SITE_OTHER): Payer: Medicare Other | Admitting: Internal Medicine

## 2021-07-22 ENCOUNTER — Other Ambulatory Visit: Payer: Self-pay

## 2021-07-22 VITALS — BP 118/68 | HR 84 | Ht 78.0 in | Wt 282.0 lb

## 2021-07-22 DIAGNOSIS — E1142 Type 2 diabetes mellitus with diabetic polyneuropathy: Secondary | ICD-10-CM

## 2021-07-22 DIAGNOSIS — I4892 Unspecified atrial flutter: Secondary | ICD-10-CM | POA: Diagnosis not present

## 2021-07-22 DIAGNOSIS — M79676 Pain in unspecified toe(s): Secondary | ICD-10-CM

## 2021-07-22 DIAGNOSIS — B351 Tinea unguium: Secondary | ICD-10-CM

## 2021-07-22 DIAGNOSIS — I10 Essential (primary) hypertension: Secondary | ICD-10-CM

## 2021-07-22 NOTE — Progress Notes (Signed)
HPI [name] returns today for followup. He is a pleasant 76 yo man with a h/o atrial flutter s/p catheter ablation. He has done well in the interim. No palpitations. He has prostate CA and has undergone XRT. He has undergone hormonal therapy. He still walks some but gets tired. No syncope.  No Known Allergies   Current Outpatient Medications  Medication Sig Dispense Refill   amoxicillin (AMOXIL) 500 MG capsule SMARTSIG:4 Capsule(s) By Mouth     Cholecalciferol (VITAMIN D) 2000 UNITS tablet Take 2,000 Units by mouth every other day.      diphenoxylate-atropine (LOMOTIL) 2.5-0.025 MG tablet Take 1-2 tablets by mouth 4 (four) times daily as needed for diarrhea or loose stools. 40 tablet 5   famotidine (PEPCID) 20 MG tablet Take 20 mg by mouth daily as needed for heartburn.      furosemide (LASIX) 40 MG tablet Take 1 tablet (40 mg total) by mouth daily. Pt needs to keep upcoming appt in Jan, 2023 for further refills 90 tablet 1   ibuprofen (ADVIL) 200 MG tablet Take 400 mg by mouth in the morning.     Leuprolide Acetate (ELIGARD [place]) Inject into the skin.     levothyroxine (SYNTHROID, LEVOTHROID) 50 MCG tablet Take 50 mcg by mouth every other day. Alternates with 75 mcg strength     levothyroxine (SYNTHROID, LEVOTHROID) 75 MCG tablet Take 75 mcg by mouth every other day. Alternates with 50 mcg strength     metFORMIN (GLUCOPHAGE) 500 MG tablet Take 500 mg by mouth at bedtime.   2   metoprolol succinate (TOPROL-XL) 25 MG 24 hr tablet TAKE 1 TABLET BY MOUTH ONCE DAILY WITH A MEAL OR IMMEDIATELY FOLLOWING A MEAL. 90 tablet 3   mupirocin ointment (BACTROBAN) 2 % Apply 1 application topically 2 (two) times daily. 30 g 2   Potassium Chloride ER 20 MEQ TBCR 1 tab     predniSONE (DELTASONE) 10 MG tablet Take 10 mg by mouth as directed.      simvastatin (ZOCOR) 20 MG tablet Take 20 mg by mouth at bedtime.      No current facility-administered medications for this visit.     Past Medical  History:  Diagnosis Date   Chronic right-sided CHF (congestive heart failure) ([place])    Echo 11/17: EF 55 and 60, normal wall motion, trivial AI, moderate RVE, mildly reduced RVSF, moderate to severe RAE // Echo 6/18: EF 60-65, Gr 1 DD, trivial AI, mild RVE, mild RAE.   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 MOHS x 3.    Mild aortic insufficiency    Mitral valve prolapse    hx of - seen years ago by Dr Matthew Robinson in [facility], [place] - not seen on recent echoes   Prostate cancer [facility])    Sleep apnea    cpap 16    ROS:   All systems reviewed and negative except as noted in the HPI.   Past Surgical History:  Procedure Laterality Date   A-FLUTTER ABLATION N/A 12/14/2019   Procedure: A-FLUTTER ABLATION;  Surgeon: Matthew Lance, MD;  Location: [place] [facility];  Service: Cardiovascular;  Laterality: N/A;   CATARACT EXTRACTION Left  COLONOSCOPY     polyps in past   COLONOSCOPY WITH PROPOFOL N/A 08/14/2014   Procedure: COLONOSCOPY WITH PROPOFOL;  Surgeon: Matthew Fair, MD;  [facility];  Service: Endoscopy;  Laterality: N/A;   MELANOMA EXCISION     MENISCUS REPAIR Left 1970   RETINAL DETACHMENT SURGERY     RIGHT HEART CATH N/A 05/26/2018   Procedure: RIGHT HEART CATH;  Surgeon: Matthew Dresser, MD;  Location: [place] [facility];  Service: Cardiovascular;  Laterality: N/A;   SPINE SURGERY     4'14 lumbar 3,4,5 fusion([place])   TONSILLECTOMY       Family History  Problem Relation Age of Onset   Heart failure Mother    Heart disease Mother    Atrial fibrillation Mother    Mitral valve prolapse Mother    Breast cancer Mother    Hypertension Father    Heart attack Father        died from MI during CA  surgery   Cancer Father    Lymphoma Father    Cancer Maternal Grandfather    Prostate cancer Neg Hx    Colon cancer Neg Hx    Pancreatic cancer Neg Hx      Social History   Socioeconomic History   Marital status: Married    Spouse name: Not on file   Number of children: Not on file   Years of education: Not on file   Highest education level: Not on file  Occupational History   Not on file  Tobacco Use   Smoking status: Never   Smokeless tobacco: Never  Vaping Use   Vaping Use: Never used  Substance and Sexual Activity   Alcohol use: Yes    Alcohol/week: 1.0 - 3.0 standard drink    Types: 1 - 3 Glasses of wine per week    Comment: x2 weekly    Drug use: No   Sexual activity: Yes  Other Topics Concern   Not on file  Social History Narrative   Married   1 daughter - deceased   Software engineer - retired; works a little   Investment banker, operational of Radio broadcast assistant Strain: Not on Art therapist Insecurity: Not on file  Transportation Needs: Not on file  Physical Activity: Not on file  Stress: Not on file  Social Connections: Not on file  Intimate Partner Violence: Not on file     BP 118/68    Pulse 84    Ht 6\' 6"  (1.981 m)    Wt 282 lb (127.9 kg)    SpO2 94%    BMI 32.59 kg/m   Physical Exam:  Well appearing NAD HEENT: Unremarkable Neck:  No JVD, no thyromegally Lymphatics:  No adenopathy Back:  No CVA tenderness Lungs:  Clear with no wheezes HEART:  Regular rate rhythm, no murmurs, no rubs, no clicks Abd:  soft, positive bowel sounds, no organomegally, no rebound, no guarding Ext:  2 plus pulses, no edema, no cyanosis, no clubbing Skin:  No rashes no nodules Neuro:  CN II through XII intact, motor grossly intact  EKG -nsr  Assess/Plan:  Atrial flutter - he is s/p ablation and hass not had any additional evidence of atrial flutter. He will undergo watchful waiting HTN - his bp is well controlled.   Matthew Overlie Seriyah Collison,MD

## 2021-07-22 NOTE — Patient Instructions (Addendum)
Medication Instructions:  Your physician recommends that you continue on your current medications as directed. Please refer to the Current Medication list given to you today.  Labwork: None ordered.  Testing/Procedures: None ordered.  Follow-Up: Your physician wants you to follow-up in: one year with Cristopher Peru, MD  You will receive a reminder letter in the mail two months in advance. If you don't receive a letter, please call our office to schedule the follow-up appointment.  Any Other Special Instructions Will Be Listed Below (If Applicable).  If you need a refill on your cardiac medications before your next appointment, please call your pharmacy.

## 2021-07-22 NOTE — Progress Notes (Signed)
He presents today with a history of peripheral vascular disease as well as neuropathy to his mid leg.  States that the laser therapy did not work.  Objective: Vital signs are stable alert oriented x3.  Pulses are nonpalpable but capillary fill time is immediate neurologic sensorium is diminished per Semmes Weinstein monofilament to the mid leg.  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  Assessment: Pain in limb secondary to peripheral neuropathy peripheral vascular disease and elongated nails.  Plan: Debridement of nails 1 through 5 bilateral.  Discontinue the use of laser therapy.

## 2021-08-06 DIAGNOSIS — M25551 Pain in right hip: Secondary | ICD-10-CM | POA: Diagnosis not present

## 2021-08-11 ENCOUNTER — Other Ambulatory Visit: Payer: Self-pay | Admitting: Internal Medicine

## 2021-08-11 ENCOUNTER — Ambulatory Visit
Admission: RE | Admit: 2021-08-11 | Discharge: 2021-08-11 | Disposition: A | Discharge status: Home / Self Care | Payer: Medicare Other | Source: Ambulatory Visit | Attending: Internal Medicine | Admitting: Internal Medicine

## 2021-08-11 DIAGNOSIS — M25551 Pain in right hip: Secondary | ICD-10-CM

## 2021-08-11 DIAGNOSIS — M25561 Pain in right knee: Secondary | ICD-10-CM

## 2021-08-11 DIAGNOSIS — M545 Low back pain, unspecified: Secondary | ICD-10-CM | POA: Diagnosis not present

## 2021-08-22 DIAGNOSIS — M5451 Vertebrogenic low back pain: Secondary | ICD-10-CM | POA: Diagnosis not present

## 2021-08-22 DIAGNOSIS — M545 Low back pain, unspecified: Secondary | ICD-10-CM | POA: Diagnosis not present

## 2021-08-27 DIAGNOSIS — D1801 Hemangioma of skin and subcutaneous tissue: Secondary | ICD-10-CM | POA: Diagnosis not present

## 2021-08-27 DIAGNOSIS — L570 Actinic keratosis: Secondary | ICD-10-CM | POA: Diagnosis not present

## 2021-08-27 DIAGNOSIS — Z85820 Personal history of malignant melanoma of skin: Secondary | ICD-10-CM | POA: Diagnosis not present

## 2021-08-27 DIAGNOSIS — L821 Other seborrheic keratosis: Secondary | ICD-10-CM | POA: Diagnosis not present

## 2021-08-27 DIAGNOSIS — L814 Other melanin hyperpigmentation: Secondary | ICD-10-CM | POA: Diagnosis not present

## 2021-08-27 DIAGNOSIS — D485 Neoplasm of uncertain behavior of skin: Secondary | ICD-10-CM | POA: Diagnosis not present

## 2021-09-04 DIAGNOSIS — M545 Low back pain, unspecified: Secondary | ICD-10-CM | POA: Diagnosis not present

## 2021-09-04 DIAGNOSIS — M5451 Vertebrogenic low back pain: Secondary | ICD-10-CM | POA: Diagnosis not present

## 2021-09-09 DIAGNOSIS — M48061 Spinal stenosis, lumbar region without neurogenic claudication: Secondary | ICD-10-CM | POA: Diagnosis not present

## 2021-09-09 DIAGNOSIS — M5451 Vertebrogenic low back pain: Secondary | ICD-10-CM | POA: Diagnosis not present

## 2021-10-03 DIAGNOSIS — Z6832 Body mass index (BMI) 32.0-32.9, adult: Secondary | ICD-10-CM | POA: Diagnosis not present

## 2021-10-03 DIAGNOSIS — M5136 Other intervertebral disc degeneration, lumbar region: Secondary | ICD-10-CM | POA: Diagnosis not present

## 2021-10-03 DIAGNOSIS — I10 Essential (primary) hypertension: Secondary | ICD-10-CM | POA: Diagnosis not present

## 2021-10-07 ENCOUNTER — Other Ambulatory Visit: Payer: Self-pay | Admitting: Neurological Surgery

## 2021-10-10 DIAGNOSIS — Z7184 Encounter for health counseling related to travel: Secondary | ICD-10-CM | POA: Diagnosis not present

## 2021-10-10 DIAGNOSIS — I509 Heart failure, unspecified: Secondary | ICD-10-CM | POA: Diagnosis not present

## 2021-10-10 DIAGNOSIS — J069 Acute upper respiratory infection, unspecified: Secondary | ICD-10-CM | POA: Diagnosis not present

## 2021-10-22 DIAGNOSIS — M25551 Pain in right hip: Secondary | ICD-10-CM | POA: Diagnosis not present

## 2021-10-22 DIAGNOSIS — Z961 Presence of intraocular lens: Secondary | ICD-10-CM | POA: Diagnosis not present

## 2021-10-22 DIAGNOSIS — H353131 Nonexudative age-related macular degeneration, bilateral, early dry stage: Secondary | ICD-10-CM | POA: Diagnosis not present

## 2021-10-22 DIAGNOSIS — E1165 Type 2 diabetes mellitus with hyperglycemia: Secondary | ICD-10-CM | POA: Diagnosis not present

## 2021-11-06 DIAGNOSIS — C439 Malignant melanoma of skin, unspecified: Secondary | ICD-10-CM | POA: Diagnosis not present

## 2021-11-06 DIAGNOSIS — Z8546 Personal history of malignant neoplasm of prostate: Secondary | ICD-10-CM | POA: Diagnosis not present

## 2021-11-06 DIAGNOSIS — M48061 Spinal stenosis, lumbar region without neurogenic claudication: Secondary | ICD-10-CM | POA: Diagnosis not present

## 2021-11-06 DIAGNOSIS — G4730 Sleep apnea, unspecified: Secondary | ICD-10-CM | POA: Diagnosis not present

## 2021-11-06 DIAGNOSIS — I4892 Unspecified atrial flutter: Secondary | ICD-10-CM | POA: Diagnosis not present

## 2021-11-06 DIAGNOSIS — E1140 Type 2 diabetes mellitus with diabetic neuropathy, unspecified: Secondary | ICD-10-CM | POA: Diagnosis not present

## 2021-11-06 DIAGNOSIS — Z7184 Encounter for health counseling related to travel: Secondary | ICD-10-CM | POA: Diagnosis not present

## 2021-11-06 DIAGNOSIS — D696 Thrombocytopenia, unspecified: Secondary | ICD-10-CM | POA: Diagnosis not present

## 2021-11-06 DIAGNOSIS — I872 Venous insufficiency (chronic) (peripheral): Secondary | ICD-10-CM | POA: Diagnosis not present

## 2021-11-06 DIAGNOSIS — I5030 Unspecified diastolic (congestive) heart failure: Secondary | ICD-10-CM | POA: Diagnosis not present

## 2021-11-06 DIAGNOSIS — E039 Hypothyroidism, unspecified: Secondary | ICD-10-CM | POA: Diagnosis not present

## 2021-11-12 ENCOUNTER — Ambulatory Visit
Admission: RE | Admit: 2021-11-12 | Discharge: 2021-11-12 | Disposition: A | Discharge status: Home / Self Care | Payer: Medicare Other | Source: Ambulatory Visit | Attending: Internal Medicine | Admitting: Internal Medicine

## 2021-11-12 ENCOUNTER — Other Ambulatory Visit: Payer: Self-pay | Admitting: Internal Medicine

## 2021-11-12 DIAGNOSIS — R051 Acute cough: Secondary | ICD-10-CM | POA: Diagnosis not present

## 2021-11-12 DIAGNOSIS — R059 Cough, unspecified: Secondary | ICD-10-CM | POA: Diagnosis not present

## 2021-12-09 DIAGNOSIS — C61 Malignant neoplasm of prostate: Secondary | ICD-10-CM | POA: Diagnosis not present

## 2021-12-09 NOTE — Pre-Procedure Instructions (Signed)
Surgical Instructions    Your procedure is scheduled on Tuesday 12/16/21.   Report to [facility] Main Entrance "A" at 05:30 A.M., then check in with the Admitting office.  Call this number if you have problems the morning of surgery:  [phone]   If you have any questions prior to your surgery date call [phone]: Open Monday-Friday 8am-4pm    Remember:  Do not eat after midnight the night before your surgery  You may drink clear liquids until 04:30 A.M. the morning of your surgery.   Clear liquids allowed are: Water, Non-Citrus Juices (without pulp), Carbonated Beverages, Clear Tea, Black Coffee ONLY (NO MILK, CREAM OR POWDERED CREAMER of any kind), and Gatorade    Take these medicines the morning of surgery with A SIP OF WATER:   levothyroxine (SYNTHROID, LEVOTHROID)  metoprolol succinate (TOPROL-XL)  amoxicillin (AMOXIL)    Take these medicines if needed:   famotidine (PEPCID)   As of today, STOP taking any Aspirin (unless otherwise instructed by your surgeon) Aleve, Naproxen, Ibuprofen, Motrin, Advil, Goody's, BC's, all herbal medications, fish oil, and all vitamins.  WHAT DO I DO ABOUT MY DIABETES MEDICATION?   Do not take oral diabetes medicines (pills) the morning of surgery.  DO NOT TAKE metFORMIN (GLUCOPHAGE) the morning of surgery.   The day of surgery, do not take other diabetes injectables, including Byetta (exenatide), Bydureon (exenatide ER), Victoza (liraglutide), or Trulicity (dulaglutide).   HOW TO MANAGE YOUR DIABETES BEFORE AND AFTER SURGERY  Why is it important to control my blood sugar before and after surgery? Improving blood sugar levels before and after surgery helps healing and can limit problems. A way of improving blood sugar control is eating a healthy diet by:  Eating less sugar and carbohydrates  Increasing activity/exercise  Talking with your doctor about reaching your blood sugar goals High blood sugars (greater than 180 mg/dL) can  raise your risk of infections and slow your recovery, so you will need to focus on controlling your diabetes during the weeks before surgery. Make sure that the doctor who takes care of your diabetes knows about your planned surgery including the date and location.  How do I manage my blood sugar before surgery? Check your blood sugar at least 4 times a day, starting 2 days before surgery, to make sure that the level is not too high or low.  Check your blood sugar the morning of your surgery when you wake up and every 2 hours until you get to the Short Stay unit.  If your blood sugar is less than 70 mg/dL, you will need to treat for low blood sugar: Do not take insulin. Treat a low blood sugar (less than 70 mg/dL) with  cup of clear juice (cranberry or apple), 4 glucose tablets, OR glucose gel. Recheck blood sugar in 15 minutes after treatment (to make sure it is greater than 70 mg/dL). If your blood sugar is not greater than 70 mg/dL on recheck, call [phone] for further instructions. Report your blood sugar to the short stay nurse when you get to Short Stay.  If you are admitted to the hospital after surgery: Your blood sugar will be checked by the staff and you will probably be given insulin after surgery (instead of oral diabetes medicines) to make sure you have good blood sugar levels. The goal for blood sugar control after surgery is 80-180 mg/dL.            Do not wear jewelry or makeup Do  not wear lotions, powders, perfumes/colognes, or deodorant. Do not shave 48 hours prior to surgery.  Men may shave face and neck. Do not bring valuables to the hospital. Do not wear nail polish, gel polish, artificial nails, or any other type of covering on natural nails (fingers and toes) If you have artificial nails or gel coating that need to be removed by a nail salon, please have this removed prior to surgery. Artificial nails or gel coating may interfere with anesthesia's ability to  adequately monitor your vital signs.  [facility] is not responsible for any belongings or valuables. .   Do NOT Smoke (Tobacco/Vaping)  24 hours prior to your procedure  If you use a CPAP at night, you may bring your mask for your overnight stay.   Contacts, glasses, hearing aids, dentures or partials may not be worn into surgery, please bring cases for these belongings   For patients admitted to the hospital, discharge time will be determined by your treatment team.   Patients discharged the day of surgery will not be allowed to drive home, and someone needs to stay with them for 24 hours.   SURGICAL WAITING ROOM VISITATION Patients having surgery or a procedure in a hospital may have two support people. Children under the age of 2 must have an adult with them who is not the patient. They may stay in the waiting area during the procedure and may switch out with other visitors. If the patient needs to stay at the hospital during part of their recovery, the visitor guidelines for inpatient rooms apply.  Please refer to the [facility] website for the visitor guidelines for Inpatients (after your surgery is over and you are in a regular room).       Special instructions:    Oral Hygiene is also important to reduce your risk of infection.  Remember - BRUSH YOUR TEETH THE MORNING OF SURGERY WITH YOUR REGULAR TOOTHPASTE   [place]- Preparing For Surgery  Before surgery, you can play an important role. Because skin is not sterile, your skin needs to be as free of germs as possible. You can reduce the number of germs on your skin by washing with CHG (chlorahexidine gluconate) Soap before surgery.  CHG is an antiseptic cleaner which kills germs and bonds with the skin to continue killing germs even after washing.     Please do not use if you have an allergy to CHG or antibacterial soaps. If your skin becomes reddened/irritated stop using the CHG.  Do not shave (including legs and  underarms) for at least 48 hours prior to first CHG shower. It is OK to shave your face.  Please follow these instructions carefully.     Shower the NIGHT BEFORE SURGERY and the MORNING OF SURGERY with CHG Soap.   If you chose to wash your hair, wash your hair first as usual with your normal shampoo. After you shampoo, rinse your hair and body thoroughly to remove the shampoo.  Then [employer] and genitals (private parts) with your normal soap and rinse thoroughly to remove soap.  After that Use CHG Soap as you would any other liquid soap. You can apply CHG directly to the skin and wash gently with a scrungie or a clean washcloth.   Apply the CHG Soap to your body ONLY FROM THE NECK DOWN.  Do not use on open wounds or open sores. Avoid contact with your eyes, ears, mouth and genitals (private parts). Wash Face and genitals (  private parts)  with your normal soap.   Wash thoroughly, paying special attention to the area where your surgery will be performed.  Thoroughly rinse your body with warm water from the neck down.  DO NOT shower/wash with your normal soap after using and rinsing off the CHG Soap.  Pat yourself dry with a CLEAN TOWEL.  Wear CLEAN PAJAMAS to bed the night before surgery  Place CLEAN SHEETS on your bed the night before your surgery  DO NOT SLEEP WITH PETS.   Day of Surgery:  Take a shower with CHG soap. Wear Clean/Comfortable clothing the morning of surgery Do not apply any deodorants/lotions.   Remember to brush your teeth WITH YOUR REGULAR TOOTHPASTE.    If you received a COVID test during your pre-op visit, it is requested that you wear a mask when out in public, stay away from anyone that may not be feeling well, and notify your surgeon if you develop symptoms. If you have been in contact with anyone that has tested positive in the last 10 days, please notify your surgeon.    Please read over the following fact sheets that you were given.

## 2021-12-10 ENCOUNTER — Encounter (HOSPITAL_COMMUNITY)
Admission: RE | Admit: 2021-12-10 | Discharge: 2021-12-10 | Disposition: A | Discharge status: Home / Self Care | Payer: Medicare Other | Source: Ambulatory Visit | Attending: Neurological Surgery | Admitting: Neurological Surgery

## 2021-12-10 ENCOUNTER — Other Ambulatory Visit: Payer: Self-pay

## 2021-12-10 ENCOUNTER — Encounter (HOSPITAL_COMMUNITY): Payer: Self-pay

## 2021-12-10 VITALS — BP 136/74 | HR 99 | Temp 98.4°F | Resp 18 | Ht 78.0 in | Wt 283.0 lb

## 2021-12-10 DIAGNOSIS — H9192 Unspecified hearing loss, left ear: Secondary | ICD-10-CM | POA: Diagnosis not present

## 2021-12-10 DIAGNOSIS — Z01812 Encounter for preprocedural laboratory examination: Secondary | ICD-10-CM | POA: Insufficient documentation

## 2021-12-10 DIAGNOSIS — M5136 Other intervertebral disc degeneration, lumbar region: Secondary | ICD-10-CM | POA: Insufficient documentation

## 2021-12-10 DIAGNOSIS — E11621 Type 2 diabetes mellitus with foot ulcer: Secondary | ICD-10-CM

## 2021-12-10 DIAGNOSIS — I4892 Unspecified atrial flutter: Secondary | ICD-10-CM | POA: Insufficient documentation

## 2021-12-10 DIAGNOSIS — I110 Hypertensive heart disease with heart failure: Secondary | ICD-10-CM | POA: Insufficient documentation

## 2021-12-10 DIAGNOSIS — I509 Heart failure, unspecified: Secondary | ICD-10-CM | POA: Insufficient documentation

## 2021-12-10 DIAGNOSIS — Z01818 Encounter for other preprocedural examination: Secondary | ICD-10-CM

## 2021-12-10 HISTORY — DX: Hypothyroidism, unspecified: E03.9

## 2021-12-10 LAB — BASIC METABOLIC PANEL
Anion gap: 5 (ref 5–15)
BUN: 14 mg/dL (ref 8–23)
CO2: 26 mmol/L (ref 22–32)
Calcium: 9.5 mg/dL (ref 8.9–10.3)
Chloride: 106 mmol/L (ref 98–111)
Creatinine, Ser: 0.82 mg/dL (ref 0.61–1.24)
GFR, Estimated: >60 mL/min (ref >60)
Glucose, Bld: 169 mg/dL — ABNORMAL HIGH (ref 70–99)
Potassium: 4.3 mmol/L (ref 3.5–5.1)
Sodium: 137 mmol/L (ref 135–145)

## 2021-12-10 LAB — CBC
HCT: 40.7 % (ref 39.0–52.0)
Hemoglobin: 13.2 g/dL (ref 13.0–17.0)
MCH: 31.8 pg (ref 26.0–34.0)
MCHC: 32.4 g/dL (ref 30.0–36.0)
MCV: 98.1 fL (ref 80.0–100.0)
Platelets: 173 10*3/uL (ref 150–400)
RBC: 4.15 MIL/uL — ABNORMAL LOW (ref 4.22–5.81)
RDW: 14.2 % (ref 11.5–15.5)
WBC: 4.1 10*3/uL (ref 4.0–10.5)
nRBC: 0 % (ref 0.0–0.2)

## 2021-12-10 LAB — TYPE AND SCREEN
ABO/RH(D): O POS
Antibody Screen: NEGATIVE

## 2021-12-10 LAB — SURGICAL PCR SCREEN
MRSA, PCR: NEGATIVE
Staphylococcus aureus: NEGATIVE

## 2021-12-10 LAB — GLUCOSE, CAPILLARY: Glucose-Capillary: 208 mg/dL — ABNORMAL HIGH (ref 70–99)

## 2021-12-10 LAB — HEMOGLOBIN A1C
Hgb A1c MFr Bld: 6.5 % — ABNORMAL HIGH (ref 4.8–5.6)
Mean Plasma Glucose: 139.85 mg/dL

## 2021-12-10 NOTE — Progress Notes (Signed)
PCP - Wenda Low Cardiologist - Cristopher Peru Urologist: Alexis Frock Oncologist: Tyler Pita  PPM/ICD - denies   Chest x-ray - n/a EKG - 07/22/21 Stress Test - 02/07/18 ECHO - 06/11/20 Cardiac Cath - 05/26/18  Sleep Study - +OSA, wears cpap nightly/settings at 16   Fasting Blood Sugar - does not check CBG at home   Follow your surgeon's instructions on when to stop Aspirin.  If no instructions were given by your surgeon then you will need to call the office to get those instructions.     ERAS Protcol -no   COVID TEST- not needed   Anesthesia review: yes, history of ablation and last visit with gregg taylor was 07/2021.  Patient denies shortness of breath, fever, cough and chest pain at PAT appointment   All instructions explained to the patient, with a verbal understanding of the material. Patient agrees to go over the instructions while at home for a better understanding. Patient also instructed to self quarantine after being tested for COVID-19. The opportunity to ask questions was provided.

## 2021-12-11 NOTE — Progress Notes (Signed)
Anesthesia Chart Review:   Case: [id number] Date/Time: 12/16/21 0715   Procedure: L2-3 PLIF w/Revision of L3 to L5 fusion (Back) - 3C/RM 21   Anesthesia type: General   Pre-op diagnosis: Lumbar degenerative disc disease   Location: [facility] OR ROOM 55 / [place] OR   Surgeons: Kristeen Miss, MD       DISCUSSION: Pt is 76 years old with hx atrial flutter (s/p ablation 2021; no recurrence), CHF (EF normal on 2021 echo), HTN, DM  Pt is deaf in left ear  VS: BP 136/74   Pulse 99   Temp 36.9 C (Oral)   Resp 18   Ht '6\' 6"'$  (1.981 m)   Wt 128.4 kg   SpO2 99%   BMI 32.70 kg/m   PROVIDERS: - PCP is Wenda Low, MD - EP cardiologist is Cristopher Peru, MD  LABS: Labs reviewed: Acceptable for surgery. (all labs ordered are listed, but only abnormal results are displayed)  Labs Reviewed  GLUCOSE, CAPILLARY - Abnormal; Notable for the following components:      Result Value   Glucose-Capillary 208 (*)    All other components within normal limits  CBC - Abnormal; Notable for the following components:   RBC 4.15 (*)    All other components within normal limits  BASIC METABOLIC PANEL - Abnormal; Notable for the following components:   Glucose, Bld 169 (*)    All other components within normal limits  HEMOGLOBIN A1C - Abnormal; Notable for the following components:   Hgb A1c MFr Bld 6.5 (*)    All other components within normal limits  SURGICAL PCR SCREEN  TYPE AND SCREEN     IMAGES: CXR 11/13/21:  - No active cardiopulmonary disease.  EKG 07/22/21: NSR   CV: Echo 06/11/20:  1. Since the last study on 12/14/2019 LVEF has normalized from 30-35% to 60-65%.  2. Left ventricular ejection fraction, by estimation, is 60 to 65%. The left ventricle has normal function. The left ventricle has no regional wall motion abnormalities. There is mild concentric left ventricular hypertrophy. Left ventricular diastolic parameters are consistent with Grade I diastolic dysfunction (impaired relaxation).  3.  Right ventricular systolic function is normal. The right ventricular size is mildly enlarged. There is normal pulmonary artery systolic pressure. The estimated right ventricular systolic pressure is 45.0 mmHg.  4. Left atrial size was mildly dilated.  5. Right atrial size was moderately dilated.  6. The mitral valve is normal in structure. No evidence of mitral valve regurgitation. No evidence of mitral stenosis.  7. The aortic valve is normal in structure. Aortic valve regurgitation is mild. Mild to moderate aortic valve sclerosis/calcification is present, without any evidence of aortic stenosis.  8. The inferior vena cava is normal in size with greater than 50% respiratory variability, suggesting right atrial pressure of 3 mmHg.    R heart cath 05/26/18:  1. Normal filling pressures.   2. Normal pulmonary artery pressure 3. High cardiac output.  4. No evidence for shunt lesion on shunt run.   CT coronary morphology 05/02/18:  1. Moderate RV enlargement with areas of mid and basal hypokinesis but overall EF preserved 51% with no RVH 2.   Normal LV size and function EF 72% 3.  No obvious ASD/PFO/VSD PV;s not adequately seen - Patient should have f/u right heart cath with shunt run and consider cardiac CT to further r/o anomalous pulmonary veins or other central vascular shunts - Can consider also returning for RV dysplasia sequences and Qp/Qs byMRI  Nuclear stress test 02/08/20:  Nuclear stress EF: 52%. There was no ST segment deviation noted during stress. The study is normal. This is a low risk study. The left ventricular ejection fraction is mildly decreased (45-54%).   Past Medical History:  Diagnosis Date   Chronic right-sided CHF (congestive heart failure) ([place])    Echo 11/17: EF 55 and 60, normal wall motion, trivial AI, moderate RVE, mildly reduced RVSF, moderate to severe RAE // Echo 6/18: EF 60-65, Gr 1 DD, trivial AI, mild RVE, mild RAE.   Deafness in left ear    100% deaf  L ear; auditory nerve failure; + balance issues   Diabetic peripheral neuropathy associated with type 2 diabetes mellitus ([place]) 08/29/2020   Glucose intolerance (impaired glucose tolerance)    Hyperlipemia    Hypothyroidism    Melanoma of skin (HCC)    melanoma left shoulder. MOHS x 3.    Mild aortic insufficiency    Mitral valve prolapse    hx of - seen years ago by Dr Enid Derry in [facility], [place] - not seen on recent echoes   Prostate cancer [facility])    Sleep apnea    cpap 16    Past Surgical History:  Procedure Laterality Date   A-FLUTTER ABLATION N/A 12/14/2019   Procedure: A-FLUTTER ABLATION;  Surgeon: Evans Lance, MD;  Location: [place] [facility];  Service: Cardiovascular;  Laterality: N/A;   CATARACT EXTRACTION Left    COLONOSCOPY     polyps in past   COLONOSCOPY WITH PROPOFOL N/A 08/14/2014   Procedure: COLONOSCOPY WITH PROPOFOL;  Surgeon: Garlan Fair, MD;  [facility];  Service: Endoscopy;  Laterality: N/A;   MELANOMA EXCISION     MENISCUS REPAIR Left 1970   RETINAL DETACHMENT SURGERY     RIGHT HEART CATH N/A 05/26/2018   Procedure: RIGHT HEART CATH;  Surgeon: Larey Dresser, MD;  Location: [place] [facility];  Service: Cardiovascular;  Laterality: N/A;   SPINE SURGERY     4'14 lumbar 3,4,5 fusion([place])   TONSILLECTOMY      MEDICATIONS:  amoxicillin (AMOXIL) 500 MG capsule   Cholecalciferol (VITAMIN D) 2000 UNITS tablet   diphenoxylate-atropine (LOMOTIL) 2.5-0.025 MG tablet   famotidine (PEPCID) 20 MG tablet   furosemide (LASIX) 40 MG tablet   ibuprofen (ADVIL) 200 MG tablet   Leuprolide Acetate (ELIGARD [place])   levothyroxine (SYNTHROID, LEVOTHROID) 50 MCG tablet   levothyroxine (SYNTHROID, LEVOTHROID) 75 MCG tablet   metFORMIN (GLUCOPHAGE) 500 MG tablet   metoprolol succinate (TOPROL-XL) 25 MG 24 hr tablet   Multiple Vitamins-Minerals (MULTIVITAMIN WITH MINERALS) tablet   Multiple Vitamins-Minerals (PRESERVISION AREDS 2 PO)    mupirocin ointment (BACTROBAN) 2 %   predniSONE (DELTASONE) 10 MG tablet   simvastatin (ZOCOR) 20 MG tablet   TURMERIC PO   No current facility-administered medications for this encounter.    If no changes, I anticipate pt can proceed with surgery as scheduled.   Willeen Cass, PhD, FNP-BC [facility] Short Stay Surgical Center/Anesthesiology Phone: [phone] 12/11/2021 11:36 AM

## 2021-12-11 NOTE — Anesthesia Preprocedure Evaluation (Addendum)
Anesthesia Evaluation  Patient identified by MRN, date of birth, ID band Patient awake    Reviewed: Allergy & Precautions, NPO status , Patient's Chart, lab work & pertinent test results, reviewed documented beta blocker date and time   Airway Mallampati: II  TM Distance: >3 FB Neck ROM: Full    Dental  (+) Dental Advisory Given   Pulmonary sleep apnea ,    breath sounds clear to auscultation       Cardiovascular hypertension, Pt. on home beta blockers and Pt. on medications +CHF   Rhythm:Regular Rate:Normal     Neuro/Psych  Neuromuscular disease    GI/Hepatic Neg liver ROS, GERD  ,  Endo/Other  diabetes, Type 2, Oral Hypoglycemic AgentsHypothyroidism   Renal/GU negative Renal ROS     Musculoskeletal  (+) Arthritis ,   Abdominal   Peds  Hematology negative hematology ROS (+)   Anesthesia Other Findings   Reproductive/Obstetrics                            Lab Results  Component Value Date   WBC 4.1 12/10/2021   HGB 13.2 12/10/2021   HCT 40.7 12/10/2021   MCV 98.1 12/10/2021   PLT 173 12/10/2021   Lab Results  Component Value Date   CREATININE 0.82 12/10/2021   BUN 14 12/10/2021   NA 137 12/10/2021   K 4.3 12/10/2021   CL 106 12/10/2021   CO2 26 12/10/2021    Anesthesia Physical Anesthesia Plan  ASA: 2  Anesthesia Plan: General   Post-op Pain Management: Tylenol PO (pre-op)* and Toradol IV (intra-op)*   Induction: Intravenous  PONV Risk Score and Plan: 2 and Dexamethasone, Ondansetron and Treatment may vary due to age or medical condition  Airway Management Planned: Oral ETT  Additional Equipment:   Intra-op Plan:   Post-operative Plan: Extubation in OR  Informed Consent: I have reviewed the patients History and Physical, chart, labs and discussed the procedure including the risks, benefits and alternatives for the proposed anesthesia with the patient or authorized  representative who has indicated his/her understanding and acceptance.     Dental advisory given  Plan Discussed with: CRNA  Anesthesia Plan Comments:        Anesthesia Quick Evaluation

## 2021-12-11 NOTE — Progress Notes (Signed)
Patient called to ask if he can continue taking his multivitamin prior to surgery. Patient instructed that per pre-op instructions on 5/32/23, he is to stop taking vitamins prior to surgery. Patient states that he takes a multivitamin for his eyes that he would like to keep taking. Patient to call Dr. Clarice Pole office to confirm if he can continue taking this.

## 2021-12-15 DIAGNOSIS — Z5111 Encounter for antineoplastic chemotherapy: Secondary | ICD-10-CM | POA: Diagnosis not present

## 2021-12-15 DIAGNOSIS — C61 Malignant neoplasm of prostate: Secondary | ICD-10-CM | POA: Diagnosis not present

## 2021-12-16 ENCOUNTER — Inpatient Hospital Stay (HOSPITAL_COMMUNITY)
Admission: RE | Admit: 2021-12-16 | Discharge: 2021-12-17 | DRG: 454 | Disposition: A | Discharge status: Home Health | Payer: Medicare Other | Attending: Neurological Surgery | Admitting: Neurological Surgery

## 2021-12-16 ENCOUNTER — Inpatient Hospital Stay (HOSPITAL_COMMUNITY): Payer: Medicare Other | Admitting: Certified Registered"

## 2021-12-16 ENCOUNTER — Inpatient Hospital Stay (HOSPITAL_COMMUNITY): Payer: Medicare Other | Admitting: Emergency Medicine

## 2021-12-16 ENCOUNTER — Inpatient Hospital Stay (HOSPITAL_COMMUNITY): Payer: Medicare Other

## 2021-12-16 ENCOUNTER — Other Ambulatory Visit: Payer: Self-pay

## 2021-12-16 ENCOUNTER — Encounter (HOSPITAL_COMMUNITY): Payer: Self-pay | Admitting: Neurological Surgery

## 2021-12-16 ENCOUNTER — Encounter (HOSPITAL_COMMUNITY)
Admission: RE | Disposition: A | Discharge status: Home Health | Payer: Self-pay | Source: Home / Self Care | Attending: Neurological Surgery

## 2021-12-16 DIAGNOSIS — Z7984 Long term (current) use of oral hypoglycemic drugs: Secondary | ICD-10-CM

## 2021-12-16 DIAGNOSIS — G992 Myelopathy in diseases classified elsewhere: Secondary | ICD-10-CM | POA: Diagnosis present

## 2021-12-16 DIAGNOSIS — Z79899 Other long term (current) drug therapy: Secondary | ICD-10-CM | POA: Diagnosis not present

## 2021-12-16 DIAGNOSIS — E11621 Type 2 diabetes mellitus with foot ulcer: Secondary | ICD-10-CM

## 2021-12-16 DIAGNOSIS — E1142 Type 2 diabetes mellitus with diabetic polyneuropathy: Secondary | ICD-10-CM | POA: Diagnosis present

## 2021-12-16 DIAGNOSIS — Z79890 Hormone replacement therapy (postmenopausal): Secondary | ICD-10-CM | POA: Diagnosis not present

## 2021-12-16 DIAGNOSIS — M48062 Spinal stenosis, lumbar region with neurogenic claudication: Secondary | ICD-10-CM | POA: Diagnosis present

## 2021-12-16 DIAGNOSIS — M4326 Fusion of spine, lumbar region: Secondary | ICD-10-CM | POA: Diagnosis not present

## 2021-12-16 DIAGNOSIS — M48061 Spinal stenosis, lumbar region without neurogenic claudication: Secondary | ICD-10-CM | POA: Diagnosis not present

## 2021-12-16 DIAGNOSIS — M5416 Radiculopathy, lumbar region: Secondary | ICD-10-CM | POA: Diagnosis present

## 2021-12-16 DIAGNOSIS — Z981 Arthrodesis status: Secondary | ICD-10-CM | POA: Diagnosis not present

## 2021-12-16 DIAGNOSIS — M4316 Spondylolisthesis, lumbar region: Secondary | ICD-10-CM | POA: Diagnosis not present

## 2021-12-16 DIAGNOSIS — M5136 Other intervertebral disc degeneration, lumbar region: Secondary | ICD-10-CM | POA: Diagnosis not present

## 2021-12-16 LAB — GLUCOSE, CAPILLARY
Glucose-Capillary: 167 mg/dL — ABNORMAL HIGH (ref 70–99)
Glucose-Capillary: 144 mg/dL — ABNORMAL HIGH (ref 70–99)
Glucose-Capillary: 176 mg/dL — ABNORMAL HIGH (ref 70–99)
Glucose-Capillary: 145 mg/dL — ABNORMAL HIGH (ref 70–99)
Glucose-Capillary: 233 mg/dL — ABNORMAL HIGH (ref 70–99)
Glucose-Capillary: 160 mg/dL — ABNORMAL HIGH (ref 70–99)

## 2021-12-16 SURGERY — POSTERIOR LUMBAR FUSION 1 LEVEL
Anesthesia: General | Site: Back

## 2021-12-16 MED ORDER — PHENOL 1.4 % MT LIQD: 1.0000 | OROMUCOSAL | Status: DC | PRN

## 2021-12-16 MED ORDER — CHLORHEXIDINE GLUCONATE CLOTH 2 % EX PADS: 6.0000 | MEDICATED_PAD | Freq: Once | CUTANEOUS | Status: DC

## 2021-12-16 MED ORDER — SENNA 8.6 MG PO TABS
1.0000 | ORAL_TABLET | Freq: Two times a day (BID) | ORAL | Status: DC
  Administered 2021-12-17: 8.6 mg via ORAL
  Filled 2021-12-16: qty 1

## 2021-12-16 MED ORDER — METHOCARBAMOL 1000 MG/10ML IJ SOLN: 500.0000 mg | Freq: Four times a day (QID) | INTRAVENOUS | Status: DC | PRN

## 2021-12-16 MED ORDER — LIDOCAINE 2% (20 MG/ML) 5 ML SYRINGE
INTRAMUSCULAR | Status: AC
  Filled 2021-12-16: qty 5

## 2021-12-16 MED ORDER — FENTANYL CITRATE (PF) 250 MCG/5ML IJ SOLN
INTRAMUSCULAR | Status: DC | PRN
  Administered 2021-12-16 (×4): 50 ug via INTRAVENOUS

## 2021-12-16 MED ORDER — ACETAMINOPHEN 500 MG PO TABS
1000.0000 mg | ORAL_TABLET | Freq: Once | ORAL | Status: AC
Start: 2021-12-16 — End: 2021-12-16
  Administered 2021-12-16: 1000 mg via ORAL
  Filled 2021-12-16: qty 2

## 2021-12-16 MED ORDER — LIDOCAINE-EPINEPHRINE 1 %-1:100000 IJ SOLN
INTRAMUSCULAR | Status: DC | PRN
  Administered 2021-12-16: 5 mL

## 2021-12-16 MED ORDER — ACETAMINOPHEN 650 MG RE SUPP: 650.0000 mg | RECTAL | Status: DC | PRN

## 2021-12-16 MED ORDER — PROPOFOL 10 MG/ML IV BOLUS
INTRAVENOUS | Status: AC
  Filled 2021-12-16: qty 20

## 2021-12-16 MED ORDER — ONDANSETRON HCL 4 MG PO TABS: 4.0000 mg | ORAL_TABLET | Freq: Four times a day (QID) | ORAL | Status: DC | PRN

## 2021-12-16 MED ORDER — PHENYLEPHRINE 80 MCG/ML (10ML) SYRINGE FOR IV PUSH (FOR BLOOD PRESSURE SUPPORT)
PREFILLED_SYRINGE | INTRAVENOUS | Status: DC | PRN
  Administered 2021-12-16: 80 ug via INTRAVENOUS
  Administered 2021-12-16: 160 ug via INTRAVENOUS

## 2021-12-16 MED ORDER — METHOCARBAMOL 500 MG PO TABS
500.0000 mg | ORAL_TABLET | Freq: Four times a day (QID) | ORAL | Status: DC | PRN
  Administered 2021-12-17 (×2): 500 mg via ORAL
  Filled 2021-12-16 (×3): qty 1

## 2021-12-16 MED ORDER — FUROSEMIDE 40 MG PO TABS
40.0000 mg | ORAL_TABLET | Freq: Every day | ORAL | Status: DC
  Filled 2021-12-16: qty 1

## 2021-12-16 MED ORDER — FENTANYL CITRATE (PF) 100 MCG/2ML IJ SOLN
INTRAMUSCULAR | Status: AC
  Filled 2021-12-16: qty 2

## 2021-12-16 MED ORDER — FAMOTIDINE 20 MG PO TABS: 20.0000 mg | ORAL_TABLET | Freq: Every day | ORAL | Status: DC | PRN

## 2021-12-16 MED ORDER — SODIUM CHLORIDE 0.9% FLUSH
3.0000 mL | Freq: Two times a day (BID) | INTRAVENOUS | Status: DC
  Administered 2021-12-16: 3 mL via INTRAVENOUS

## 2021-12-16 MED ORDER — ONDANSETRON HCL 4 MG/2ML IJ SOLN
INTRAMUSCULAR | Status: DC | PRN
  Administered 2021-12-16: 4 mg via INTRAVENOUS

## 2021-12-16 MED ORDER — LIDOCAINE-EPINEPHRINE 1 %-1:100000 IJ SOLN
INTRAMUSCULAR | Status: AC
  Filled 2021-12-16: qty 1

## 2021-12-16 MED ORDER — PHENYLEPHRINE HCL-NACL 20-0.9 MG/250ML-% IV SOLN
INTRAVENOUS | Status: DC | PRN
  Administered 2021-12-16: 40 ug/min via INTRAVENOUS

## 2021-12-16 MED ORDER — FENTANYL CITRATE (PF) 250 MCG/5ML IJ SOLN
INTRAMUSCULAR | Status: AC
  Filled 2021-12-16: qty 5

## 2021-12-16 MED ORDER — DEXAMETHASONE SODIUM PHOSPHATE 10 MG/ML IJ SOLN
INTRAMUSCULAR | Status: DC | PRN
  Administered 2021-12-16: 5 mg via INTRAVENOUS

## 2021-12-16 MED ORDER — DOCUSATE SODIUM 100 MG PO CAPS
100.0000 mg | ORAL_CAPSULE | Freq: Two times a day (BID) | ORAL | Status: DC
  Administered 2021-12-17: 100 mg via ORAL
  Filled 2021-12-16: qty 1

## 2021-12-16 MED ORDER — FENTANYL CITRATE (PF) 100 MCG/2ML IJ SOLN
25.0000 ug | INTRAMUSCULAR | Status: DC | PRN
  Administered 2021-12-16: 25 ug via INTRAVENOUS

## 2021-12-16 MED ORDER — BISACODYL 10 MG RE SUPP: 10.0000 mg | Freq: Every day | RECTAL | Status: DC | PRN

## 2021-12-16 MED ORDER — THROMBIN 5000 UNITS EX SOLR
CUTANEOUS | Status: AC
  Filled 2021-12-16: qty 5000

## 2021-12-16 MED ORDER — INSULIN ASPART 100 UNIT/ML IJ SOLN: 0.0000 [IU] | INTRAMUSCULAR | Status: DC | PRN

## 2021-12-16 MED ORDER — SIMVASTATIN 20 MG PO TABS: 20.0000 mg | ORAL_TABLET | ORAL | Status: DC

## 2021-12-16 MED ORDER — PROPOFOL 10 MG/ML IV BOLUS
INTRAVENOUS | Status: DC | PRN
  Administered 2021-12-16: 150 mg via INTRAVENOUS

## 2021-12-16 MED ORDER — LIDOCAINE 2% (20 MG/ML) 5 ML SYRINGE
INTRAMUSCULAR | Status: DC | PRN
  Administered 2021-12-16: 80 mg via INTRAVENOUS

## 2021-12-16 MED ORDER — THROMBIN 5000 UNITS EX SOLR
CUTANEOUS | Status: AC
  Filled 2021-12-16: qty 15000

## 2021-12-16 MED ORDER — LACTATED RINGERS IV SOLN: INTRAVENOUS | Status: DC

## 2021-12-16 MED ORDER — CHLORHEXIDINE GLUCONATE 0.12 % MT SOLN
15.0000 mL | Freq: Once | OROMUCOSAL | Status: AC
  Administered 2021-12-16: 15 mL via OROMUCOSAL
  Filled 2021-12-16: qty 15

## 2021-12-16 MED ORDER — MORPHINE SULFATE (PF) 4 MG/ML IV SOLN: 4.0000 mg | INTRAVENOUS | Status: DC | PRN

## 2021-12-16 MED ORDER — CEFAZOLIN SODIUM-DEXTROSE 2-4 GM/100ML-% IV SOLN
2.0000 g | Freq: Three times a day (TID) | INTRAVENOUS | Status: AC
  Administered 2021-12-16 – 2021-12-17 (×2): 2 g via INTRAVENOUS
  Filled 2021-12-16 (×2): qty 100

## 2021-12-16 MED ORDER — ORAL CARE MOUTH RINSE: 15.0000 mL | Freq: Once | OROMUCOSAL | Status: AC

## 2021-12-16 MED ORDER — SODIUM CHLORIDE 0.9 % IV SOLN: 250.0000 mL | INTRAVENOUS | Status: DC

## 2021-12-16 MED ORDER — METOPROLOL SUCCINATE ER 25 MG PO TB24: 25.0000 mg | ORAL_TABLET | Freq: Every day | ORAL | Status: DC

## 2021-12-16 MED ORDER — ACETAMINOPHEN 325 MG PO TABS
650.0000 mg | ORAL_TABLET | ORAL | Status: DC | PRN
  Filled 2021-12-16: qty 2

## 2021-12-16 MED ORDER — LEVOTHYROXINE SODIUM 25 MCG PO TABS: 50.0000 ug | ORAL_TABLET | ORAL | Status: DC

## 2021-12-16 MED ORDER — FLEET ENEMA 7-19 GM/118ML RE ENEM: 1.0000 | ENEMA | Freq: Once | RECTAL | Status: DC | PRN

## 2021-12-16 MED ORDER — ROCURONIUM BROMIDE 10 MG/ML (PF) SYRINGE
PREFILLED_SYRINGE | INTRAVENOUS | Status: AC
  Filled 2021-12-16: qty 10

## 2021-12-16 MED ORDER — THROMBIN 20000 UNITS EX SOLR
CUTANEOUS | Status: DC | PRN
  Administered 2021-12-16: 20 mL via TOPICAL

## 2021-12-16 MED ORDER — ONDANSETRON HCL 4 MG/2ML IJ SOLN
INTRAMUSCULAR | Status: AC
  Filled 2021-12-16: qty 2

## 2021-12-16 MED ORDER — DEXAMETHASONE SODIUM PHOSPHATE 10 MG/ML IJ SOLN
INTRAMUSCULAR | Status: AC
  Filled 2021-12-16: qty 1

## 2021-12-16 MED ORDER — ROCURONIUM BROMIDE 10 MG/ML (PF) SYRINGE
PREFILLED_SYRINGE | INTRAVENOUS | Status: DC | PRN
  Administered 2021-12-16: 30 mg via INTRAVENOUS
  Administered 2021-12-16 (×2): 20 mg via INTRAVENOUS
  Administered 2021-12-16: 70 mg via INTRAVENOUS

## 2021-12-16 MED ORDER — POLYETHYLENE GLYCOL 3350 17 G PO PACK: 17.0000 g | PACK | Freq: Every day | ORAL | Status: DC | PRN

## 2021-12-16 MED ORDER — METFORMIN HCL 500 MG PO TABS
500.0000 mg | ORAL_TABLET | Freq: Every day | ORAL | Status: DC
  Administered 2021-12-16: 500 mg via ORAL
  Filled 2021-12-16: qty 1

## 2021-12-16 MED ORDER — SODIUM CHLORIDE 0.9% FLUSH: 3.0000 mL | INTRAVENOUS | Status: DC | PRN

## 2021-12-16 MED ORDER — METOPROLOL SUCCINATE ER 25 MG PO TB24
25.0000 mg | ORAL_TABLET | Freq: Every day | ORAL | Status: DC
  Administered 2021-12-17: 25 mg via ORAL
  Filled 2021-12-16: qty 1

## 2021-12-16 MED ORDER — LEVOTHYROXINE SODIUM 75 MCG PO TABS
75.0000 ug | ORAL_TABLET | ORAL | Status: DC
  Administered 2021-12-17: 75 ug via ORAL
  Filled 2021-12-16: qty 1

## 2021-12-16 MED ORDER — KETOROLAC TROMETHAMINE 30 MG/ML IJ SOLN
INTRAMUSCULAR | Status: DC | PRN
  Administered 2021-12-16: 15 mg via INTRAVENOUS

## 2021-12-16 MED ORDER — BUPIVACAINE HCL (PF) 0.5 % IJ SOLN
INTRAMUSCULAR | Status: AC
  Filled 2021-12-16: qty 30

## 2021-12-16 MED ORDER — 0.9 % SODIUM CHLORIDE (POUR BTL) OPTIME
TOPICAL | Status: DC | PRN
  Administered 2021-12-16: 1000 mL

## 2021-12-16 MED ORDER — DIPHENOXYLATE-ATROPINE 2.5-0.025 MG PO TABS: 1.0000 | ORAL_TABLET | Freq: Four times a day (QID) | ORAL | Status: DC | PRN

## 2021-12-16 MED ORDER — SUGAMMADEX SODIUM 200 MG/2ML IV SOLN
INTRAVENOUS | Status: DC | PRN
  Administered 2021-12-16: 300 mg via INTRAVENOUS

## 2021-12-16 MED ORDER — CEFAZOLIN IN SODIUM CHLORIDE 3-0.9 GM/100ML-% IV SOLN
3.0000 g | INTRAVENOUS | Status: AC
  Administered 2021-12-16 (×2): 3 g via INTRAVENOUS
  Filled 2021-12-16: qty 100

## 2021-12-16 MED ORDER — MENTHOL 3 MG MT LOZG: 1.0000 | LOZENGE | OROMUCOSAL | Status: DC | PRN

## 2021-12-16 MED ORDER — AMISULPRIDE (ANTIEMETIC) 5 MG/2ML IV SOLN
10.0000 mg | Freq: Once | INTRAVENOUS | Status: DC | PRN
Start: 2021-12-16 — End: 2021-12-16

## 2021-12-16 MED ORDER — ALUM & MAG HYDROXIDE-SIMETH 200-200-20 MG/5ML PO SUSP: 30.0000 mL | Freq: Four times a day (QID) | ORAL | Status: DC | PRN

## 2021-12-16 MED ORDER — THROMBIN 5000 UNITS EX SOLR
OROMUCOSAL | Status: DC | PRN
  Administered 2021-12-16 (×2): 5 mL via TOPICAL

## 2021-12-16 MED ORDER — THROMBIN 20000 UNITS EX SOLR
CUTANEOUS | Status: AC
  Filled 2021-12-16: qty 20000

## 2021-12-16 MED ORDER — BUPIVACAINE HCL (PF) 0.5 % IJ SOLN
INTRAMUSCULAR | Status: DC | PRN
  Administered 2021-12-16: 25 mL
  Administered 2021-12-16: 5 mL

## 2021-12-16 MED ORDER — ONDANSETRON HCL 4 MG/2ML IJ SOLN: 4.0000 mg | Freq: Four times a day (QID) | INTRAMUSCULAR | Status: DC | PRN

## 2021-12-16 MED ORDER — OXYCODONE-ACETAMINOPHEN 5-325 MG PO TABS
1.0000 | ORAL_TABLET | ORAL | Status: DC | PRN
  Administered 2021-12-17 (×2): 1 via ORAL
  Filled 2021-12-16 (×2): qty 1

## 2021-12-16 SURGICAL SUPPLY — 72 items
BAG COUNTER SPONGE SURGICOUNT (BAG) ×2 IMPLANT
BASKET BONE COLLECTION (BASKET) ×2 IMPLANT
BLADE BONE MILL MEDIUM (MISCELLANEOUS) ×2 IMPLANT
BLADE CLIPPER SURG (BLADE) ×1 IMPLANT
BONE CANC CHIPS 20CC PCAN1/4 (Bone Implant) ×2 IMPLANT
BUR MATCHSTICK NEURO 3.0 LAGG (BURR) ×2 IMPLANT
CAGE MAS PLIF 9X9X23-8 LUMBAR (Cage) ×2 IMPLANT
CANISTER SUCT 3000ML PPV (MISCELLANEOUS) ×2 IMPLANT
CHIPS CANC BONE 20CC PCAN1/4 (Bone Implant) ×1 IMPLANT
CNTNR URN SCR LID CUP LEK RST (MISCELLANEOUS) ×1 IMPLANT
CONT SPEC 4OZ STRL OR WHT (MISCELLANEOUS) ×1
COVER BACK TABLE 60X90IN (DRAPES) ×2 IMPLANT
DECANTER SPIKE VIAL GLASS SM (MISCELLANEOUS) ×2 IMPLANT
DERMABOND ADVANCED (GAUZE/BANDAGES/DRESSINGS) ×1
DERMABOND ADVANCED .7 DNX12 (GAUZE/BANDAGES/DRESSINGS) ×1 IMPLANT
DEVICE DISSECT PLASMABLAD 3.0S (MISCELLANEOUS) ×1 IMPLANT
DRAPE C-ARM 42X72 X-RAY (DRAPES) ×4 IMPLANT
DRAPE HALF SHEET 40X57 (DRAPES) IMPLANT
DRAPE LAPAROTOMY 100X72X124 (DRAPES) ×2 IMPLANT
DRSG OPSITE POSTOP 4X8 (GAUZE/BANDAGES/DRESSINGS) ×1 IMPLANT
DURAPREP 26ML APPLICATOR (WOUND CARE) ×2 IMPLANT
DURASEAL APPLICATOR TIP (TIP) IMPLANT
DURASEAL SPINE SEALANT 3ML (MISCELLANEOUS) IMPLANT
ELECT REM PT RETURN 9FT ADLT (ELECTROSURGICAL) ×2
ELECTRODE REM PT RTRN 9FT ADLT (ELECTROSURGICAL) ×1 IMPLANT
GAUZE 4X4 16PLY ~~LOC~~+RFID DBL (SPONGE) IMPLANT
GAUZE SPONGE 4X4 12PLY STRL (GAUZE/BANDAGES/DRESSINGS) ×2 IMPLANT
GLOVE BIOGEL PI IND STRL 7.0 (GLOVE) IMPLANT
GLOVE BIOGEL PI IND STRL 7.5 (GLOVE) IMPLANT
GLOVE BIOGEL PI IND STRL 8.5 (GLOVE) ×2 IMPLANT
GLOVE BIOGEL PI INDICATOR 7.0 (GLOVE) ×1
GLOVE BIOGEL PI INDICATOR 7.5 (GLOVE) ×1
GLOVE BIOGEL PI INDICATOR 8.5 (GLOVE) ×2
GLOVE ECLIPSE 7.5 STRL STRAW (GLOVE) ×1 IMPLANT
GLOVE ECLIPSE 8.5 STRL (GLOVE) ×4 IMPLANT
GOWN STRL REUS W/ TWL LRG LVL3 (GOWN DISPOSABLE) IMPLANT
GOWN STRL REUS W/ TWL XL LVL3 (GOWN DISPOSABLE) IMPLANT
GOWN STRL REUS W/TWL 2XL LVL3 (GOWN DISPOSABLE) ×4 IMPLANT
GOWN STRL REUS W/TWL LRG LVL3 (GOWN DISPOSABLE)
GOWN STRL REUS W/TWL XL LVL3 (GOWN DISPOSABLE) ×10
GRAFT BNE CANC CHIPS 1-8 20CC (Bone Implant) IMPLANT
GRAFT BONE PROTEIOS LRG 5CC (Orthopedic Implant) ×1 IMPLANT
HEMOSTAT POWDER KIT SURGIFOAM (HEMOSTASIS) ×2 IMPLANT
KIT BASIN OR (CUSTOM PROCEDURE TRAY) ×2 IMPLANT
KIT GRAFTMAG DEL NEURO DISP (NEUROSURGERY SUPPLIES) IMPLANT
KIT TURNOVER KIT B (KITS) ×2 IMPLANT
MILL BONE PREP (MISCELLANEOUS) ×2 IMPLANT
NDL SPNL 22GX3.5 QUINCKE BK (NEEDLE) IMPLANT
NEEDLE HYPO 22GX1.5 SAFETY (NEEDLE) ×2 IMPLANT
NEEDLE SPNL 22GX3.5 QUINCKE BK (NEEDLE) ×2 IMPLANT
NS IRRIG 1000ML POUR BTL (IV SOLUTION) ×2 IMPLANT
PACK LAMINECTOMY NEURO (CUSTOM PROCEDURE TRAY) ×2 IMPLANT
PAD ARMBOARD 7.5X6 YLW CONV (MISCELLANEOUS) ×6 IMPLANT
PATTIES SURGICAL .5 X1 (DISPOSABLE) ×2 IMPLANT
PLASMABLADE 3.0S (MISCELLANEOUS) ×2
ROD RELINE 0-0 CON M 5.0/6.0MM (Rod) ×2 IMPLANT
ROD RELINE TI LATERAL MED OFF (Rod) ×2 IMPLANT
SCREW LOCK RELINE 5.5 TULIP (Screw) ×6 IMPLANT
SCREW RELINE O POLY 7.5X55MM (Screw) ×2 IMPLANT
SPONGE SURGIFOAM ABS GEL 100 (HEMOSTASIS) ×2 IMPLANT
SPONGE T-LAP 4X18 ~~LOC~~+RFID (SPONGE) IMPLANT
SUT PROLENE 6 0 BV (SUTURE) IMPLANT
SUT VIC AB 1 CT1 18XBRD ANBCTR (SUTURE) ×1 IMPLANT
SUT VIC AB 1 CT1 8-18 (SUTURE) ×1
SUT VIC AB 2-0 CP2 18 (SUTURE) ×2 IMPLANT
SUT VIC AB 3-0 SH 8-18 (SUTURE) ×2 IMPLANT
SUT VIC AB 4-0 RB1 18 (SUTURE) ×2 IMPLANT
SYR 3ML LL SCALE MARK (SYRINGE) ×8 IMPLANT
TOWEL GREEN STERILE (TOWEL DISPOSABLE) ×2 IMPLANT
TOWEL GREEN STERILE FF (TOWEL DISPOSABLE) ×2 IMPLANT
TRAY FOLEY MTR SLVR 16FR STAT (SET/KITS/TRAYS/PACK) ×2 IMPLANT
WATER STERILE IRR 1000ML POUR (IV SOLUTION) ×2 IMPLANT

## 2021-12-16 NOTE — Transfer of Care (Signed)
Immediate Anesthesia Transfer of Care Note  Patient: Matthew Robinson  Procedure(s) Performed: Lumbar two-three Posterior Lumbar Interbody Fusion with Revision of Lumbar three to Lumbar five fusion (Back)  Patient Location: PACU  Anesthesia Type:General  Level of Consciousness: awake, drowsy and patient cooperative  Airway & Oxygen Therapy: Patient Spontanous Breathing and Patient connected to nasal cannula oxygen  Post-op Assessment: Report given to RN, Post -op Vital signs reviewed and stable and Patient moving all extremities X 4  Post vital signs: Reviewed and stable  Last Vitals:  Vitals Value Taken Time  BP 117/58 12/16/21 1223  Temp    Pulse 60 12/16/21 1225  Resp 16 12/16/21 1225  SpO2 94 % 12/16/21 1225  Vitals shown include unvalidated device data.  Last Pain:  Vitals:   12/16/21 0606  TempSrc:   PainSc: 0-No pain         Complications: No notable events documented.

## 2021-12-16 NOTE — Op Note (Signed)
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  Posterior lumbar interbody arthrodesis with peek spacers local autograft allograft and Proteus.  Posterolateral arthrodesis with local autograft allograft and Proteus.  Pedicle screw fixation L2 to previous fixation L3-L5.  Fluoroscopic guidance. Surgeon: Kristeen Miss First Assistant: Duffy Rhody, MD Anesthesia: General endotracheal Indications: Matthew Robinson is a 76 year old individuals had previous surgical decompression and stabilization from L3-L5.  Over the last 2 years or so he has developed significant lumbar spinal stenosis at the level of L2-L3 immediately adjacent to his previous fusion.  He has a slight retrolisthesis and primarily complains of right lumbar radicular pain.  He was advised regarding the need for surgical decompression and arthrodesis.  Procedure: Patient was brought to the operating room supine on the stretcher.  After the smooth induction of general tracheal anesthesia, he was carefully turned prone.  The back was prepped with alcohol DuraPrep and draped in a sterile fashion.  Midline incision was created and carried down to the lumbodorsal fascia and I exposed the top of the previously placed hardware at the pedicle screw of L3.  The rod below this was then cleared to allow placement of a W clamp to secure attachment for her ride diffuse all the way up to L2.  Then the dissection was carried out laterally clearing soft tissue from the interlaminar space and the markedly hypertrophied facets at the L2-L3 level.  The L3 transverse process was isolated along with the pedicle entry site at the L3 vertebrae.  This was then punctured with a high-speed drill and a pedicle probe was  used to ascertain the space and the pedicle itself.  This was verified with fluoroscopic guidance and then two 7.5 x 55 mm pedicle screws were placed into the vertebral body of L2.  Next a laminotomy was created removing the inferior margin lamina of L to out to and including the entirety of the facet at L2-3.  Lateral recess was then carefully dissected and decompressed and the common dural tube was decompressed from significant hypertrophied redundant yellow ligament in this region.  Lateral recesses were noted to be severely stenotic particularly on the right side where there is marked hypertrophy of the superior articular process of L3.  This was removed in a piecemeal fashion and ultimately gave good decompression for the L2 nerve root above.  Once this was achieved the disc base was isolated bilaterally and then that the space was entered with a #15 blade and a combination of curettes and rongeurs was used to complete a total discectomy of the L2-3 space.  Toothed curettes were used to decorticate the endplates both medially and laterally and when all the disc was evacuated the interspace was sized for an appropriately sized spacer it was felt that a 9 x 9 x 23 mm spacer with 8 degrees lordosis would fit best these were packed with autograft allograft and Proteus for 5 cc of Proteus all the autograft that was removed from the laminar arches and 20 cc of cortical cancellous chips that were gram together.  A total of 12 cc of bone graft was packed into the interspace along with the 2 cages.  The remainder of 6 cc a side was packed into the intertransverse spaces for posterolateral arthrodesis then a 0 rod was fashioned  to the appropriate length to fit between the W connector and the pedicle screw of L2 this was placed in the neutral construct.  Final radiographs identified good expansion of the interspace at L2-L3 good reduction of a slight retrolisthesis that had been present and overall alignment was neutral  in the coronal and sagittal planes.  With this the fascia was infused with 25 cc of half percent Marcaine and then the lumbodorsal fascia was closed with #1 Vicryl in interrupted fashion 2-0 Vicryl was used in the subcutaneous tissues 3-0 Vicryl subcuticularly and Dermabond on the skin.  Total blood loss of about 400 cc was noted.  No Cell Saver blood was returned to the patient.

## 2021-12-16 NOTE — Progress Notes (Signed)
Orthopedic Tech Progress Note Patient Details:  Matthew Robinson 07-09-1946 [id number]  Ortho Devices Type of Ortho Device: Lumbar corsett Ortho Device/Splint Location: BACK Ortho Device/Splint Interventions: Ordered   Post Interventions Patient Tolerated: Well Instructions Provided: Care of device  [name] 12/16/2021, 12:55 PM

## 2021-12-16 NOTE — H&P (Signed)
CHIEF COMPLAINT: Right buttock and leg pain with stenosis.  HISTORY OF PRESENT ILLNESS: Matthew Robinson is a 76 year old, right-handed individual, who had been a patient of Dr. Sherwood Gambler.  Years ago, Dr. Sherwood Gambler did a decompression and fusion in 2014.  This was from L3 to L5 for a significant stenosis and a degenerative listhesis at those levels.  Matthew Robinson got along quite well after the surgery for a period of time, but he has had some other medical issues including diabetes, peripheral neuropathy, and some vascular insufficiency into the lower extremities.  Lately, however, he has developed pain in the region of the buttock and down into the right leg, which has been increasingly bothersome.  He notes that it is worse when he is lying down.  When he is up and about, it will tend to ease with time but it has been continually and increasingly problematic.  He was seen by Dr. Susa Day and ultimately, an MRI was performed.  This study and reviewed in the office demonstrates that his decompression and fusion from L3 to L5 has healed nicely and he has an amply patent canal.  At L5-S1, he has some modest spondylitic disease with some minimal facet hypertrophy and slight lateral recess stenosis for the L5 nerve root.  The most striking problem, though, occurs at L2-L3, where there is a moderately severe central canal stenosis with bilateral lateral recess stenosis in the subarticular space at the L2-3 level.  There is marked facet hypertrophy that contributes to the stenosis in addition to a broad-based disc bulge at that level.  Plain x-rays accompany these films and they demonstrate that the patient's overall alignment in the coronal plane is good.  He does have a slight degree of straightening of the lower lumbar spine with decreased lordosis but overall, the sagittal alignment is acceptable, also.  Clinically, the patient notes that the pain again bothers mostly when lying down, but is becoming increasing  in severity.  He notes that he has been on a few trips in the past year and during this time, he will take 10 mg of Prednisone a day, which tends to lessen the pain and makes his activity level acceptable and tolerable.  PAST MEDICAL HISTORY: As noted.  He does have some chronic diabetes, which has been managed with Metformin.  He has had some vascular issues and the cellulitis in the left lower extremity, which has been treated medically.  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  PHYSICAL EXAMINATION: I note that the patient stands straight and erect.  When he walks, he does tend to favor a slight forward stoop of about 10 degrees.  His proximal leg strength reveals good strength in the iliopsoas and the quadriceps.  The tibialis anterior and both the gastrocs demonstrate weakness to a level of 4/5.  He has absent reflexes in the patellae and the Achilles, even with reinforcement.  Peripheral pulses are difficult to obtain in both the posterior tibial and tibialis anterior in both lower extremities.  However, he has good capillary fill and good capillary refill that is evident in the lower extremities.  IMPRESSION: The patient has a moderately severe stenosis at the level of L2-L3.  He has been getting some right lumbar radicular symptoms, primarily when supine.  This has been getting increasingly aggravating and we discussed options for treatment of this process.  The patient has had some relief with the  oral prednisone medication, but given his diabetes, this is not a medication that we would want to continue on with any regularity.  I discussed with him the consideration of a translaminar epidural injection at the level of L2-L3 to place some long-acting steroid in the area to see if this would give him some relief.  This is one option for conservative management of this process.  Again, repeated injections would have to be spaced  out so as not to accumulate any long-term effect of the steroid.  The ultimate option, though, I believe that he will require is surgical decompression of L2-L3 with extension of his arthrodesis to the L2-3 level.  I discussed this with him today.  The procedure would be much like what he had experienced previously, but only at the one level L2-L3 and we would attach the upper pedicle screws to the construct that he has now with either a single rod or an extension device that attaches to the previous fusion.  The surgery itself takes about 3 hours to do.  Patients are oftentimes in the hospital for an overnight.  Thereafter, he would have to wear an external corset for about 6 weeks' time until we know that he is well on the road to healing and after that, he could get more active with his activities without the brace on.  At this point, [name] has a couple of trips scheduled, which he would like to get along with and I did provide a prescription for Prednisone 10 mg, #30, without refills.  We will see how he does with these trips, and it is likely that he would want to proceed with surgery sometime in June.  We will work to schedule that for him.  I do not believe that further efforts at any conservative treatment such as physical therapy or manipulative therapy is likely to yield any improvement for him and ultimately, the surgical intervention is what will be required for his back.

## 2021-12-16 NOTE — Anesthesia Procedure Notes (Signed)
Procedure Name: Intubation Date/Time: 12/16/2021 8:08 AM Performed by: Gaylene Brooks, CRNA Pre-anesthesia Checklist: Patient identified, Emergency Drugs available, Suction available and Patient being monitored Patient Re-evaluated:Patient Re-evaluated prior to induction Oxygen Delivery Method: Circle System Utilized Preoxygenation: Pre-oxygenation with 100% oxygen Induction Type: IV induction Ventilation: Mask ventilation without difficulty and Oral airway inserted - appropriate to patient size Laryngoscope Size: Sabra Heck and 3 Grade View: Grade III Tube type: Oral Tube size: 7.5 mm Number of attempts: 1 Airway Equipment and Method: Stylet and Oral airway Placement Confirmation: ETT inserted through vocal cords under direct vision, positive ETCO2 and breath sounds checked- equal and bilateral Secured at: 23 cm Tube secured with: Tape Dental Injury: Teeth and Oropharynx as per pre-operative assessment

## 2021-12-17 LAB — BASIC METABOLIC PANEL
Anion gap: 12 (ref 5–15)
BUN: 19 mg/dL (ref 8–23)
CO2: 25 mmol/L (ref 22–32)
Calcium: 9.1 mg/dL (ref 8.9–10.3)
Chloride: 98 mmol/L (ref 98–111)
Creatinine, Ser: 0.91 mg/dL (ref 0.61–1.24)
GFR, Estimated: >60 mL/min (ref >60)
Glucose, Bld: 149 mg/dL — ABNORMAL HIGH (ref 70–99)
Potassium: 4.3 mmol/L (ref 3.5–5.1)
Sodium: 135 mmol/L (ref 135–145)

## 2021-12-17 LAB — CBC
HCT: 39.8 % (ref 39.0–52.0)
Hemoglobin: 12.8 g/dL — ABNORMAL LOW (ref 13.0–17.0)
MCH: 31.4 pg (ref 26.0–34.0)
MCHC: 32.2 g/dL (ref 30.0–36.0)
MCV: 97.5 fL (ref 80.0–100.0)
Platelets: 149 10*3/uL — ABNORMAL LOW (ref 150–400)
RBC: 4.08 MIL/uL — ABNORMAL LOW (ref 4.22–5.81)
RDW: 14.2 % (ref 11.5–15.5)
WBC: 7.6 10*3/uL (ref 4.0–10.5)
nRBC: 0 % (ref 0.0–0.2)

## 2021-12-17 LAB — GLUCOSE, CAPILLARY: Glucose-Capillary: 142 mg/dL — ABNORMAL HIGH (ref 70–99)

## 2021-12-17 MED ORDER — OXYCODONE-ACETAMINOPHEN 5-325 MG PO TABS: 1.0000 | ORAL_TABLET | ORAL | 0 refills | Status: DC | PRN

## 2021-12-17 MED ORDER — METHOCARBAMOL 500 MG PO TABS: 500.0000 mg | ORAL_TABLET | Freq: Four times a day (QID) | ORAL | 2 refills | Status: AC | PRN

## 2021-12-17 NOTE — Anesthesia Postprocedure Evaluation (Signed)
Anesthesia Post Note  Patient: Matthew Robinson  Procedure(s) Performed: Lumbar two-three Posterior Lumbar Interbody Fusion with Revision of Lumbar three to Lumbar five fusion (Back)     Patient location during evaluation: PACU Anesthesia Type: General Level of consciousness: awake and alert Pain management: pain level controlled Vital Signs Assessment: post-procedure vital signs reviewed and stable Respiratory status: spontaneous breathing, nonlabored ventilation, respiratory function stable and patient connected to nasal cannula oxygen Cardiovascular status: blood pressure returned to baseline and stable Postop Assessment: no apparent nausea or vomiting Anesthetic complications: no   No notable events documented.  Last Vitals:  Vitals:   12/17/21 0405 12/17/21 0734  BP: 111/64 101/61  Pulse: 83 69  Resp: 20 17  Temp: 37.1 C 36.9 C  SpO2: 98% 99%    Last Pain:  Vitals:   12/17/21 0815  TempSrc:   PainSc: 4                  Tiajuana Amass

## 2021-12-17 NOTE — Progress Notes (Signed)
CPAP set up at patient bedside 10.0 cm H20 per patient comfort. (Wears 14 cm H20) at home. Patient has home mask. Patient is able to place self on/off as needed. Patient aware to call for assistance if needed.

## 2021-12-17 NOTE — TOC Transition Note (Signed)
Transition of Care [facility]) - CM/SW Discharge Note   Patient Details  Name: Matthew Robinson MRN: [id number] Date of Birth: [birth year]  Transition of Care [facility]) CM/SW Contact:  Pollie Friar, RN Phone Number: 12/17/2021, 9:54 AM   Clinical Narrative:    Patient is discharging home with home health services pre-arranged through [place]. Information on the AVS.  Bedside RN will provide any needed DME for home.  Pt has transportation home.   Final next level of care: Home w Home Health Services Barriers to Discharge: No Barriers Identified   Patient Goals and CMS Choice        Discharge Placement                       Discharge Plan and Services                          HH Arranged: PT, OT HH Agency: [facility] Agency Contacted: 12/17/21   Representative spoke with at [place]: Amy  Social Determinants of Health (SDOH) Interventions     Readmission Risk Interventions     View : No data to display.

## 2021-12-17 NOTE — Discharge Summary (Signed)
Physician Discharge Summary  Patient ID: Matthew Robinson MRN: [id number] DOB/AGE: [birth year] 76 y.o.  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  History of fusion L3-L5. Principal Problem:   Lumbar stenosis with neurogenic claudication   Discharged Condition: good  Hospital Course: Patient was admitted to undergo surgical decompression arthrodesis at L2-3 he tolerated surgery well.  Consults: None  Significant Diagnostic Studies: None  Treatments: surgery: See op note  Discharge Exam: Blood pressure 101/61, pulse 69, temperature 98.4 F (36.9 C), temperature source Oral, resp. rate 17, height '6\' 6"'$  (1.981 m), weight 124.7 kg, SpO2 99 %. Incision/Wound: Incision is clean and dry motor function is intact Station and gait is normal  Disposition: Discharge disposition: 01-Home or Self Care       Discharge Instructions     Call MD for:  redness, tenderness, or signs of infection (pain, swelling, redness, odor or green/yellow discharge around incision site)   Complete by: As directed    Call MD for:  severe uncontrolled pain   Complete by: As directed    Call MD for:  temperature >100.4   Complete by: As directed    Diet - low sodium heart healthy   Complete by: As directed    Discharge wound care:   Complete by: As directed    Okay to shower. Do not apply salves or appointments to incision. No heavy lifting with the upper extremities greater than 10 pounds. May resume driving when not requiring pain medication and patient feels comfortable with doing so.   Incentive spirometry RT   Complete by: As directed    Increase activity slowly   Complete by: As directed       Allergies as of 12/17/2021   No Known Allergies      Medication List     TAKE these medications    amoxicillin 500 MG capsule Commonly known  as: AMOXIL Take 2,000 mg by mouth once.   diphenoxylate-atropine 2.5-0.025 MG tablet Commonly known as: LOMOTIL Take 1-2 tablets by mouth 4 (four) times daily as needed for diarrhea or loose stools.   ELIGARD Bartelso Inject 1 application. into the skin every 6 (six) months.   famotidine 20 MG tablet Commonly known as: PEPCID Take 20 mg by mouth daily as needed for heartburn.   furosemide 40 MG tablet Commonly known as: LASIX Take 1 tablet (40 mg total) by mouth daily. Pt needs to keep upcoming appt in Jan, 2023 for further refills What changed:  when to take this additional instructions   ibuprofen 200 MG tablet Commonly known as: ADVIL Take 600 mg by mouth in the morning.   levothyroxine 75 MCG tablet Commonly known as: SYNTHROID Take 75 mcg by mouth every other day. Alternates with 50 mcg strength at 0400 or 0500   levothyroxine 50 MCG tablet Commonly known as: SYNTHROID Take 50 mcg by mouth every other day. Alternates with 75 mcg strength at 0400   metFORMIN 500 MG tablet Commonly known as: GLUCOPHAGE Take 500 mg by mouth at bedtime.   methocarbamol 500 MG tablet Commonly known as: ROBAXIN Take 1 tablet (500 mg total) by mouth every 6 (six) hours as needed for muscle spasms.   metoprolol succinate 25 MG 24 hr tablet Commonly known as: TOPROL-XL TAKE 1 TABLET BY MOUTH ONCE DAILY WITH A MEAL OR IMMEDIATELY FOLLOWING A MEAL.   multivitamin with minerals tablet Take  1 tablet by mouth 3 (three) times a week.   PRESERVISION AREDS 2 PO Take 1 tablet by mouth at bedtime.   mupirocin ointment 2 % Commonly known as: BACTROBAN Apply 1 application topically 2 (two) times daily. What changed:  when to take this reasons to take this   oxyCODONE-acetaminophen 5-325 MG tablet Commonly known as: PERCOCET/ROXICET Take 1-2 tablets by mouth every 4 (four) hours as needed for moderate pain or severe pain.   predniSONE 10 MG tablet Commonly known as: DELTASONE Take 10 mg by  mouth daily as needed (When only on vacation at 0400).   simvastatin 20 MG tablet Commonly known as: ZOCOR Take 20 mg by mouth 3 (three) times a week. At bedtime   TURMERIC PO Take 1 tablet by mouth 3 (three) times a week. Morning   Vitamin D 50 MCG (2000 UT) tablet Take 2,000 Units by mouth 3 (three) times a week.               Discharge Care Instructions  (From admission, onward)           Start     Ordered   12/17/21 0000  Discharge wound care:       Comments: Okay to shower. Do not apply salves or appointments to incision. No heavy lifting with the upper extremities greater than 10 pounds. May resume driving when not requiring pain medication and patient feels comfortable with doing so.   12/17/21 0846             Signed: Blanchie Dessert Ancelmo Hunt 12/17/2021, 8:47 AM

## 2021-12-17 NOTE — Plan of Care (Signed)
Pt doing well. Pt and wife given D/C instructions with verbal understanding. Rx's were sent to the pharmacy by MD. Pt's incision is clean and dry with no sign of infection. Pt's IV was removed prior to D/C. Home Health was arranged per MD order. Pt received RW and 3-n-1 from [facility] per MD order. Pt D/C'd home via wheelchair per MD order. Pt is stable @ D/C and has no other needs at this time. Holli Humbles, RN

## 2021-12-17 NOTE — Evaluation (Signed)
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 S/p bilateral laminectomies L2-3 with decompression. PMH includes DM2.   Clinical Impression   Pt reports using cane at baseline for mobility, and PRN assist with LB ADLs. Pt lives with wife who can provide assist at d/c. Pt currently mod I - mod A for ADLs, min A for bed mobility, and min guard for transfers with RW. Pt with difficulty with sit <> stand transitions due to back pain at this time. Educated pt on compensatory strategies for LB dressing, grooming, brace wear, bed mobility, showering, and back precautions. Pt verbalized understanding, adheres to precautions well throughout session. Pt educated on use of AE including toileting aid for wiping and long handle sponge. Pt presenting with impairments listed below, will follow acutely. Recommend HHOT at d/c.     Recommendations for follow up therapy are one component of a multi-disciplinary discharge planning process, led by the attending physician.  Recommendations may be updated based on patient status, additional functional criteria and insurance authorization.   Follow Up Recommendations  Home health OT    Assistance Recommended at Discharge Intermittent Supervision/Assistance  Patient can return home with the following A little help with walking and/or transfers;A lot of help with bathing/dressing/bathroom;Help with stairs or ramp for entrance;Assist for transportation;Assistance with cooking/housework    Functional Status Assessment  Patient has had a recent decline in their functional status and demonstrates the ability to make significant improvements in function in a reasonable and predictable amount of time.  Equipment Recommendations  BSC/3in1;Other (comment) (RW)    Recommendations for  Other Services PT consult     Precautions / Restrictions Precautions Precautions: Back Precaution Booklet Issued: Yes (comment) Precaution Comments: educated pt on 3/3 back precautions Required Braces or Orthoses: Spinal Brace Spinal Brace: Lumbar corset Restrictions Weight Bearing Restrictions: No      Mobility Bed Mobility Overal bed mobility: Needs Assistance Bed Mobility: Sidelying to Sit, Sit to Sidelying   Sidelying to sit: Min assist     Sit to sidelying: Min assist General bed mobility comments: for log rolling technique    Transfers Overall transfer level: Needs assistance Equipment used: Rolling walker (2 wheels) Transfers: Sit to/from Stand Sit to Stand: Min guard                  Balance Overall balance assessment: Needs assistance Sitting-balance support: Feet supported Sitting balance-Leahy Scale: Good Sitting balance - Comments: can reach outside BOS without LOB   Standing balance support: During functional activity, Reliant on assistive device for balance Standing balance-Leahy Scale: Fair Standing balance comment: reliant on external support                           ADL either performed or assessed with clinical judgement   ADL Overall ADL's : Needs assistance/impaired Eating/Feeding: Modified independent;Sitting   Grooming: Modified independent;Standing;Sitting   Upper Body Bathing: Minimal assistance;Sitting   Lower Body Bathing: Sitting/lateral leans;Sit to/from stand;Moderate assistance   Upper Body Dressing : Sitting;Standing;Moderate assistance Upper Body Dressing Details (indicate cue type and reason): to don shirt/brace Lower Body Dressing: Sitting/lateral leans;Sit to/from stand;Moderate assistance;Adhering to back precautions Lower Body Dressing Details (indicate cue type and reason): to don pants/undergarments Toilet Transfer: Min guard;Rolling walker (2 wheels);Ambulation;Regular Glass blower/designer Details  (indicate  cue type and reason): simulated in room Toileting- Clothing Manipulation and Hygiene: Supervision/safety;Sitting/lateral lean;Sit to/from stand;Adhering to back precautions       Functional mobility during ADLs: Min guard;Rolling walker (2 wheels)       Vision   Vision Assessment?: No apparent visual deficits     Perception     Praxis      Pertinent Vitals/Pain Pain Assessment Pain Assessment: Faces Pain Score: 8  Faces Pain Scale: Hurts whole lot Pain Location: low back incision Pain Descriptors / Indicators: Constant, Discomfort Pain Intervention(s): Limited activity within patient's tolerance, Repositioned, Monitored during session     Hand Dominance     Extremity/Trunk Assessment Upper Extremity Assessment Upper Extremity Assessment: Generalized weakness   Lower Extremity Assessment Lower Extremity Assessment: Defer to PT evaluation   Cervical / Trunk Assessment Cervical / Trunk Assessment: Normal   Communication Communication Communication: No difficulties   Cognition Arousal/Alertness: Awake/alert Behavior During Therapy: WFL for tasks assessed/performed Overall Cognitive Status: Within Functional Limits for tasks assessed                                       General Comments  VSS on RA    Exercises     Shoulder Instructions      Home Living Family/patient expects to be discharged to:: Private residence Living Arrangements: Spouse/significant other Available Help at Discharge: Family;Available 24 hours/day Type of Home: House Home Access: Stairs to enter [employer] of Steps: 2   Home Layout: One level     Bathroom Shower/Tub: Walk-in shower;Door   Bathroom Toilet: Handicapped height     Home Equipment: [place] - single point;Shower seat - built in          Prior Functioning/Environment Prior Level of Function : Independent/Modified Independent             Mobility Comments: using cane ADLs  Comments: PRN assist for LB ADLs        OT Problem List: Decreased strength;Decreased range of motion;Decreased activity tolerance;Decreased knowledge of use of DME or AE;Decreased safety awareness;Impaired balance (sitting and/or standing)      OT Treatment/Interventions: Self-care/ADL training;Therapeutic exercise;Energy conservation;DME and/or AE instruction;Therapeutic activities;Patient/family education;Balance training    OT Goals(Current goals can be found in the care plan section) Acute Rehab OT Goals Patient Stated Goal: to go home OT Goal Formulation: With patient Time For Goal Achievement: 12/31/21 Potential to Achieve Goals: Good ADL Goals Pt Will Perform Lower Body Dressing: with supervision;sit to/from stand;sitting/lateral leans Pt Will Perform Toileting - Clothing Manipulation and hygiene: sitting/lateral leans;with adaptive equipment;sit to/from stand;with supervision Pt Will Perform Tub/Shower Transfer: Shower transfer;rolling walker;ambulating;shower seat;with supervision  OT Frequency: Min 2X/week    Co-evaluation              AM-PAC OT "6 Clicks" Daily Activity     Outcome Measure Help from another person eating meals?: None Help from another person taking care of personal grooming?: None Help from another person toileting, which includes using toliet, bedpan, or urinal?: A Little Help from another person bathing (including washing, rinsing, drying)?: A Lot Help from another person to put on and taking off regular upper body clothing?: A Lot Help from another person to put on and taking off regular lower body clothing?: A Lot 6 Click Score: 17   End of Session Equipment Utilized During Treatment: Rolling walker (2 wheels);Gait belt;Back brace Nurse Communication: Mobility status  Activity Tolerance: Patient tolerated treatment well Patient left: in bed;with call bell/phone within reach  OT Visit Diagnosis: Unsteadiness on feet (R26.81);Other  abnormalities of gait and mobility (R26.89);Muscle weakness (generalized) (M62.81)                Time: 0677-0340 OT Time Calculation (min): 28 min Charges:  OT General Charges $OT Visit: 1 Visit OT Evaluation $OT Eval Low Complexity: 1 Low OT Treatments $Self Care/Home Management : 8-22 mins  Lynnda Child, OTD, OTR/L Acute Rehab [phone]) [phone] [place] 12/17/2021, 9:07 AM

## 2021-12-17 NOTE — Evaluation (Signed)
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 S/p bilateral laminectomies L2-3 with decompression. PMH includes DM2.  Clinical Impression  Patient admitted following above procedure. Patient functioning at supervision level for ambulation with use of RW. Patient limited by weakness, impaired balance, and decreased activity tolerance. Able to negotiate 2 stairs to safely access home with min guard-supervision. Educated patient on back precautions, brace wear, and activity progression, patient verbalized understanding. Patient will have wife at home to assist at discharge. All further needs can be deferred to HHPT at discharge. No skilled PT needs required acutely.        Recommendations for follow up therapy are one component of a multi-disciplinary discharge planning process, led by the attending physician.  Recommendations may be updated based on patient status, additional functional criteria and insurance authorization.  Follow Up Recommendations Home health PT    Assistance Recommended at Discharge Frequent or constant Supervision/Assistance  Patient can return home with the following  A little help with walking and/or transfers;A little help with bathing/dressing/bathroom;Help with stairs or ramp for entrance;Assistance with cooking/housework    Equipment Recommendations Rolling Laureano Hetzer (2 wheels);BSC/3in1  Recommendations for Other Services       Functional Status Assessment Patient has had a recent decline in their functional status and demonstrates the ability to make significant improvements in function in a reasonable and predictable amount of time.     Precautions / Restrictions Precautions Precautions: Back Precaution Booklet Issued: Yes (comment) Precaution Comments: educated pt on 3/3 back  precautions Required Braces or Orthoses: Spinal Brace Spinal Brace: Lumbar corset Restrictions Weight Bearing Restrictions: No      Mobility  Bed Mobility Overal bed mobility: Needs Assistance Bed Mobility: Sit to Sidelying         Sit to sidelying: Min assist General bed mobility comments: for log rolling technique    Transfers Overall transfer level: Needs assistance Equipment used: Rolling Jasmyne Lodato (2 wheels) Transfers: Sit to/from Stand Sit to Stand: Supervision                Ambulation/Gait Ambulation/Gait assistance: Supervision Gait Distance (Feet): 120 Feet Assistive device: Rolling Royer Cristobal (2 wheels) Gait Pattern/deviations: Step-through pattern, Decreased stride length, Knee flexed in stance - right, Knee flexed in stance - left Gait velocity: decrease     General Gait Details: supervision for safety. Ambulates in a more crouched position. Cues for upright posture  Stairs Stairs: Yes Stairs assistance: Min guard Stair Management: Two rails, Step to pattern, Forwards Number of Stairs: 2 General stair comments: min guard for safety. Instructed on up with stronger leg first and down with weaker leg  Wheelchair Mobility    Modified Rankin (Stroke Patients Only)       Balance Overall balance assessment: Needs assistance Sitting-balance support: Feet supported Sitting balance-Leahy Scale: Good     Standing balance support: During functional activity, Reliant on assistive device for balance Standing balance-Leahy Scale: Fair Standing balance comment: reliant on external support                             Pertinent Vitals/Pain Pain Assessment Pain Assessment: Faces Faces Pain Scale: Hurts whole lot Pain Location: low back incision Pain Descriptors / Indicators: Constant, Discomfort Pain Intervention(s): Monitored during session, Repositioned    Home Living Family/patient  expects to be discharged to:: Private residence Living  Arrangements: Spouse/significant other Available Help at Discharge: Family;Available 24 hours/day Type of Home: House Home Access: Stairs to enter   [employer] of Steps: 2   Home Layout: One level Home Equipment: Cane - single point;Shower seat - built in      Prior Function Prior Level of Function : Independent/Modified Independent             Mobility Comments: using cane ADLs Comments: PRN assist for LB ADLs     Hand Dominance        Extremity/Trunk Assessment   Upper Extremity Assessment Upper Extremity Assessment: Defer to OT evaluation    Lower Extremity Assessment Lower Extremity Assessment: Generalized weakness    Cervical / Trunk Assessment Cervical / Trunk Assessment: Normal  Communication   Communication: No difficulties  Cognition Arousal/Alertness: Awake/alert Behavior During Therapy: WFL for tasks assessed/performed Overall Cognitive Status: Within Functional Limits for tasks assessed                                          General Comments General comments (skin integrity, edema, etc.): VSS on RA    Exercises     Assessment/Plan    PT Assessment All further PT needs can be met in the next venue of care  PT Problem List Decreased strength;Decreased activity tolerance;Decreased balance;Decreased mobility;Decreased knowledge of precautions       PT Treatment Interventions      PT Goals (Current goals can be found in the Care Plan section)  Acute Rehab PT Goals Patient Stated Goal: to go home PT Goal Formulation: With patient    Frequency       Co-evaluation               AM-PAC PT "6 Clicks" Mobility  Outcome Measure Help needed turning from your back to your side while in a flat bed without using bedrails?: A Little Help needed moving from lying on your back to sitting on the side of a flat bed without using bedrails?: A Little Help needed moving to and from a bed to a chair (including a  wheelchair)?: A Little Help needed standing up from a chair using your arms (e.g., wheelchair or bedside chair)?: A Little Help needed to walk in hospital room?: A Little Help needed climbing 3-5 steps with a railing? : A Little 6 Click Score: 18    End of Session Equipment Utilized During Treatment: Back brace Activity Tolerance: Patient tolerated treatment well Patient left: in bed;with call bell/phone within reach Nurse Communication: Mobility status PT Visit Diagnosis: Unsteadiness on feet (R26.81);Muscle weakness (generalized) (M62.81)    Time: 6222-9798 PT Time Calculation (min) (ACUTE ONLY): 17 min   Charges:   PT Evaluation $PT Eval Low Complexity: 1 Low          Alphia Behanna A. Gilford Rile PT, DPT Acute Rehabilitation Services Pager [phone] Office [phone]   Linna Hoff 12/17/2021, 11:53 AM

## 2021-12-19 DIAGNOSIS — E039 Hypothyroidism, unspecified: Secondary | ICD-10-CM | POA: Diagnosis not present

## 2021-12-19 DIAGNOSIS — I110 Hypertensive heart disease with heart failure: Secondary | ICD-10-CM | POA: Diagnosis not present

## 2021-12-19 DIAGNOSIS — Z981 Arthrodesis status: Secondary | ICD-10-CM | POA: Diagnosis not present

## 2021-12-19 DIAGNOSIS — I4892 Unspecified atrial flutter: Secondary | ICD-10-CM | POA: Diagnosis not present

## 2021-12-19 DIAGNOSIS — Z7984 Long term (current) use of oral hypoglycemic drugs: Secondary | ICD-10-CM | POA: Diagnosis not present

## 2021-12-19 DIAGNOSIS — N400 Enlarged prostate without lower urinary tract symptoms: Secondary | ICD-10-CM | POA: Diagnosis not present

## 2021-12-19 DIAGNOSIS — M1990 Unspecified osteoarthritis, unspecified site: Secondary | ICD-10-CM | POA: Diagnosis not present

## 2021-12-19 DIAGNOSIS — C439 Malignant melanoma of skin, unspecified: Secondary | ICD-10-CM | POA: Diagnosis not present

## 2021-12-19 DIAGNOSIS — C61 Malignant neoplasm of prostate: Secondary | ICD-10-CM | POA: Diagnosis not present

## 2021-12-19 DIAGNOSIS — E1142 Type 2 diabetes mellitus with diabetic polyneuropathy: Secondary | ICD-10-CM | POA: Diagnosis not present

## 2021-12-19 DIAGNOSIS — I5032 Chronic diastolic (congestive) heart failure: Secondary | ICD-10-CM | POA: Diagnosis not present

## 2021-12-19 DIAGNOSIS — Z9989 Dependence on other enabling machines and devices: Secondary | ICD-10-CM | POA: Diagnosis not present

## 2021-12-19 DIAGNOSIS — D696 Thrombocytopenia, unspecified: Secondary | ICD-10-CM | POA: Diagnosis not present

## 2021-12-19 DIAGNOSIS — E7801 Familial hypercholesterolemia: Secondary | ICD-10-CM | POA: Diagnosis not present

## 2021-12-19 DIAGNOSIS — Z6831 Body mass index (BMI) 31.0-31.9, adult: Secondary | ICD-10-CM | POA: Diagnosis not present

## 2021-12-19 DIAGNOSIS — G4733 Obstructive sleep apnea (adult) (pediatric): Secondary | ICD-10-CM | POA: Diagnosis not present

## 2021-12-19 DIAGNOSIS — I872 Venous insufficiency (chronic) (peripheral): Secondary | ICD-10-CM | POA: Diagnosis not present

## 2021-12-19 DIAGNOSIS — K219 Gastro-esophageal reflux disease without esophagitis: Secondary | ICD-10-CM | POA: Diagnosis not present

## 2021-12-19 DIAGNOSIS — E669 Obesity, unspecified: Secondary | ICD-10-CM | POA: Diagnosis not present

## 2021-12-19 DIAGNOSIS — Z4789 Encounter for other orthopedic aftercare: Secondary | ICD-10-CM | POA: Diagnosis not present

## 2021-12-19 MED FILL — Sodium Chloride IV Soln 0.9%: INTRAVENOUS | Qty: 1000 | Status: AC

## 2021-12-19 MED FILL — Heparin Sodium (Porcine) Inj 1000 Unit/ML: INTRAMUSCULAR | Qty: 30 | Status: AC

## 2021-12-23 DIAGNOSIS — E1142 Type 2 diabetes mellitus with diabetic polyneuropathy: Secondary | ICD-10-CM | POA: Diagnosis not present

## 2021-12-23 DIAGNOSIS — I110 Hypertensive heart disease with heart failure: Secondary | ICD-10-CM | POA: Diagnosis not present

## 2021-12-23 DIAGNOSIS — I5032 Chronic diastolic (congestive) heart failure: Secondary | ICD-10-CM | POA: Diagnosis not present

## 2021-12-23 DIAGNOSIS — Z981 Arthrodesis status: Secondary | ICD-10-CM | POA: Diagnosis not present

## 2021-12-23 DIAGNOSIS — Z4789 Encounter for other orthopedic aftercare: Secondary | ICD-10-CM | POA: Diagnosis not present

## 2021-12-23 DIAGNOSIS — Z7984 Long term (current) use of oral hypoglycemic drugs: Secondary | ICD-10-CM | POA: Diagnosis not present

## 2021-12-24 DIAGNOSIS — E1142 Type 2 diabetes mellitus with diabetic polyneuropathy: Secondary | ICD-10-CM | POA: Diagnosis not present

## 2021-12-24 DIAGNOSIS — Z7984 Long term (current) use of oral hypoglycemic drugs: Secondary | ICD-10-CM | POA: Diagnosis not present

## 2021-12-24 DIAGNOSIS — I5032 Chronic diastolic (congestive) heart failure: Secondary | ICD-10-CM | POA: Diagnosis not present

## 2021-12-24 DIAGNOSIS — Z4789 Encounter for other orthopedic aftercare: Secondary | ICD-10-CM | POA: Diagnosis not present

## 2021-12-24 DIAGNOSIS — Z981 Arthrodesis status: Secondary | ICD-10-CM | POA: Diagnosis not present

## 2021-12-24 DIAGNOSIS — I110 Hypertensive heart disease with heart failure: Secondary | ICD-10-CM | POA: Diagnosis not present

## 2021-12-25 DIAGNOSIS — I110 Hypertensive heart disease with heart failure: Secondary | ICD-10-CM | POA: Diagnosis not present

## 2021-12-25 DIAGNOSIS — Z4789 Encounter for other orthopedic aftercare: Secondary | ICD-10-CM | POA: Diagnosis not present

## 2021-12-25 DIAGNOSIS — E1142 Type 2 diabetes mellitus with diabetic polyneuropathy: Secondary | ICD-10-CM | POA: Diagnosis not present

## 2021-12-25 DIAGNOSIS — I5032 Chronic diastolic (congestive) heart failure: Secondary | ICD-10-CM | POA: Diagnosis not present

## 2021-12-25 DIAGNOSIS — Z7984 Long term (current) use of oral hypoglycemic drugs: Secondary | ICD-10-CM | POA: Diagnosis not present

## 2021-12-25 DIAGNOSIS — Z981 Arthrodesis status: Secondary | ICD-10-CM | POA: Diagnosis not present

## 2021-12-29 DIAGNOSIS — Z7984 Long term (current) use of oral hypoglycemic drugs: Secondary | ICD-10-CM | POA: Diagnosis not present

## 2021-12-29 DIAGNOSIS — I5032 Chronic diastolic (congestive) heart failure: Secondary | ICD-10-CM | POA: Diagnosis not present

## 2021-12-29 DIAGNOSIS — Z4789 Encounter for other orthopedic aftercare: Secondary | ICD-10-CM | POA: Diagnosis not present

## 2021-12-29 DIAGNOSIS — E1142 Type 2 diabetes mellitus with diabetic polyneuropathy: Secondary | ICD-10-CM | POA: Diagnosis not present

## 2021-12-29 DIAGNOSIS — I110 Hypertensive heart disease with heart failure: Secondary | ICD-10-CM | POA: Diagnosis not present

## 2021-12-29 DIAGNOSIS — Z981 Arthrodesis status: Secondary | ICD-10-CM | POA: Diagnosis not present

## 2021-12-31 DIAGNOSIS — Z4789 Encounter for other orthopedic aftercare: Secondary | ICD-10-CM | POA: Diagnosis not present

## 2021-12-31 DIAGNOSIS — I5032 Chronic diastolic (congestive) heart failure: Secondary | ICD-10-CM | POA: Diagnosis not present

## 2021-12-31 DIAGNOSIS — Z981 Arthrodesis status: Secondary | ICD-10-CM | POA: Diagnosis not present

## 2021-12-31 DIAGNOSIS — E1142 Type 2 diabetes mellitus with diabetic polyneuropathy: Secondary | ICD-10-CM | POA: Diagnosis not present

## 2021-12-31 DIAGNOSIS — Z7984 Long term (current) use of oral hypoglycemic drugs: Secondary | ICD-10-CM | POA: Diagnosis not present

## 2021-12-31 DIAGNOSIS — I110 Hypertensive heart disease with heart failure: Secondary | ICD-10-CM | POA: Diagnosis not present

## 2022-01-07 DIAGNOSIS — Z6832 Body mass index (BMI) 32.0-32.9, adult: Secondary | ICD-10-CM | POA: Diagnosis not present

## 2022-01-07 DIAGNOSIS — M5136 Other intervertebral disc degeneration, lumbar region: Secondary | ICD-10-CM | POA: Diagnosis not present

## 2022-01-19 ENCOUNTER — Other Ambulatory Visit: Payer: Self-pay | Admitting: Internal Medicine

## 2022-01-19 ENCOUNTER — Ambulatory Visit
Admission: RE | Admit: 2022-01-19 | Discharge: 2022-01-19 | Disposition: A | Discharge status: Home / Self Care | Payer: Medicare Other | Source: Ambulatory Visit | Attending: Internal Medicine | Admitting: Internal Medicine

## 2022-01-19 DIAGNOSIS — R609 Edema, unspecified: Secondary | ICD-10-CM

## 2022-01-19 DIAGNOSIS — Z8546 Personal history of malignant neoplasm of prostate: Secondary | ICD-10-CM | POA: Diagnosis not present

## 2022-01-19 DIAGNOSIS — R600 Localized edema: Secondary | ICD-10-CM | POA: Diagnosis not present

## 2022-01-19 DIAGNOSIS — I509 Heart failure, unspecified: Secondary | ICD-10-CM | POA: Diagnosis not present

## 2022-01-27 DIAGNOSIS — R609 Edema, unspecified: Secondary | ICD-10-CM | POA: Diagnosis not present

## 2022-01-27 DIAGNOSIS — L0390 Cellulitis, unspecified: Secondary | ICD-10-CM | POA: Diagnosis not present

## 2022-01-27 DIAGNOSIS — I872 Venous insufficiency (chronic) (peripheral): Secondary | ICD-10-CM | POA: Diagnosis not present

## 2022-01-27 DIAGNOSIS — I509 Heart failure, unspecified: Secondary | ICD-10-CM | POA: Diagnosis not present

## 2022-01-30 DIAGNOSIS — I509 Heart failure, unspecified: Secondary | ICD-10-CM | POA: Diagnosis not present

## 2022-01-30 DIAGNOSIS — R609 Edema, unspecified: Secondary | ICD-10-CM | POA: Diagnosis not present

## 2022-02-05 ENCOUNTER — Other Ambulatory Visit: Payer: Self-pay | Admitting: *Deleted

## 2022-02-05 ENCOUNTER — Other Ambulatory Visit: Payer: Self-pay

## 2022-02-05 ENCOUNTER — Ambulatory Visit (HOSPITAL_COMMUNITY)
Admission: RE | Admit: 2022-02-05 | Discharge: 2022-02-05 | Disposition: A | Discharge status: Home / Self Care | Payer: Medicare Other | Source: Ambulatory Visit | Attending: Vascular Surgery | Admitting: Vascular Surgery

## 2022-02-05 DIAGNOSIS — I872 Venous insufficiency (chronic) (peripheral): Secondary | ICD-10-CM | POA: Diagnosis not present

## 2022-02-08 NOTE — Progress Notes (Unsigned)
[facility] [place]  ASSESSMENT / PLAN: Matthew Robinson is a 76 y.o. male with chronic venous insufficiency of left lower extremity causing swelling. Venous duplex is significant for deep venous reflux without superficial reflux. Recommend compression and elevation for symptomatic relief. I counseled him that a venous intervention would not likely improve his symptoms. Follow up with me as needed.   CHIEF COMPLAINT: Left leg swollen  HISTORY OF PRESENT ILLNESS: Matthew Robinson is a 76 y.o. male referred to clinic for evaluation of lower extremity swelling, left worse than right.  He has an established diagnosis of congestive heart failure, and has been managed on diuretics by his primary care physician for lower extremity swelling.  He has already started compression therapy, which has been improving his symptoms.  He is reported he is needed frequent doses of torsemide to control his swelling.  We reviewed his duplex ultrasound in detail and I counseled him about the natural history of chronic venous insufficiency and the rationale for intervention and superficial venous reflux.  Past Medical History:  Diagnosis Date   Chronic right-sided CHF (congestive heart failure) ([place])    Echo 11/17: EF 55 and 60, normal wall motion, trivial AI, moderate RVE, mildly reduced RVSF, moderate to severe RAE // Echo 6/18: EF 60-65, Gr 1 DD, trivial AI, mild RVE, mild RAE.   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 MOHS x 3.    Mild aortic insufficiency    Mitral valve prolapse    hx of - seen years ago by Dr Enid Derry in [facility], [place] - not seen on recent echoes   Prostate cancer [facility])    Sleep apnea    cpap 16     Past Surgical History:  Procedure Laterality Date   A-FLUTTER ABLATION N/A 12/14/2019   Procedure: A-FLUTTER ABLATION;  Surgeon: Evans Lance, MD;  Location: [place] [facility];  Service: Cardiovascular;  Laterality: N/A;   CATARACT EXTRACTION Left    COLONOSCOPY     polyps in past   COLONOSCOPY WITH PROPOFOL N/A 08/14/2014   Procedure: COLONOSCOPY WITH PROPOFOL;  Surgeon: Garlan Fair, MD;  Location: [facility] ENDOSCOPY;  Service: Endoscopy;  Laterality: N/A;   MELANOMA EXCISION     MENISCUS REPAIR Left 1970   RETINAL DETACHMENT SURGERY     RIGHT HEART CATH N/A 05/26/2018   Procedure: RIGHT HEART CATH;  Surgeon: Larey Dresser, MD;  Location: [place] [facility];  Service: Cardiovascular;  Laterality: N/A;   SPINE SURGERY     4'14 lumbar 3,4,5 fusion(Bronaugh-Nudelman)   TONSILLECTOMY      Family History  Problem Relation Age of Onset   Heart failure Mother    Heart disease Mother    Atrial fibrillation Mother    Mitral valve prolapse Mother    Breast cancer Mother    Hypertension Father    Heart attack Father        died from MI during CA surgery   Cancer Father    Lymphoma Father    Cancer Maternal Grandfather    Prostate cancer Neg Hx    Colon cancer Neg Hx    Pancreatic cancer Neg Hx  Social History   Socioeconomic History   Marital status: Married    Spouse name: Not on file   Number of children: Not on file   Years of education: Not on file   Highest education level: Not on file  Occupational History   Not on file  Tobacco Use   Smoking status: Never   Smokeless tobacco: Never  Vaping Use   Vaping Use: Never used  Substance and Sexual Activity   Alcohol use: Yes    Alcohol/week: 1.0 - 3.0 standard drink of alcohol    Types: 1 - 3 Glasses of wine per week    Comment: x2 weekly    Drug use: No   Sexual activity: Yes  Other Topics Concern   Not on file  Social History Narrative   Married   1 daughter - deceased   Software engineer - retired;  works a little   Social Determinants of Radio broadcast assistant Strain: Not on Art therapist Insecurity: Not on file  Transportation Needs: Not on file  Physical Activity: Not on file  Stress: Not on file  Social Connections: Not on file  Intimate Partner Violence: Not on file    No Known Allergies  Current Outpatient Medications  Medication Sig Dispense Refill   amoxicillin (AMOXIL) 500 MG capsule Take 2,000 mg by mouth once.     Cholecalciferol (VITAMIN D) 2000 UNITS tablet Take 2,000 Units by mouth 3 (three) times a week.     diphenoxylate-atropine (LOMOTIL) 2.5-0.025 MG tablet Take 1-2 tablets by mouth 4 (four) times daily as needed for diarrhea or loose stools. 40 tablet 5   famotidine (PEPCID) 20 MG tablet Take 20 mg by mouth daily as needed for heartburn.      ibuprofen (ADVIL) 200 MG tablet Take 600 mg by mouth in the morning.     Leuprolide Acetate (ELIGARD [place]) Inject 1 application. into the skin every 6 (six) months.     levothyroxine (SYNTHROID, LEVOTHROID) 50 MCG tablet Take 50 mcg by mouth every other day. Alternates with 75 mcg strength at 0400     levothyroxine (SYNTHROID, LEVOTHROID) 75 MCG tablet Take 75 mcg by mouth every other day. Alternates with 50 mcg strength at 0400 or 0500     metFORMIN (GLUCOPHAGE) 500 MG tablet Take 500 mg by mouth at bedtime.   2   methocarbamol (ROBAXIN) 500 MG tablet Take 1 tablet (500 mg total) by mouth every 6 (six) hours as needed for muscle spasms. 30 tablet 2   metoprolol succinate (TOPROL-XL) 25 MG 24 hr tablet TAKE 1 TABLET BY MOUTH ONCE DAILY WITH A MEAL OR IMMEDIATELY FOLLOWING A MEAL. 90 tablet 3   Multiple Vitamins-Minerals (PRESERVISION AREDS 2 PO) Take 1 tablet by mouth at bedtime.     mupirocin ointment (BACTROBAN) 2 % Apply 1 application topically 2 (two) times daily. (Patient taking differently: Apply 1 application  topically daily as needed (Skin infection).) 30 g 2   predniSONE (DELTASONE) 10 MG tablet Take 10 mg by mouth  daily as needed (When only on vacation at 0400).     simvastatin (ZOCOR) 20 MG tablet Take 20 mg by mouth 3 (three) times a week. At bedtime     TURMERIC PO Take 1 tablet by mouth 3 (three) times a week. Morning     torsemide (DEMADEX) 20 MG tablet Take 20 mg by mouth daily as needed.     No current facility-administered medications for this visit.    PHYSICAL EXAM  Vitals:   02/09/22 0936  BP: 108/65  Pulse: 69  Resp: 20  SpO2: 95%  Weight: 278 lb (126.1 kg)  Height: '6\' 6"'$  (1.981 m)    Constitutional: Well-appearing elderly man in no acute distress Cardiac: Regular rate and rhythm.  Respiratory:  unlabored. Lower extremity: 2+ edema about the left lower extremity.  Trace edema about the right lower extremity.  No other stigmata of chronic venous insufficiency   PERTINENT LABORATORY AND RADIOLOGIC DATA  Most recent CBC    Latest Ref Rng & Units 12/17/2021    6:55 AM 12/10/2021    9:33 AM 11/17/2019    4:04 PM  CBC  WBC 4.0 - 10.5 K/uL 7.6  4.1  6.7   Hemoglobin 13.0 - 17.0 g/dL 12.8  13.2  15.5   Hematocrit 39.0 - 52.0 % 39.8  40.7  46.3   Platelets 150 - 400 K/uL 149  173  168      Most recent CMP    Latest Ref Rng & Units 12/17/2021    6:55 AM 12/10/2021    9:33 AM 11/17/2019    4:04 PM  CMP  Glucose 70 - 99 mg/dL 149  169  118   BUN 8 - 23 mg/dL '19  14  18   '$ Creatinine 0.61 - 1.24 mg/dL 0.91  0.82  0.94   Sodium 135 - 145 mmol/L 135  137  140   Potassium 3.5 - 5.1 mmol/L 4.3  4.3  4.3   Chloride 98 - 111 mmol/L 98  106  104   CO2 22 - 32 mmol/L '25  26  28   '$ Calcium 8.9 - 10.3 mg/dL 9.1  9.5  9.7     Renal function CrCl cannot be calculated (Patient's most recent lab result is older than the maximum 21 days allowed.).  Hgb A1c MFr Bld (%)  Date Value  12/10/2021 6.5 (H)   Left lower extremity venous reflux study:  - No evidence of deep vein thrombosis seen in the left lower extremity,  from the common femoral through the popliteal veins.  - No evidence of  superficial venous thrombosis in the left lower  extremity.     - Venous reflux is noted in the left common femoral vein.  - Venous reflux is noted in the left femoral vein.  - Venous reflux is noted in the left popliteal vein.      Yevonne Aline. Stanford Breed, MD [facility] Phone Number: [phone] 02/09/2022 1:06 PM  Total time spent on preparing this encounter including chart review, data review, collecting history, examining the patient, coordinating care for this new patient, 45 minutes.  Portions of this report may have been transcribed using voice recognition software.  Every effort has been made to ensure accuracy; however, inadvertent computerized transcription errors may still be present.

## 2022-02-09 ENCOUNTER — Encounter: Payer: Self-pay | Admitting: Vascular Surgery

## 2022-02-09 ENCOUNTER — Ambulatory Visit (INDEPENDENT_AMBULATORY_CARE_PROVIDER_SITE_OTHER): Payer: Medicare Other | Admitting: Vascular Surgery

## 2022-02-09 VITALS — BP 108/65 | HR 69 | Resp 20 | Ht 78.0 in | Wt 278.0 lb

## 2022-02-09 DIAGNOSIS — I872 Venous insufficiency (chronic) (peripheral): Secondary | ICD-10-CM

## 2022-02-20 DIAGNOSIS — Z6832 Body mass index (BMI) 32.0-32.9, adult: Secondary | ICD-10-CM | POA: Diagnosis not present

## 2022-02-20 DIAGNOSIS — M5136 Other intervertebral disc degeneration, lumbar region: Secondary | ICD-10-CM | POA: Diagnosis not present

## 2022-03-03 IMAGING — DX DG KNEE COMPLETE 4+V*R*
4 series · 4 of 4 positions shown · non-contrast
Comparison: None.

CLINICAL DATA: Acute pain of right knee

Recent fall
EXAM:
RIGHT KNEE - COMPLETE 4+ VIEW

[dg knee complete 4 views right (1 of 4)]
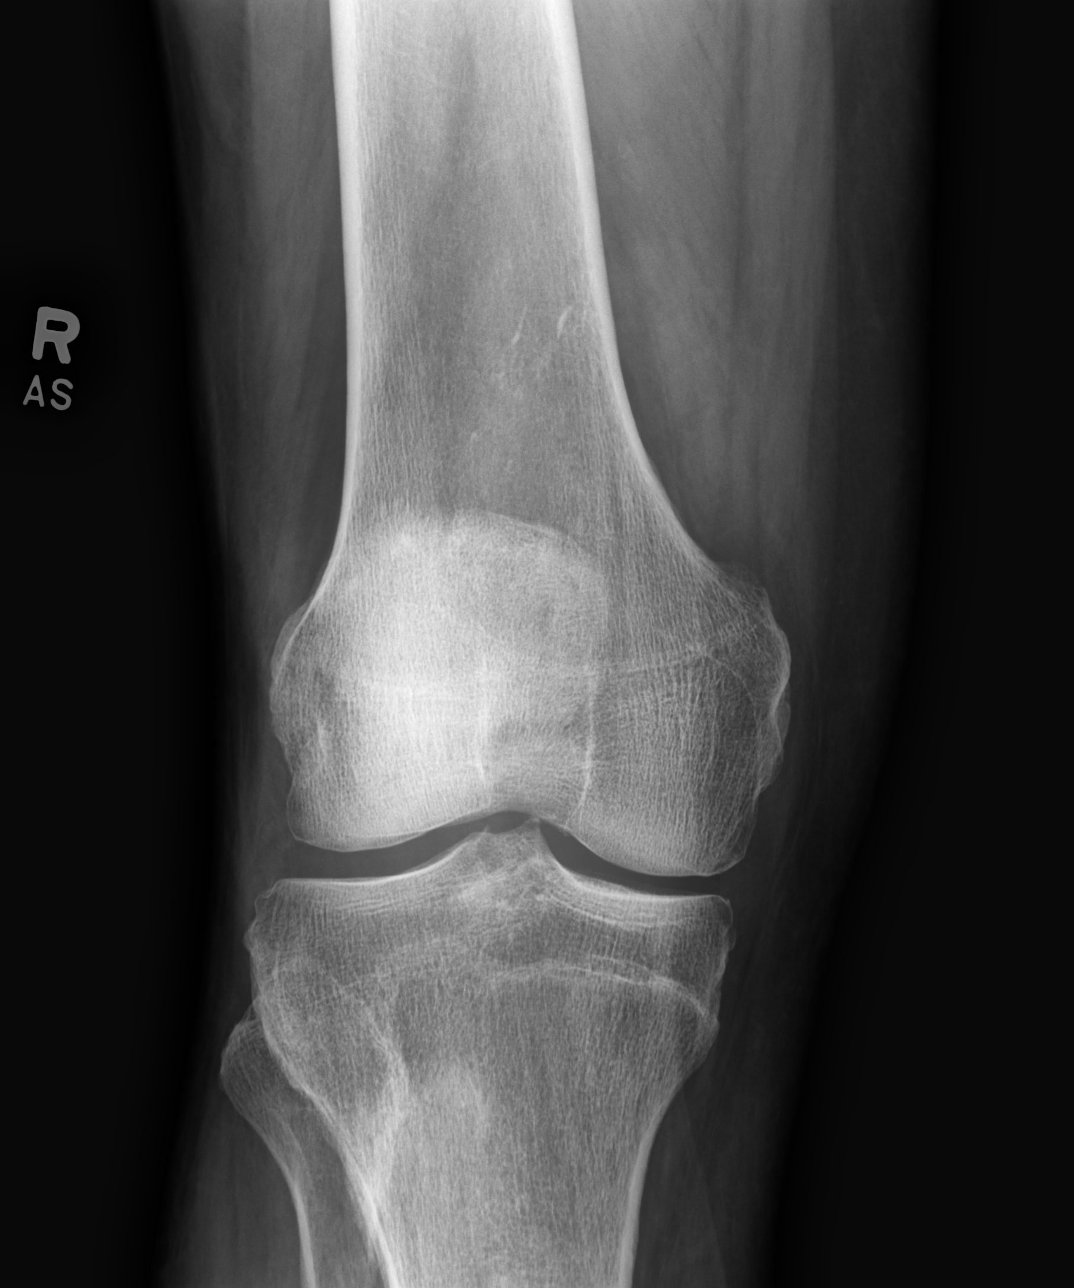

[dg knee complete 4 views right (2 of 4)]
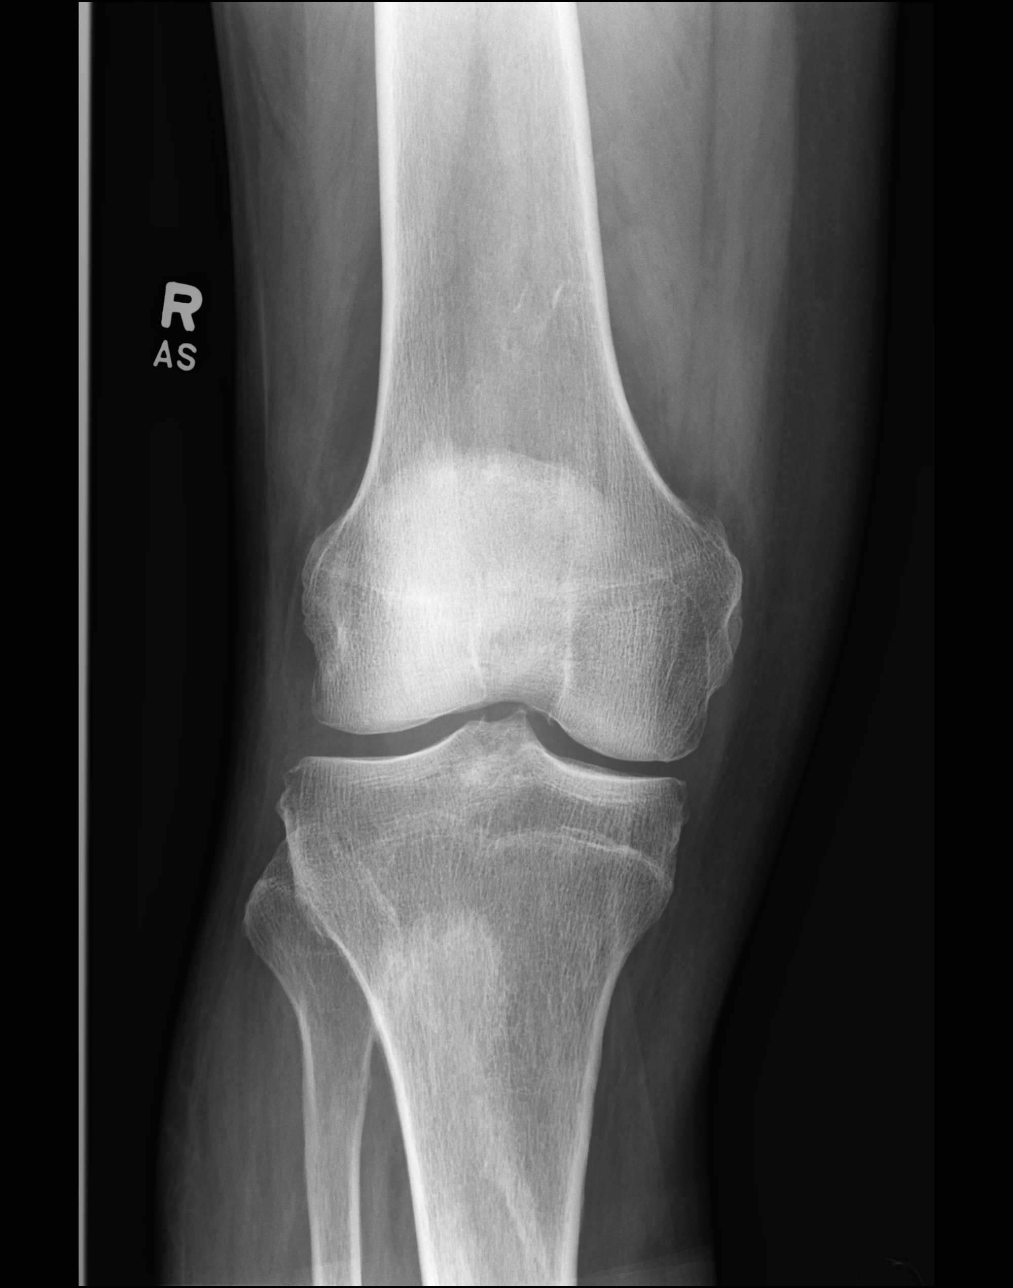

[dg knee complete 4 views right (3 of 4)]
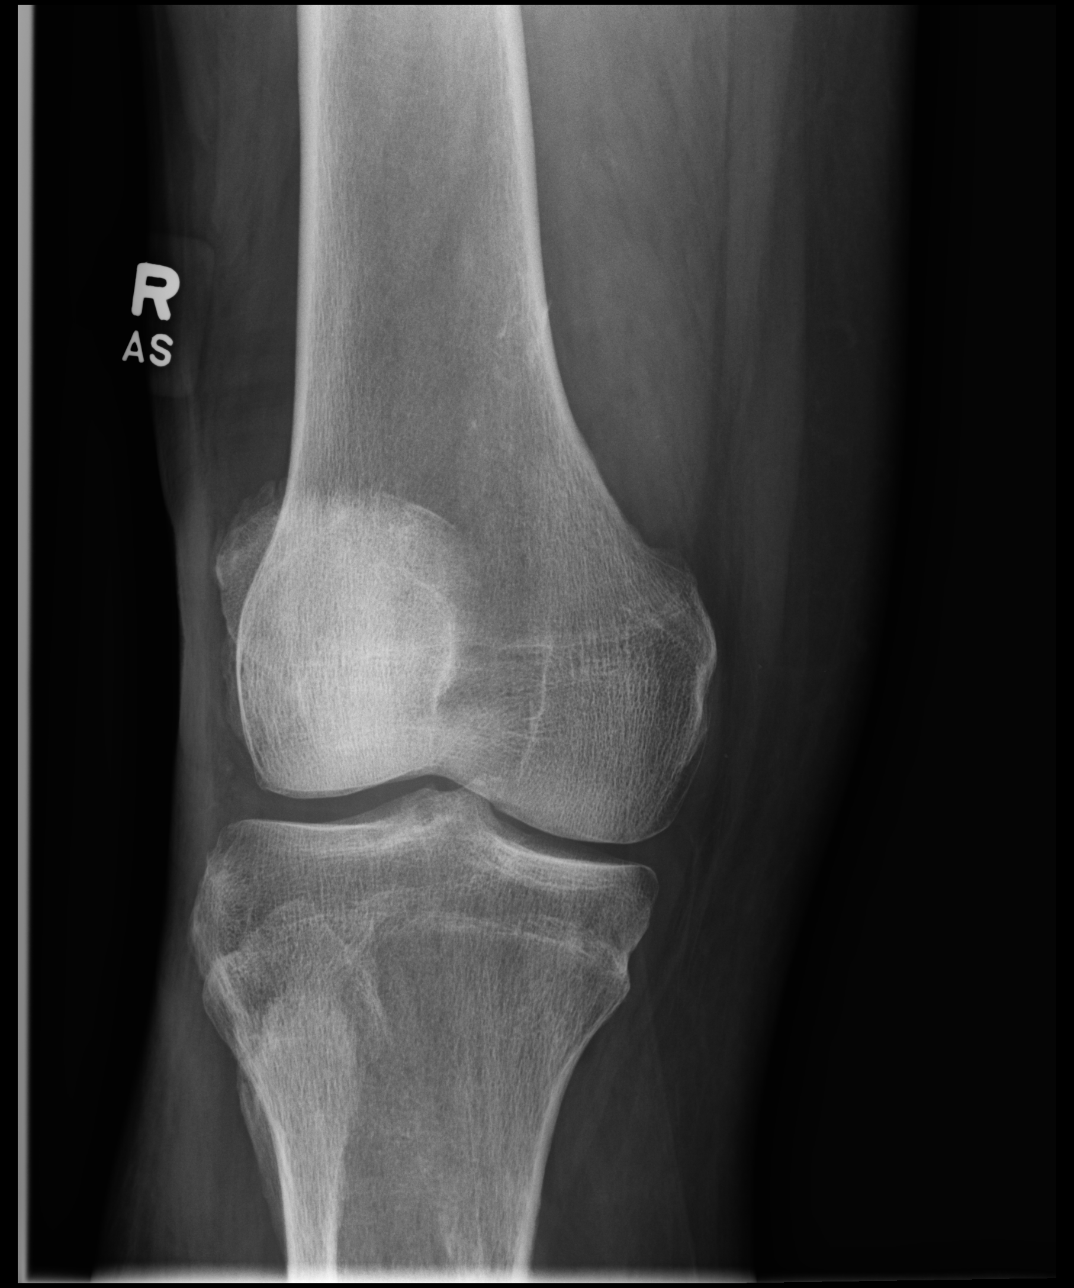

[dg knee complete 4 views right (4 of 4)]
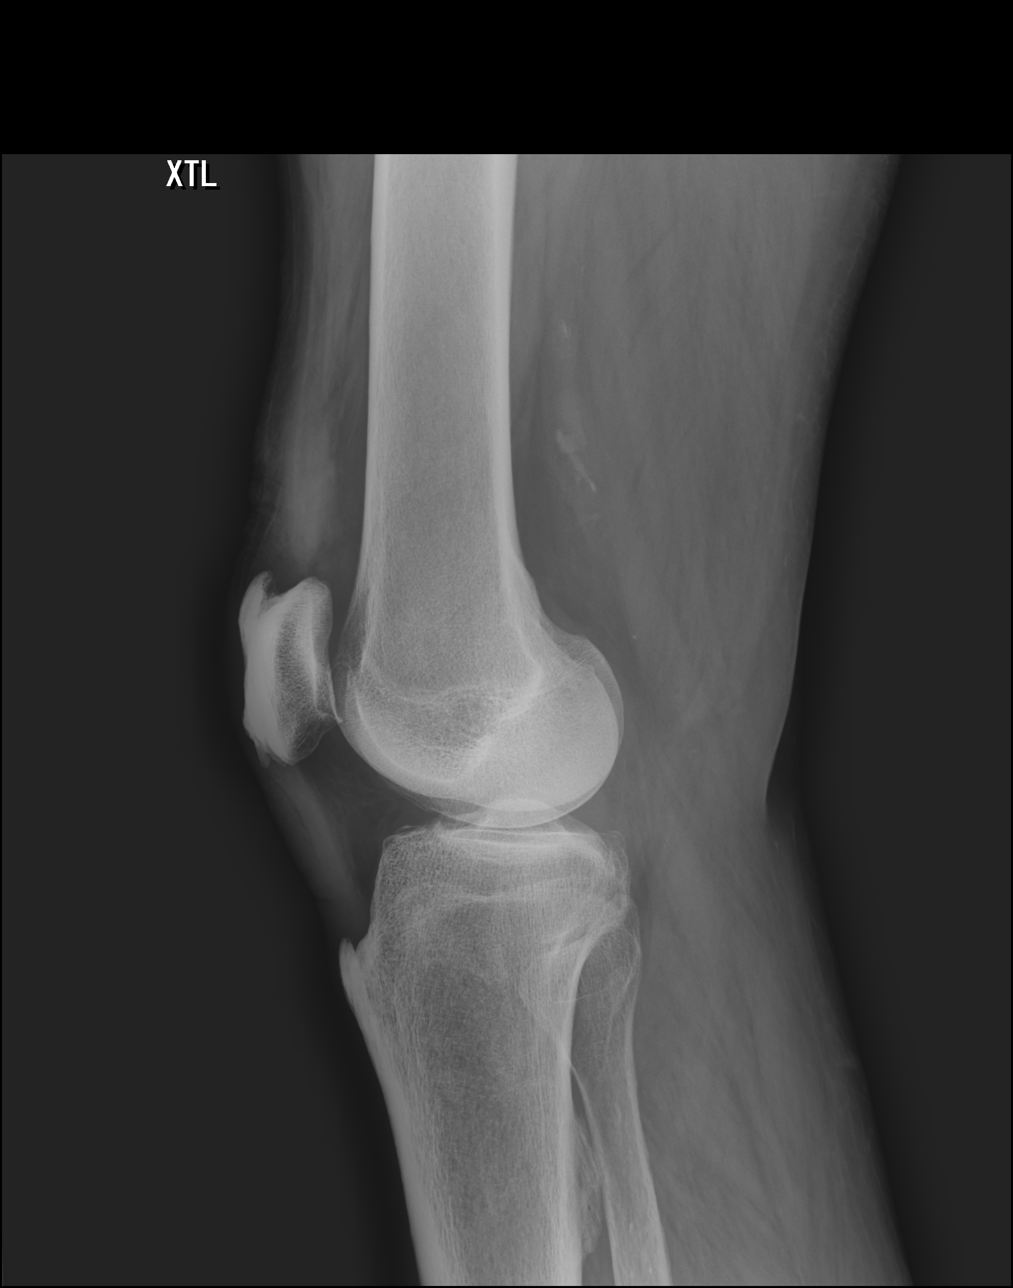

[4 of 4 positions shown; findings below may reference images not displayed]

FINDINGS: No fracture or dislocation. Mild thickening of the distal quadriceps
tendon. Mild thickening of the soft tissues, anterior to the
patellar tendon. Atherosclerotic changes seen throughout visualized
arterial segments.
IMPRESSION: 1. No acute fracture or dislocation.
2. Mild thickening of the distal quadriceps tendon and anterior to
the patellar tendon likely due to inflammation.

## 2022-03-03 IMAGING — DX DG LUMBAR SPINE 2-3V
3 series · 3 of 3 positions shown · non-contrast
Comparison: 11/12/2015

CLINICAL DATA: Right hip pain

EXAM:
LUMBAR SPINE - 2-3 VIEW

[dg lumbar spine 2-3 views (1 of 3)]
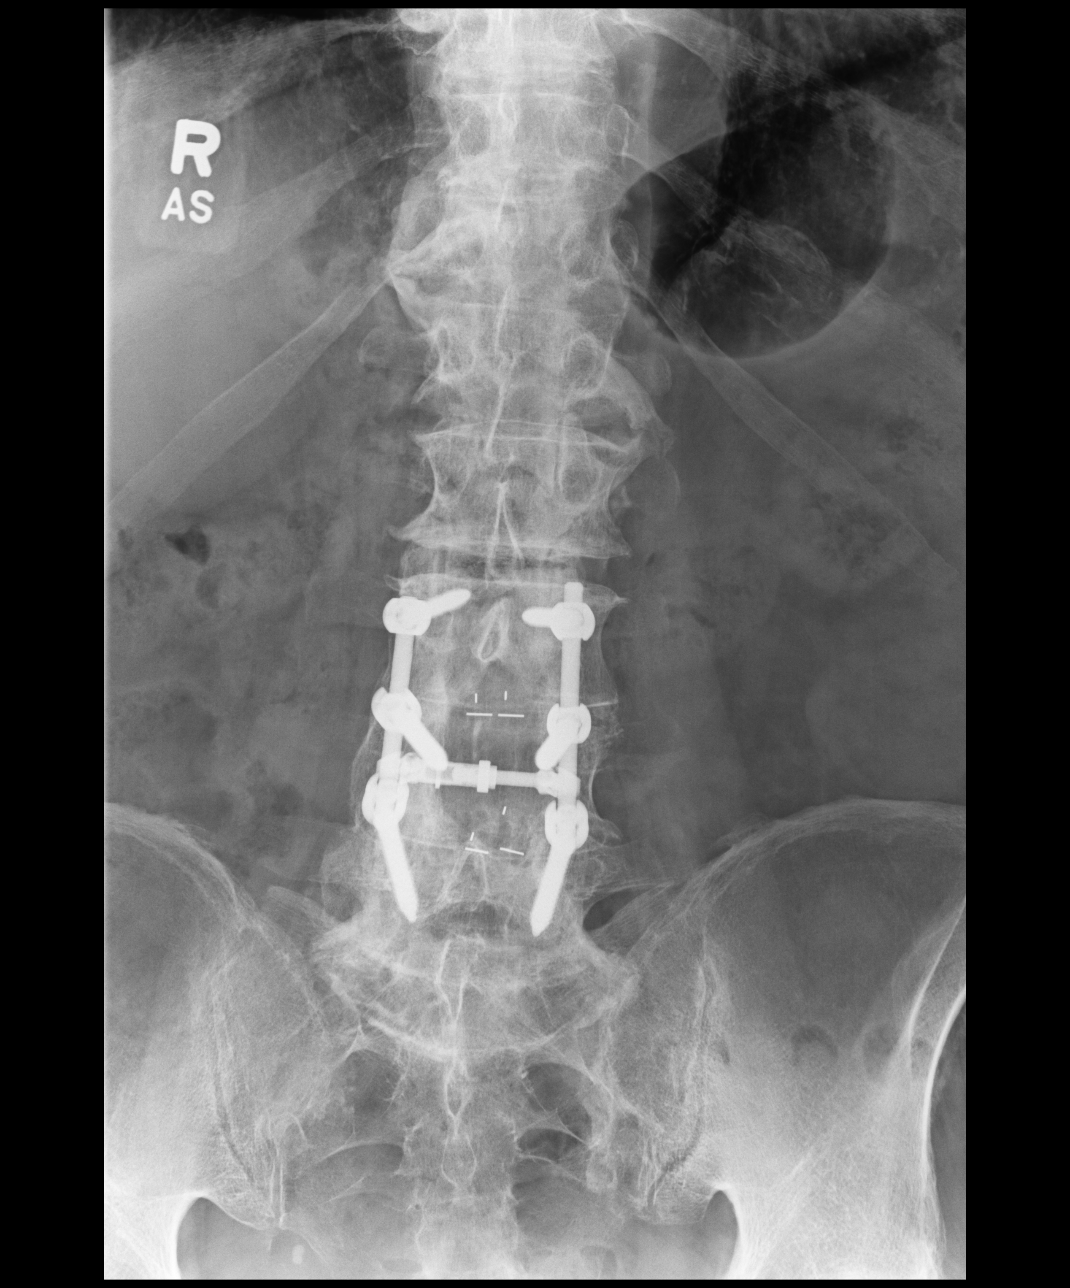

[dg lumbar spine 2-3 views (2 of 3)]
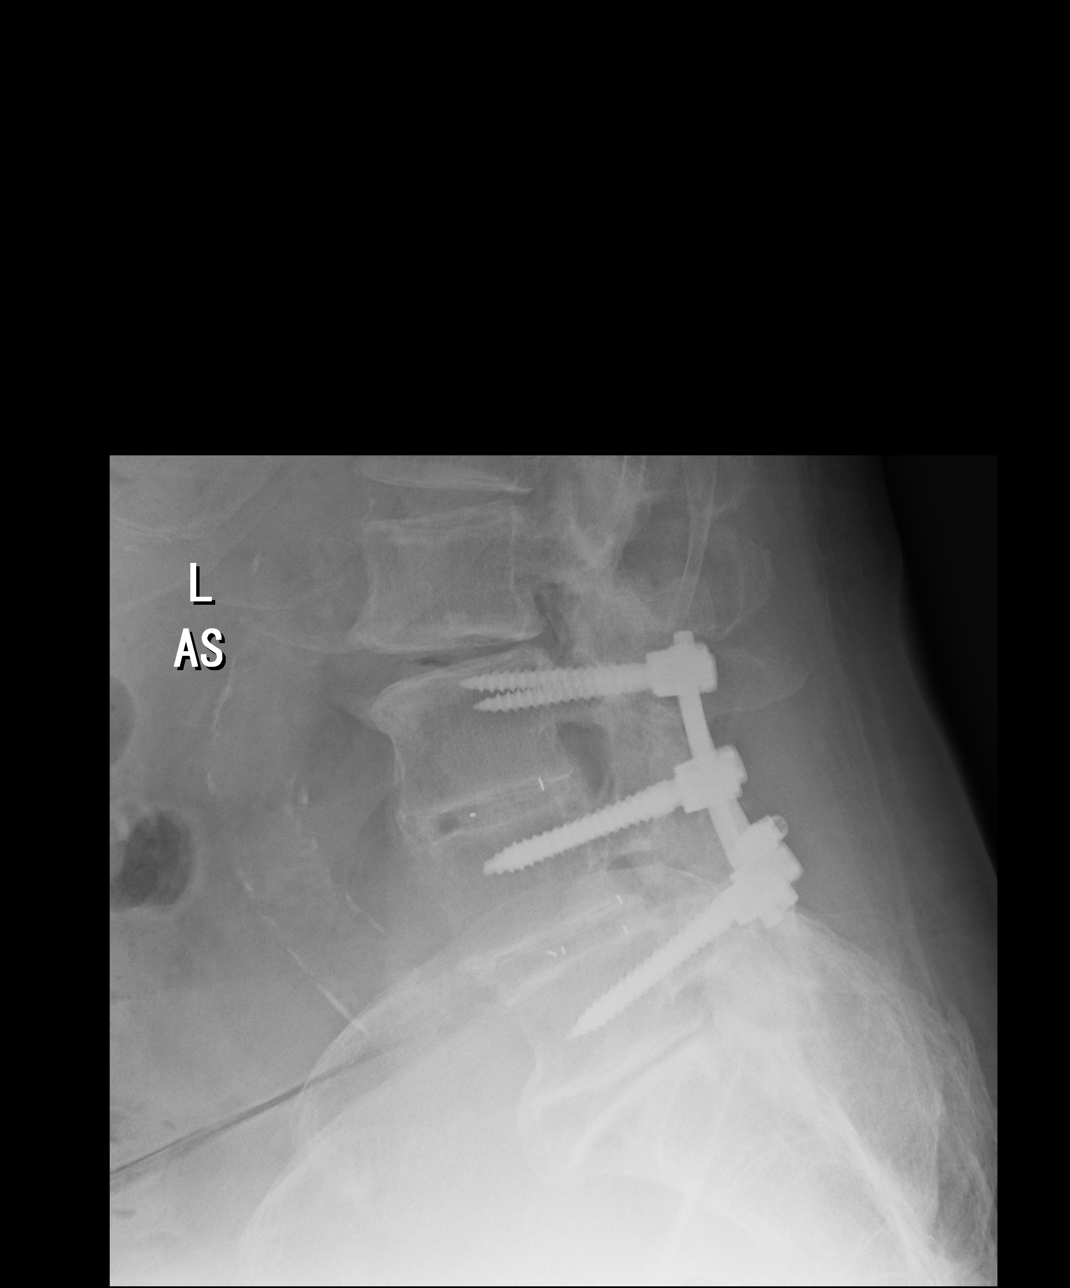

[dg lumbar spine 2-3 views (3 of 3)]
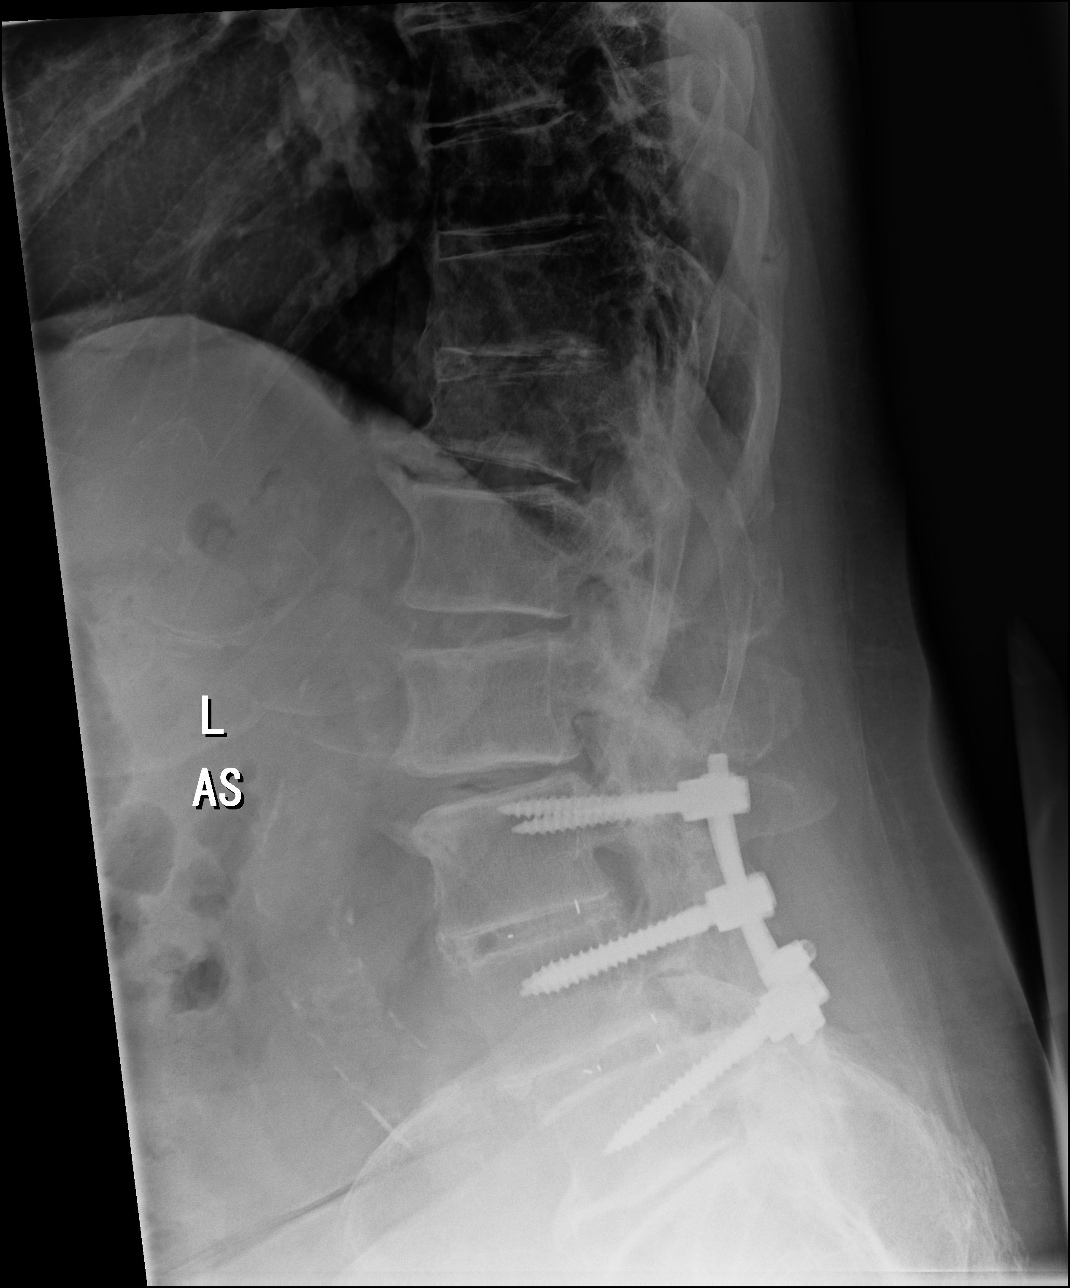

[3 of 3 positions shown; findings below may reference images not displayed]

FINDINGS: Mild compression deformity of the T12 vertebral body is unchanged
from prior examination. Vertebral body heights otherwise maintained.
Grade 1 anterolisthesis L4 on L5. Laminectomy and fusion changes are
seen at L3-L5. The screws and vertical stabilization rods are intact
of periprosthetic fracture or lucency.

Atherosclerotic changes seen throughout visualized arterial
segments.
IMPRESSION: No acute abnormality of the lumbar spine.

## 2022-03-04 DIAGNOSIS — L570 Actinic keratosis: Secondary | ICD-10-CM | POA: Diagnosis not present

## 2022-03-04 DIAGNOSIS — D1801 Hemangioma of skin and subcutaneous tissue: Secondary | ICD-10-CM | POA: Diagnosis not present

## 2022-03-04 DIAGNOSIS — L821 Other seborrheic keratosis: Secondary | ICD-10-CM | POA: Diagnosis not present

## 2022-03-04 DIAGNOSIS — L814 Other melanin hyperpigmentation: Secondary | ICD-10-CM | POA: Diagnosis not present

## 2022-03-04 DIAGNOSIS — D485 Neoplasm of uncertain behavior of skin: Secondary | ICD-10-CM | POA: Diagnosis not present

## 2022-03-04 DIAGNOSIS — Z85820 Personal history of malignant melanoma of skin: Secondary | ICD-10-CM | POA: Diagnosis not present

## 2022-03-04 DIAGNOSIS — D692 Other nonthrombocytopenic purpura: Secondary | ICD-10-CM | POA: Diagnosis not present

## 2022-03-06 ENCOUNTER — Other Ambulatory Visit: Payer: Self-pay | Admitting: Internal Medicine

## 2022-04-10 DIAGNOSIS — Z23 Encounter for immunization: Secondary | ICD-10-CM | POA: Diagnosis not present

## 2022-04-14 ENCOUNTER — Ambulatory Visit (INDEPENDENT_AMBULATORY_CARE_PROVIDER_SITE_OTHER): Payer: Medicare Other | Admitting: Podiatry

## 2022-04-14 DIAGNOSIS — M722 Plantar fascial fibromatosis: Secondary | ICD-10-CM

## 2022-04-14 NOTE — Progress Notes (Signed)
Subjective:  Patient ID: Matthew Robinson, male    DOB: 09-21-1945,  MRN: [id number]  Chief Complaint  Patient presents with   Peripheral Neuropathy    76 y.o. male presents with the above complaint.  Patient presents with right plantar midfoot plantar fasciitis.  Tender to touch is progressive gotten worse he has a high arch foot structure.  Pain is in the foot when sitting down and getting up.  Pain scale 7 out of 10 hurts with ambulation worse with pressure he would like to discuss treatment options for it.  He has not seen anyone as prior to seeing me for this.  Denies any other acute complaints.   Review of Systems: Negative except as noted in the HPI. Denies N/V/F/Ch.  Past Medical History:  Diagnosis Date   Chronic right-sided CHF (congestive heart failure) ([place])    Echo 11/17: EF 55 and 60, normal wall motion, trivial AI, moderate RVE, mildly reduced RVSF, moderate to severe RAE // Echo 6/18: EF 60-65, Gr 1 DD, trivial AI, mild RVE, mild RAE.   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 MOHS x 3.    Mild aortic insufficiency    Mitral valve prolapse    hx of - seen years ago by Dr Enid Derry in [facility], [place] - not seen on recent echoes   Prostate cancer [facility])    Sleep apnea    cpap 16    Current Outpatient Medications:    amoxicillin (AMOXIL) 500 MG capsule, Take 2,000 mg by mouth once., Disp: , Rfl:    Cholecalciferol (VITAMIN D) 2000 UNITS tablet, Take 2,000 Units by mouth 3 (three) times a week., Disp: , Rfl:    diphenoxylate-atropine (LOMOTIL) 2.5-0.025 MG tablet, Take 1-2 tablets by mouth 4 (four) times daily as needed for diarrhea or loose stools., Disp: 40 tablet, Rfl: 5   famotidine (PEPCID) 20 MG  tablet, Take 20 mg by mouth daily as needed for heartburn. , Disp: , Rfl:    ibuprofen (ADVIL) 200 MG tablet, Take 600 mg by mouth in the morning., Disp: , Rfl:    Leuprolide Acetate (ELIGARD [place]), Inject 1 application. into the skin every 6 (six) months., Disp: , Rfl:    levothyroxine (SYNTHROID, LEVOTHROID) 50 MCG tablet, Take 50 mcg by mouth every other day. Alternates with 75 mcg strength at 0400, Disp: , Rfl:    levothyroxine (SYNTHROID, LEVOTHROID) 75 MCG tablet, Take 75 mcg by mouth every other day. Alternates with 50 mcg strength at 0400 or 0500, Disp: , Rfl:    metFORMIN (GLUCOPHAGE) 500 MG tablet, Take 500 mg by mouth at bedtime. , Disp: , Rfl: 2   methocarbamol (ROBAXIN) 500 MG tablet, Take 1 tablet (500 mg total) by mouth every 6 (six) hours as needed for muscle spasms., Disp: 30 tablet, Rfl: 2   metoprolol succinate (TOPROL-XL) 25 MG 24 hr tablet, TAKE 1 TABLET BY MOUTH ONCE DAILY WITH MEAL OR IMMEDIATELY FOLLOWING A MEAL, Disp: 90 tablet, Rfl: 1   Multiple Vitamins-Minerals (PRESERVISION AREDS 2 PO), Take 1 tablet by mouth at bedtime., Disp: , Rfl:    mupirocin ointment (BACTROBAN) 2 %, Apply 1 application topically 2 (two) times daily. (Patient taking differently:  Apply 1 application  topically daily as needed (Skin infection).), Disp: 30 g, Rfl: 2   predniSONE (DELTASONE) 10 MG tablet, Take 10 mg by mouth daily as needed (When only on vacation at 0400)., Disp: , Rfl:    simvastatin (ZOCOR) 20 MG tablet, Take 20 mg by mouth 3 (three) times a week. At bedtime, Disp: , Rfl:    torsemide (DEMADEX) 20 MG tablet, Take 20 mg by mouth daily as needed., Disp: , Rfl:    TURMERIC PO, Take 1 tablet by mouth 3 (three) times a week. Morning, Disp: , Rfl:   Social History   Tobacco Use  Smoking Status Never  Smokeless Tobacco Never    No Known Allergies Objective:  There were no vitals filed for this visit. There is no height or weight on file to calculate BMI. Constitutional Well  developed. Well nourished.  Vascular Dorsalis pedis pulses palpable bilaterally. Posterior tibial pulses palpable bilaterally. Capillary refill normal to all digits.  No cyanosis or clubbing noted. Pedal hair growth normal.  Neurologic Normal speech. Oriented to person, place, and time. Epicritic sensation to light touch grossly present bilaterally.  Dermatologic Nails well groomed and normal in appearance. No open wounds. No skin lesions.  Orthopedic: Normal joint ROM without pain or crepitus bilaterally. No visible deformities. Tender to palpation at the calcaneal tuber right. No pain with calcaneal squeeze right. Ankle ROM diminished range of motion right. Silfverskiold Test: positive right.   Radiographs: None  Assessment:   1. Plantar fasciitis of right foot    Plan:  Patient was evaluated and treated and all questions answered.  Plantar Fasciitis, right midfoot - XR reviewed as above.  - Educated on icing and stretching. Instructions given.  - Injection delivered to the plantar fascia as below. - DME: Plantar fascial brace dispensed to support the medial longitudinal arch of the foot and offload pressure from the heel and prevent arch collapse during weightbearing - Pharmacologic management: None  Procedure: Injection Tendon/Ligament Location: Right plantar fascia at the glabrous junction; medial approach. Skin Prep: alcohol Injectate: 0.5 cc 0.5% marcaine plain, 0.5 cc of 1% Lidocaine, 0.5 cc kenalog 10. Disposition: Patient tolerated procedure well. Injection site dressed with a band-aid.  No follow-ups on file.

## 2022-04-17 ENCOUNTER — Telehealth: Payer: Self-pay | Admitting: *Deleted

## 2022-04-17 NOTE — Telephone Encounter (Signed)
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

## 2022-04-21 MED ORDER — METHYLPREDNISOLONE 4 MG PO TBPK: ORAL_TABLET | ORAL | 0 refills | Status: AC

## 2022-04-21 NOTE — Telephone Encounter (Signed)
Patient has been called to notify that medication was sent to pharmacy on file.

## 2022-04-27 DIAGNOSIS — H59812 Chorioretinal scars after surgery for detachment, left eye: Secondary | ICD-10-CM | POA: Diagnosis not present

## 2022-04-27 DIAGNOSIS — H353131 Nonexudative age-related macular degeneration, bilateral, early dry stage: Secondary | ICD-10-CM | POA: Diagnosis not present

## 2022-04-27 DIAGNOSIS — E119 Type 2 diabetes mellitus without complications: Secondary | ICD-10-CM | POA: Diagnosis not present

## 2022-04-27 DIAGNOSIS — D3131 Benign neoplasm of right choroid: Secondary | ICD-10-CM | POA: Diagnosis not present

## 2022-04-28 DIAGNOSIS — C439 Malignant melanoma of skin, unspecified: Secondary | ICD-10-CM | POA: Diagnosis not present

## 2022-04-28 DIAGNOSIS — D696 Thrombocytopenia, unspecified: Secondary | ICD-10-CM | POA: Diagnosis not present

## 2022-04-28 DIAGNOSIS — I509 Heart failure, unspecified: Secondary | ICD-10-CM | POA: Diagnosis not present

## 2022-04-28 DIAGNOSIS — M519 Unspecified thoracic, thoracolumbar and lumbosacral intervertebral disc disorder: Secondary | ICD-10-CM | POA: Diagnosis not present

## 2022-04-28 DIAGNOSIS — I4892 Unspecified atrial flutter: Secondary | ICD-10-CM | POA: Diagnosis not present

## 2022-04-28 DIAGNOSIS — Z23 Encounter for immunization: Secondary | ICD-10-CM | POA: Diagnosis not present

## 2022-04-28 DIAGNOSIS — E1140 Type 2 diabetes mellitus with diabetic neuropathy, unspecified: Secondary | ICD-10-CM | POA: Diagnosis not present

## 2022-04-28 DIAGNOSIS — G4733 Obstructive sleep apnea (adult) (pediatric): Secondary | ICD-10-CM | POA: Diagnosis not present

## 2022-04-28 DIAGNOSIS — E7800 Pure hypercholesterolemia, unspecified: Secondary | ICD-10-CM | POA: Diagnosis not present

## 2022-04-28 DIAGNOSIS — I872 Venous insufficiency (chronic) (peripheral): Secondary | ICD-10-CM | POA: Diagnosis not present

## 2022-04-28 DIAGNOSIS — E039 Hypothyroidism, unspecified: Secondary | ICD-10-CM | POA: Diagnosis not present

## 2022-04-28 DIAGNOSIS — Z8546 Personal history of malignant neoplasm of prostate: Secondary | ICD-10-CM | POA: Diagnosis not present

## 2022-04-28 DIAGNOSIS — Z1331 Encounter for screening for depression: Secondary | ICD-10-CM | POA: Diagnosis not present

## 2022-04-28 DIAGNOSIS — Z0000 Encounter for general adult medical examination without abnormal findings: Secondary | ICD-10-CM | POA: Diagnosis not present

## 2022-06-09 DIAGNOSIS — C61 Malignant neoplasm of prostate: Secondary | ICD-10-CM | POA: Diagnosis not present

## 2022-06-15 DIAGNOSIS — C61 Malignant neoplasm of prostate: Secondary | ICD-10-CM | POA: Diagnosis not present

## 2022-07-03 DIAGNOSIS — E782 Mixed hyperlipidemia: Secondary | ICD-10-CM | POA: Diagnosis not present

## 2022-07-10 DIAGNOSIS — M25561 Pain in right knee: Secondary | ICD-10-CM | POA: Diagnosis not present

## 2022-07-10 DIAGNOSIS — M1711 Unilateral primary osteoarthritis, right knee: Secondary | ICD-10-CM | POA: Diagnosis not present

## 2022-07-18 DIAGNOSIS — M25561 Pain in right knee: Secondary | ICD-10-CM | POA: Diagnosis not present

## 2022-07-22 DIAGNOSIS — Z6831 Body mass index (BMI) 31.0-31.9, adult: Secondary | ICD-10-CM | POA: Diagnosis not present

## 2022-07-22 DIAGNOSIS — M4726 Other spondylosis with radiculopathy, lumbar region: Secondary | ICD-10-CM | POA: Diagnosis not present

## 2022-07-23 DIAGNOSIS — M25561 Pain in right knee: Secondary | ICD-10-CM | POA: Diagnosis not present

## 2022-08-01 ENCOUNTER — Other Ambulatory Visit: Payer: Self-pay | Admitting: Internal Medicine

## 2022-08-24 NOTE — Progress Notes (Deleted)
Cardiology Office Note Date:  08/24/2022  Patient ID:  Matthew Robinson, Matthew Robinson 16-Jun-1946, MRN [id number] PCP:  Wenda Low, MD  Cardiologist:  Dr. Harrington Challenger [phone]) Electrophysiologist: Dr. Lovena Le  ***refresh   Chief Complaint: *** annual visit  History of Present Illness: Matthew Robinson is a 77 y.o. male with history of AFlutter (ablated), prostate Ca (s/p XRT, hormonal tx), hypothyroidism, DM, HLD, HTN.  He saw Dr. Lovena Le 07/22/21, doing well, no symptoms of AFlutter/arrhythmia, BP controlled, no changes were made.  *** symptoms, flutter/otherwise *** heart failure???  RV down by hx, RHC ok *** labs, lipids *** symptoms, gen cards?   Past Medical History:  Diagnosis Date   Chronic right-sided CHF (congestive heart failure) ([place])    Echo 11/17: EF 55 and 60, normal wall motion, trivial AI, moderate RVE, mildly reduced RVSF, moderate to severe RAE // Echo 6/18: EF 60-65, Gr 1 DD, trivial AI, mild RVE, mild RAE.   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 MOHS x 3.    Mild aortic insufficiency    Mitral valve prolapse    hx of - seen years ago by Dr Enid Derry in [facility], [place] - not seen on recent echoes   Prostate cancer [facility])    Sleep apnea    cpap 16    Past Surgical History:  Procedure Laterality Date   A-FLUTTER ABLATION N/A 12/14/2019   Procedure: A-FLUTTER ABLATION;  Surgeon: Evans Lance, MD;  Location: [place] [facility];  Service: Cardiovascular;  Laterality: N/A;   CATARACT EXTRACTION Left    COLONOSCOPY     polyps in past   COLONOSCOPY WITH PROPOFOL N/A 08/14/2014   Procedure: COLONOSCOPY WITH PROPOFOL;  Surgeon: Garlan Fair, MD;  Location: [facility];  Service: Endoscopy;  Laterality: N/A;    MELANOMA EXCISION     MENISCUS REPAIR Left 1970   RETINAL DETACHMENT SURGERY     RIGHT HEART CATH N/A 05/26/2018   Procedure: RIGHT HEART CATH;  Surgeon: Larey Dresser, MD;  Location: [place] [facility];  Service: Cardiovascular;  Laterality: N/A;   SPINE SURGERY     4'14 lumbar 3,4,5 fusion(Peck-Nudelman)   TONSILLECTOMY      Current Outpatient Medications  Medication Sig Dispense Refill   amoxicillin (AMOXIL) 500 MG capsule Take 2,000 mg by mouth once.     Cholecalciferol (VITAMIN D) 2000 UNITS tablet Take 2,000 Units by mouth 3 (three) times a week.     diphenoxylate-atropine (LOMOTIL) 2.5-0.025 MG tablet Take 1-2 tablets by mouth 4 (four) times daily as needed for diarrhea or loose stools. 40 tablet 5   famotidine (PEPCID) 20 MG tablet Take 20 mg by mouth daily as needed for heartburn.      ibuprofen (ADVIL) 200 MG tablet Take 600 mg by mouth in the morning.     Leuprolide Acetate (ELIGARD [place]) Inject 1 application. into the skin every 6 (six) months.     levothyroxine (SYNTHROID, LEVOTHROID) 50 MCG tablet Take 50 mcg by mouth every other day. Alternates with 75 mcg strength at 0400     levothyroxine (SYNTHROID, LEVOTHROID) 75 MCG tablet Take 75 mcg by mouth every other day. Alternates with 50  mcg strength at 0400 or 0500     metFORMIN (GLUCOPHAGE) 500 MG tablet Take 500 mg by mouth at bedtime.   2   methocarbamol (ROBAXIN) 500 MG tablet Take 1 tablet (500 mg total) by mouth every 6 (six) hours as needed for muscle spasms. 30 tablet 2   methylPREDNISolone (MEDROL DOSEPAK) 4 MG TBPK tablet Take as directed 21 each 0   metoprolol succinate (TOPROL-XL) 25 MG 24 hr tablet TAKE 1 TABLET BY MOUTH ONCE DAILY WITH MEAL OR IMMEDIATELY FOLLOWING A MEAL 30 tablet 0   Multiple Vitamins-Minerals (PRESERVISION AREDS 2 PO) Take 1 tablet by mouth at bedtime.     mupirocin ointment (BACTROBAN) 2 % Apply 1 application topically 2 (two) times daily. (Patient taking differently: Apply 1  application  topically daily as needed (Skin infection).) 30 g 2   predniSONE (DELTASONE) 10 MG tablet Take 10 mg by mouth daily as needed (When only on vacation at 0400).     simvastatin (ZOCOR) 20 MG tablet Take 20 mg by mouth 3 (three) times a week. At bedtime     torsemide (DEMADEX) 20 MG tablet Take 20 mg by mouth daily as needed.     TURMERIC PO Take 1 tablet by mouth 3 (three) times a week. Morning     No current facility-administered medications for this visit.    Allergies:   Patient has no known allergies.   Social History:  The patient  reports that he has never smoked. He has never used smokeless tobacco. He reports current alcohol use of about 1.0 - 3.0 standard drink of alcohol per week. He reports that he does not use drugs.   Family History:  The patient's family history includes Atrial fibrillation in his mother; Breast cancer in his mother; Cancer in his father and maternal grandfather; Heart attack in his father; Heart disease in his mother; Heart failure in his mother; Hypertension in his father; Lymphoma in his father; Mitral valve prolapse in his mother.  ROS:  Please see the history of present illness.    All other systems are reviewed and otherwise negative.   PHYSICAL EXAM:  VS:  There were no vitals taken for this visit. BMI: There is no height or weight on file to calculate BMI. Well nourished, well developed, in no acute distress HEENT: normocephalic, atraumatic Neck: no JVD, carotid bruits or masses Cardiac:  *** RRR; no significant murmurs, no rubs, or gallops Lungs:  *** CTA b/l, no wheezing, rhonchi or rales Abd: soft, nontender MS: no deformity or *** atrophy Ext: *** no edema Skin: warm and dry, no rash Neuro:  No gross deficits appreciated Psych: euthymic mood, full affect     EKG:  Done today and reviewed by myself shows  ***  06/11/2020: TTE 1. Since the last study on 12/14/2019 LVEF has normalized from 30-35% to  60-65%.   2. Left  ventricular ejection fraction, by estimation, is 60 to 65%. The  left ventricle has normal function. The left ventricle has no regional  wall motion abnormalities. There is mild concentric left ventricular  hypertrophy. Left ventricular diastolic  parameters are consistent with Grade I diastolic dysfunction (impaired  relaxation).   3. Right ventricular systolic function is normal. The right ventricular  size is mildly enlarged. There is normal pulmonary artery systolic  pressure. The estimated right ventricular systolic pressure is Q000111Q mmHg.   4. Left atrial size was mildly dilated.   5. Right atrial size was moderately dilated.   6.  The mitral valve is normal in structure. No evidence of mitral valve  regurgitation. No evidence of mitral stenosis.   7. The aortic valve is normal in structure. Aortic valve regurgitation is  mild. Mild to moderate aortic valve sclerosis/calcification is present,  without any evidence of aortic stenosis.   8. The inferior vena cava is normal in size with greater than 50%  respiratory variability, suggesting right atrial pressure of 3 mmHg.   12/14/2019: EPS/ablation CONCLUSIONS:  1. Isthmus-dependent right atrial flutter upon presentation.  2. Successful 3D irrigated radiofrequency ablation of atrial flutter along the cavotricuspid isthmus with complete bidirectional isthmus block achieved.  3. No inducible arrhythmias following ablation.  4. No early apparent complications.    05/26/2018: RHC 1. Normal filling pressures.   2. Normal pulmonary artery pressure 3. High cardiac output.  4. No evidence for shunt lesion on shunt run.    Recent Labs: 12/17/2021: BUN 19; Creatinine, Ser 0.91; Hemoglobin 12.8; Platelets 149; Potassium 4.3; Sodium 135  No results found for requested labs within last 365 days.   CrCl cannot be calculated (Patient's most recent lab result is older than the maximum 21 days allowed.).   Wt Readings from Last 3 Encounters:   02/09/22 278 lb (126.1 kg)  12/16/21 275 lb (124.7 kg)  12/10/21 283 lb (128.4 kg)     Other studies reviewed: Additional studies/records reviewed today include: summarized above  ASSESSMENT AND PLAN:  AFlutter (ablated 2019) ***  HTN ***  Disposition: F/u with ***  Current medicines are reviewed at length with the patient today.  The patient did not have any concerns regarding medicines.  Venetia Night, PA-C 08/24/2022 10:05 AM     [facility] [place] [address] [place] [address] [phone] (office)  [phone] (fax)

## 2022-08-27 ENCOUNTER — Ambulatory Visit: Payer: Medicare Other | Admitting: Physician Assistant

## 2022-09-02 DIAGNOSIS — E1140 Type 2 diabetes mellitus with diabetic neuropathy, unspecified: Secondary | ICD-10-CM | POA: Diagnosis not present

## 2022-09-02 DIAGNOSIS — Z7189 Other specified counseling: Secondary | ICD-10-CM | POA: Diagnosis not present

## 2022-09-02 DIAGNOSIS — E669 Obesity, unspecified: Secondary | ICD-10-CM | POA: Diagnosis not present

## 2022-09-03 DIAGNOSIS — M25561 Pain in right knee: Secondary | ICD-10-CM | POA: Diagnosis not present

## 2022-09-06 NOTE — Progress Notes (Unsigned)
Cardiology Office Note Date:  09/06/2022  Patient ID:  Gopal, Matthew Robinson 02, 1947, MRN [id number] PCP:  Wenda Low, MD  Cardiologist:  Dr. Harrington Challenger [phone]) Electrophysiologist: Dr. Lovena Le  ***refresh   Chief Complaint: *** annual visit  History of Present Illness: Matthew Robinson is a 77 y.o. male with history of AFlutter (ablated), prostate Ca (s/p XRT, hormonal tx), hypothyroidism, DM, HLD, HTN.  He saw Dr. Lovena Le 07/22/21, doing well, no symptoms of AFlutter/arrhythmia, BP controlled, no changes were made.  *** symptoms, flutter/otherwise *** heart failure???  RV down by hx, RHC ok, last echo ok *** labs, lipids *** symptoms, gen cards?   Past Medical History:  Diagnosis Date   Chronic right-sided CHF (congestive heart failure) ([place])    Echo 11/17: EF 55 and 60, normal wall motion, trivial AI, moderate RVE, mildly reduced RVSF, moderate to severe RAE // Echo 6/18: EF 60-65, Gr 1 DD, trivial AI, mild RVE, mild RAE.   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 MOHS x 3.    Mild aortic insufficiency    Mitral valve prolapse    hx of - seen years ago by Dr Enid Derry in [facility], [place] - not seen on recent echoes   Prostate cancer [facility])    Sleep apnea    cpap 16    Past Surgical History:  Procedure Laterality Date   A-FLUTTER ABLATION N/A 12/14/2019   Procedure: A-FLUTTER ABLATION;  Surgeon: Evans Lance, MD;  Location: [place] [facility];  Service: Cardiovascular;  Laterality: N/A;   CATARACT EXTRACTION Left    COLONOSCOPY     polyps in past   COLONOSCOPY WITH PROPOFOL N/A 08/14/2014   Procedure: COLONOSCOPY WITH PROPOFOL;  Surgeon: Garlan Fair, MD;  [facility];  Service: Endoscopy;   Laterality: N/A;   MELANOMA EXCISION     MENISCUS REPAIR Left 1970   RETINAL DETACHMENT SURGERY     RIGHT HEART CATH N/A 05/26/2018   Procedure: RIGHT HEART CATH;  Surgeon: Larey Dresser, MD;  Location: [place] [facility];  Service: Cardiovascular;  Laterality: N/A;   SPINE SURGERY     4'14 lumbar 3,4,5 fusion([place]-Nudelman)   TONSILLECTOMY      Current Outpatient Medications  Medication Sig Dispense Refill   amoxicillin (AMOXIL) 500 MG capsule Take 2,000 mg by mouth once.     Cholecalciferol (VITAMIN D) 2000 UNITS tablet Take 2,000 Units by mouth 3 (three) times a week.     diphenoxylate-atropine (LOMOTIL) 2.5-0.025 MG tablet Take 1-2 tablets by mouth 4 (four) times daily as needed for diarrhea or loose stools. 40 tablet 5   famotidine (PEPCID) 20 MG tablet Take 20 mg by mouth daily as needed for heartburn.      ibuprofen (ADVIL) 200 MG tablet Take 600 mg by mouth in the morning.     Leuprolide Acetate (ELIGARD [place]) Inject 1 application. into the skin every 6 (six) months.     levothyroxine (SYNTHROID, LEVOTHROID) 50 MCG tablet Take 50 mcg by mouth every other day. Alternates with 75 mcg strength at 0400     levothyroxine (SYNTHROID, LEVOTHROID) 75 MCG tablet Take 75 mcg by mouth every other day.  Alternates with 50 mcg strength at 0400 or 0500     metFORMIN (GLUCOPHAGE) 500 MG tablet Take 500 mg by mouth at bedtime.   2   methocarbamol (ROBAXIN) 500 MG tablet Take 1 tablet (500 mg total) by mouth every 6 (six) hours as needed for muscle spasms. 30 tablet 2   methylPREDNISolone (MEDROL DOSEPAK) 4 MG TBPK tablet Take as directed 21 each 0   metoprolol succinate (TOPROL-XL) 25 MG 24 hr tablet TAKE 1 TABLET BY MOUTH ONCE DAILY WITH MEAL OR IMMEDIATELY FOLLOWING A MEAL 30 tablet 0   Multiple Vitamins-Minerals (PRESERVISION AREDS 2 PO) Take 1 tablet by mouth at bedtime.     mupirocin ointment (BACTROBAN) 2 % Apply 1 application topically 2 (two) times daily. (Patient taking  differently: Apply 1 application  topically daily as needed (Skin infection).) 30 g 2   predniSONE (DELTASONE) 10 MG tablet Take 10 mg by mouth daily as needed (When only on vacation at 0400).     simvastatin (ZOCOR) 20 MG tablet Take 20 mg by mouth 3 (three) times a week. At bedtime     torsemide (DEMADEX) 20 MG tablet Take 20 mg by mouth daily as needed.     TURMERIC PO Take 1 tablet by mouth 3 (three) times a week. Morning     No current facility-administered medications for this visit.    Allergies:   Patient has no known allergies.   Social History:  The patient  reports that he has never smoked. He has never used smokeless tobacco. He reports current alcohol use of about 1.0 - 3.0 standard drink of alcohol per week. He reports that he does not use drugs.   Family History:  The patient's family history includes Atrial fibrillation in his mother; Breast cancer in his mother; Cancer in his father and maternal grandfather; Heart attack in his father; Heart disease in his mother; Heart failure in his mother; Hypertension in his father; Lymphoma in his father; Mitral valve prolapse in his mother.  ROS:  Please see the history of present illness.    All other systems are reviewed and otherwise negative.   PHYSICAL EXAM:  VS:  There were no vitals taken for this visit. BMI: There is no height or weight on file to calculate BMI. Well nourished, well developed, in no acute distress HEENT: normocephalic, atraumatic Neck: no JVD, carotid bruits or masses Cardiac:  *** RRR; no significant murmurs, no rubs, or gallops Lungs:  *** CTA b/l, no wheezing, rhonchi or rales Abd: soft, nontender MS: no deformity or *** atrophy Ext: *** no edema Skin: warm and dry, no rash Neuro:  No gross deficits appreciated Psych: euthymic mood, full affect     EKG:  Done today and reviewed by myself shows  ***  06/11/2020: TTE 1. Since the last study on 12/14/2019 LVEF has normalized from 30-35% to  60-65%.    2. Left ventricular ejection fraction, by estimation, is 60 to 65%. The  left ventricle has normal function. The left ventricle has no regional  wall motion abnormalities. There is mild concentric left ventricular  hypertrophy. Left ventricular diastolic  parameters are consistent with Grade I diastolic dysfunction (impaired  relaxation).   3. Right ventricular systolic function is normal. The right ventricular  size is mildly enlarged. There is normal pulmonary artery systolic  pressure. The estimated right ventricular systolic pressure is Q000111Q mmHg.   4. Left atrial size was mildly dilated.   5. Right atrial size was moderately dilated.  6. The mitral valve is normal in structure. No evidence of mitral valve  regurgitation. No evidence of mitral stenosis.   7. The aortic valve is normal in structure. Aortic valve regurgitation is  mild. Mild to moderate aortic valve sclerosis/calcification is present,  without any evidence of aortic stenosis.   8. The inferior vena cava is normal in size with greater than 50%  respiratory variability, suggesting right atrial pressure of 3 mmHg.   12/14/2019: EPS/ablation CONCLUSIONS:  1. Isthmus-dependent right atrial flutter upon presentation.  2. Successful 3D irrigated radiofrequency ablation of atrial flutter along the cavotricuspid isthmus with complete bidirectional isthmus block achieved.  3. No inducible arrhythmias following ablation.  4. No early apparent complications.    05/26/2018: RHC 1. Normal filling pressures.   2. Normal pulmonary artery pressure 3. High cardiac output.  4. No evidence for shunt lesion on shunt run.    Recent Labs: 12/17/2021: BUN 19; Creatinine, Ser 0.91; Hemoglobin 12.8; Platelets 149; Potassium 4.3; Sodium 135  No results found for requested labs within last 365 days.   CrCl cannot be calculated (Patient's most recent lab result is older than the maximum 21 days allowed.).   Wt Readings from Last 3  Encounters:  02/09/22 278 lb (126.1 kg)  12/16/21 275 lb (124.7 kg)  12/10/21 283 lb (128.4 kg)     Other studies reviewed: Additional studies/records reviewed today include: summarized above  ASSESSMENT AND PLAN:  AFlutter (ablated 2019) ***  HTN ***  Disposition: F/u with ***  Current medicines are reviewed at length with the patient today.  The patient did not have any concerns regarding medicines.  Venetia Night, PA-C 09/06/2022 7:56 AM     [facility] [place]  [id number] [phone] (office)  [phone] (fax)

## 2022-09-07 ENCOUNTER — Encounter: Payer: Self-pay | Admitting: Physician Assistant

## 2022-09-07 ENCOUNTER — Ambulatory Visit: Payer: Medicare Other | Attending: Physician Assistant | Admitting: Physician Assistant

## 2022-09-07 VITALS — BP 104/70 | HR 76 | Ht 78.0 in | Wt 280.0 lb

## 2022-09-07 DIAGNOSIS — I10 Essential (primary) hypertension: Secondary | ICD-10-CM | POA: Insufficient documentation

## 2022-09-07 DIAGNOSIS — I4892 Unspecified atrial flutter: Secondary | ICD-10-CM | POA: Insufficient documentation

## 2022-09-07 DIAGNOSIS — I429 Cardiomyopathy, unspecified: Secondary | ICD-10-CM | POA: Diagnosis not present

## 2022-09-07 DIAGNOSIS — R609 Edema, unspecified: Secondary | ICD-10-CM | POA: Diagnosis not present

## 2022-09-07 MED ORDER — METOPROLOL SUCCINATE ER 25 MG PO TB24: ORAL_TABLET | ORAL | 3 refills | Status: AC

## 2022-09-07 NOTE — Patient Instructions (Signed)
Medication Instructions:   Your physician recommends that you continue on your current medications as directed. Please refer to the Current Medication list given to you today.   *If you need a refill on your cardiac medications before your next appointment, please call your pharmacy*   Lab Work: [place]    If you have labs (blood work) drawn today and your tests are completely normal, you will receive your results only by: [place] (if you have MyChart) OR A paper copy in the mail If you have any lab test that is abnormal or we need to change your treatment, we will call you to review the results.   Testing/Procedures: Your physician has requested that you have an echocardiogram. Echocardiography is a painless test that uses sound waves to create images of your heart. It provides your doctor with information about the size and shape of your heart and how well your heart's chambers and valves are working. This procedure takes approximately one hour. There are no restrictions for this procedure. Please do NOT wear cologne, perfume, aftershave, or lotions (deodorant is allowed). Please arrive 15 minutes prior to your appointment time.    Follow-Up: At [facility], you and your health needs are our priority.  As part of our continuing mission to provide you with exceptional heart care, we have created designated Provider Care Teams.  These Care Teams include your primary Cardiologist (physician) and Advanced Practice Providers (APPs -  Physician Assistants and Nurse Practitioners) who all work together to provide you with the care you need, when you need it.  We recommend signing up for the patient portal called "MyChart".  Sign up information is provided on this After Visit Summary.  MyChart is used to connect with patients for Virtual Visits (Telemedicine).  Patients are able to view lab/test results, encounter notes, upcoming appointments, etc.  Non-urgent  messages can be sent to your provider as well.   To learn more about what you can do with MyChart, go to [email or web address].    Your next appointment:   1 year(s)  Provider:   You may see Dr. Lovena Le  or one of the following Advanced Practice Providers on your designated Care Team:   Tommye Standard, [place]  Other Instructions

## 2022-09-09 DIAGNOSIS — L570 Actinic keratosis: Secondary | ICD-10-CM | POA: Diagnosis not present

## 2022-09-09 DIAGNOSIS — Z85820 Personal history of malignant melanoma of skin: Secondary | ICD-10-CM | POA: Diagnosis not present

## 2022-09-09 DIAGNOSIS — L814 Other melanin hyperpigmentation: Secondary | ICD-10-CM | POA: Diagnosis not present

## 2022-09-09 DIAGNOSIS — L308 Other specified dermatitis: Secondary | ICD-10-CM | POA: Diagnosis not present

## 2022-09-09 DIAGNOSIS — D485 Neoplasm of uncertain behavior of skin: Secondary | ICD-10-CM | POA: Diagnosis not present

## 2022-09-09 DIAGNOSIS — L821 Other seborrheic keratosis: Secondary | ICD-10-CM | POA: Diagnosis not present

## 2022-09-09 DIAGNOSIS — D1801 Hemangioma of skin and subcutaneous tissue: Secondary | ICD-10-CM | POA: Diagnosis not present

## 2022-10-14 ENCOUNTER — Ambulatory Visit (HOSPITAL_COMMUNITY): Payer: Medicare Other

## 2022-10-21 ENCOUNTER — Ambulatory Visit (HOSPITAL_COMMUNITY): Payer: Medicare Other | Attending: Physician Assistant

## 2022-10-21 DIAGNOSIS — I4892 Unspecified atrial flutter: Secondary | ICD-10-CM | POA: Diagnosis not present

## 2022-10-21 LAB — ECHOCARDIOGRAM COMPLETE
Area-P 1/2: 2.66 cm2
P 1/2 time: 476 msec
S' Lateral: 3.5 cm

## 2022-10-26 ENCOUNTER — Telehealth: Payer: Self-pay | Admitting: Physician Assistant

## 2022-10-26 ENCOUNTER — Other Ambulatory Visit: Payer: Self-pay

## 2022-10-26 DIAGNOSIS — I872 Venous insufficiency (chronic) (peripheral): Secondary | ICD-10-CM

## 2022-10-26 DIAGNOSIS — H59812 Chorioretinal scars after surgery for detachment, left eye: Secondary | ICD-10-CM | POA: Diagnosis not present

## 2022-10-26 DIAGNOSIS — Z961 Presence of intraocular lens: Secondary | ICD-10-CM | POA: Diagnosis not present

## 2022-10-26 DIAGNOSIS — H353131 Nonexudative age-related macular degeneration, bilateral, early dry stage: Secondary | ICD-10-CM | POA: Diagnosis not present

## 2022-10-26 DIAGNOSIS — I10 Essential (primary) hypertension: Secondary | ICD-10-CM

## 2022-10-26 DIAGNOSIS — D3131 Benign neoplasm of right choroid: Secondary | ICD-10-CM | POA: Diagnosis not present

## 2022-10-26 NOTE — Telephone Encounter (Signed)
Pt would like a callback regarding his ECHO results. Please advise

## 2022-10-28 DIAGNOSIS — I5030 Unspecified diastolic (congestive) heart failure: Secondary | ICD-10-CM | POA: Diagnosis not present

## 2022-10-28 DIAGNOSIS — I872 Venous insufficiency (chronic) (peripheral): Secondary | ICD-10-CM | POA: Diagnosis not present

## 2022-10-28 DIAGNOSIS — E1140 Type 2 diabetes mellitus with diabetic neuropathy, unspecified: Secondary | ICD-10-CM | POA: Diagnosis not present

## 2022-10-28 DIAGNOSIS — Z8546 Personal history of malignant neoplasm of prostate: Secondary | ICD-10-CM | POA: Diagnosis not present

## 2022-10-28 DIAGNOSIS — C439 Malignant melanoma of skin, unspecified: Secondary | ICD-10-CM | POA: Diagnosis not present

## 2022-10-28 DIAGNOSIS — E039 Hypothyroidism, unspecified: Secondary | ICD-10-CM | POA: Diagnosis not present

## 2022-10-28 DIAGNOSIS — N400 Enlarged prostate without lower urinary tract symptoms: Secondary | ICD-10-CM | POA: Diagnosis not present

## 2022-10-28 DIAGNOSIS — E7800 Pure hypercholesterolemia, unspecified: Secondary | ICD-10-CM | POA: Diagnosis not present

## 2022-10-28 DIAGNOSIS — M519 Unspecified thoracic, thoracolumbar and lumbosacral intervertebral disc disorder: Secondary | ICD-10-CM | POA: Diagnosis not present

## 2022-10-28 DIAGNOSIS — G4733 Obstructive sleep apnea (adult) (pediatric): Secondary | ICD-10-CM | POA: Diagnosis not present

## 2022-10-28 DIAGNOSIS — D696 Thrombocytopenia, unspecified: Secondary | ICD-10-CM | POA: Diagnosis not present

## 2022-10-28 DIAGNOSIS — I4892 Unspecified atrial flutter: Secondary | ICD-10-CM | POA: Diagnosis not present

## 2022-11-25 DIAGNOSIS — H11122 Conjunctival concretions, left eye: Secondary | ICD-10-CM | POA: Diagnosis not present

## 2022-12-14 DIAGNOSIS — C61 Malignant neoplasm of prostate: Secondary | ICD-10-CM | POA: Diagnosis not present

## 2022-12-21 DIAGNOSIS — M25561 Pain in right knee: Secondary | ICD-10-CM | POA: Diagnosis not present

## 2022-12-22 DIAGNOSIS — C61 Malignant neoplasm of prostate: Secondary | ICD-10-CM | POA: Diagnosis not present

## 2023-01-22 DIAGNOSIS — M1711 Unilateral primary osteoarthritis, right knee: Secondary | ICD-10-CM | POA: Diagnosis not present

## 2023-02-25 DIAGNOSIS — L814 Other melanin hyperpigmentation: Secondary | ICD-10-CM | POA: Diagnosis not present

## 2023-02-25 DIAGNOSIS — D485 Neoplasm of uncertain behavior of skin: Secondary | ICD-10-CM | POA: Diagnosis not present

## 2023-02-25 DIAGNOSIS — L821 Other seborrheic keratosis: Secondary | ICD-10-CM | POA: Diagnosis not present

## 2023-02-25 DIAGNOSIS — D692 Other nonthrombocytopenic purpura: Secondary | ICD-10-CM | POA: Diagnosis not present

## 2023-02-25 DIAGNOSIS — L570 Actinic keratosis: Secondary | ICD-10-CM | POA: Diagnosis not present

## 2023-04-23 DIAGNOSIS — Z23 Encounter for immunization: Secondary | ICD-10-CM | POA: Diagnosis not present

## 2023-04-27 DIAGNOSIS — S99922A Unspecified injury of left foot, initial encounter: Secondary | ICD-10-CM | POA: Diagnosis not present

## 2023-04-28 DIAGNOSIS — E119 Type 2 diabetes mellitus without complications: Secondary | ICD-10-CM | POA: Diagnosis not present

## 2023-04-28 DIAGNOSIS — H353131 Nonexudative age-related macular degeneration, bilateral, early dry stage: Secondary | ICD-10-CM | POA: Diagnosis not present

## 2023-04-28 DIAGNOSIS — D3131 Benign neoplasm of right choroid: Secondary | ICD-10-CM | POA: Diagnosis not present

## 2023-04-30 DIAGNOSIS — E7800 Pure hypercholesterolemia, unspecified: Secondary | ICD-10-CM | POA: Diagnosis not present

## 2023-04-30 DIAGNOSIS — K219 Gastro-esophageal reflux disease without esophagitis: Secondary | ICD-10-CM | POA: Diagnosis not present

## 2023-04-30 DIAGNOSIS — E039 Hypothyroidism, unspecified: Secondary | ICD-10-CM | POA: Diagnosis not present

## 2023-04-30 DIAGNOSIS — I4892 Unspecified atrial flutter: Secondary | ICD-10-CM | POA: Diagnosis not present

## 2023-04-30 DIAGNOSIS — I872 Venous insufficiency (chronic) (peripheral): Secondary | ICD-10-CM | POA: Diagnosis not present

## 2023-04-30 DIAGNOSIS — Z0000 Encounter for general adult medical examination without abnormal findings: Secondary | ICD-10-CM | POA: Diagnosis not present

## 2023-04-30 DIAGNOSIS — I5030 Unspecified diastolic (congestive) heart failure: Secondary | ICD-10-CM | POA: Diagnosis not present

## 2023-04-30 DIAGNOSIS — E11621 Type 2 diabetes mellitus with foot ulcer: Secondary | ICD-10-CM | POA: Diagnosis not present

## 2023-04-30 DIAGNOSIS — N400 Enlarged prostate without lower urinary tract symptoms: Secondary | ICD-10-CM | POA: Diagnosis not present

## 2023-04-30 DIAGNOSIS — Z23 Encounter for immunization: Secondary | ICD-10-CM | POA: Diagnosis not present

## 2023-04-30 DIAGNOSIS — S99922D Unspecified injury of left foot, subsequent encounter: Secondary | ICD-10-CM | POA: Diagnosis not present

## 2023-04-30 DIAGNOSIS — E1140 Type 2 diabetes mellitus with diabetic neuropathy, unspecified: Secondary | ICD-10-CM | POA: Diagnosis not present

## 2023-04-30 DIAGNOSIS — Z1331 Encounter for screening for depression: Secondary | ICD-10-CM | POA: Diagnosis not present

## 2023-04-30 DIAGNOSIS — D696 Thrombocytopenia, unspecified: Secondary | ICD-10-CM | POA: Diagnosis not present

## 2023-04-30 DIAGNOSIS — C439 Malignant melanoma of skin, unspecified: Secondary | ICD-10-CM | POA: Diagnosis not present

## 2023-05-18 ENCOUNTER — Other Ambulatory Visit: Payer: Self-pay | Admitting: Internal Medicine

## 2023-08-03 DIAGNOSIS — R053 Chronic cough: Secondary | ICD-10-CM | POA: Diagnosis not present

## 2023-08-03 DIAGNOSIS — R531 Weakness: Secondary | ICD-10-CM | POA: Diagnosis not present

## 2023-08-03 DIAGNOSIS — R410 Disorientation, unspecified: Secondary | ICD-10-CM | POA: Diagnosis not present

## 2023-08-03 DIAGNOSIS — B349 Viral infection, unspecified: Secondary | ICD-10-CM | POA: Diagnosis not present

## 2023-08-18 DIAGNOSIS — D696 Thrombocytopenia, unspecified: Secondary | ICD-10-CM | POA: Diagnosis not present

## 2023-09-01 DIAGNOSIS — L821 Other seborrheic keratosis: Secondary | ICD-10-CM | POA: Diagnosis not present

## 2023-09-01 DIAGNOSIS — Z85820 Personal history of malignant melanoma of skin: Secondary | ICD-10-CM | POA: Diagnosis not present

## 2023-09-01 DIAGNOSIS — L570 Actinic keratosis: Secondary | ICD-10-CM | POA: Diagnosis not present

## 2023-09-01 DIAGNOSIS — D0472 Carcinoma in situ of skin of left lower limb, including hip: Secondary | ICD-10-CM | POA: Diagnosis not present

## 2023-09-01 DIAGNOSIS — L814 Other melanin hyperpigmentation: Secondary | ICD-10-CM | POA: Diagnosis not present

## 2023-09-06 ENCOUNTER — Encounter: Payer: Self-pay | Admitting: Internal Medicine

## 2023-09-06 ENCOUNTER — Ambulatory Visit: Payer: Medicare Other | Attending: Internal Medicine | Admitting: Internal Medicine

## 2023-09-06 VITALS — BP 132/74 | HR 74 | Ht 78.0 in | Wt 277.0 lb

## 2023-09-06 DIAGNOSIS — I4892 Unspecified atrial flutter: Secondary | ICD-10-CM | POA: Insufficient documentation

## 2023-09-06 NOTE — Patient Instructions (Signed)
 Medication Instructions:  Your physician recommends that you continue on your current medications as directed. Please refer to the Current Medication list given to you today.  *If you need a refill on your cardiac medications before your next appointment, please call your pharmacy*  Lab Work: None ordered.  You may go to any [facility] Location for your lab work:  [facility] - [address] ([facility]) - [address] [phone] [address]  [place] - [address]   [place]  - [address]   [place] - [address] - [address] [employer]  - [address] [place] - [address] ([facility]   If you have labs (blood work) drawn today and your tests are completely normal, you will receive your results only by: [employer] (if you have MyChart)  If you have any lab test that is abnormal or we need to change your treatment, we will call you or send a MyChart message to review the results.  Testing/Procedures: None ordered.  Follow-Up: At [facility], you and your health needs are our priority.  As part of our continuing mission to provide you with exceptional heart care, we have created designated Provider Care Teams.  These Care Teams include your primary Cardiologist (physician) and Advanced Practice Providers (APPs -  Physician Assistants and Nurse Practitioners) who all work together to provide you with the care you need, when you need it.  Your next appointment:   1 year(s)  The format for your next appointment:   In Person  Provider:   Lewayne Bunting, MD{or one of the following Advanced Practice Providers on your designated Care Team:   Francis Dowse, [place] Casimiro Needle "Mardelle Matte" [place], [place] Earnest Rosier, NP  Note: Remote monitoring is used to monitor your Pacemaker/ ICD from home. This monitoring reduces the number of office visits required to check your  device to one time per year. It allows [place] to keep an eye on the functioning of your device to ensure it is working properly.            Valet parking services will be available as well.

## 2023-09-06 NOTE — Progress Notes (Signed)
 HPI Matthew Robinson returns today for followup. He is a pleasant 78 yo man with a h/o atrial flutter s/p catheter ablation. He has done well in the interim. No palpitations. He has prostate CA and has undergone XRT. He has undergone hormonal therapy. He still walks some but gets tired. No syncope. Since I saw him last over 2 years ago he notes he has chronic swelling in his legs and wears support stockings.  No Known Allergies   Current Outpatient Medications  Medication Sig Dispense Refill   amoxicillin (AMOXIL) 500 MG capsule Take 2,000 mg by mouth once.     Cholecalciferol (VITAMIN D) 2000 UNITS tablet Take 2,000 Units by mouth 3 (three) times a week.     diphenoxylate-atropine (LOMOTIL) 2.5-0.025 MG tablet Take 1-2 tablets by mouth 4 (four) times daily as needed for diarrhea or loose stools. 40 tablet 5   famotidine (PEPCID) 20 MG tablet Take 20 mg by mouth daily as needed for heartburn.      furosemide (LASIX) 40 MG tablet Take 40 mg by mouth every 3 (three) days.     ibuprofen (ADVIL) 200 MG tablet Take 400 mg by mouth in the morning.     Leuprolide Acetate (ELIGARD ) Inject 1 application. into the skin every 6 (six) months.     levothyroxine (SYNTHROID, LEVOTHROID) 50 MCG tablet Take 50 mcg by mouth every other day. Alternates with 75 mcg strength at 0400     levothyroxine (SYNTHROID, LEVOTHROID) 75 MCG tablet Take 75 mcg by mouth every other day. Alternates with 50 mcg strength at 0400 or 0500     metFORMIN (GLUCOPHAGE) 500 MG tablet Take 500 mg by mouth at bedtime.   2   methocarbamol (ROBAXIN) 500 MG tablet Take 1 tablet (500 mg total) by mouth every 6 (six) hours as needed for muscle spasms. 30 tablet 2   methylPREDNISolone (MEDROL DOSEPAK) 4 MG TBPK tablet Take as directed 21 each 0   metoprolol succinate (TOPROL-XL) 25 MG 24 hr tablet TAKE 1 TABLET BY MOUTH ONCE DAILY WITH MEAL OR IMMEDIATELY FOLLOWING A MEAL 90 tablet 3   Multiple Vitamins-Minerals (PRESERVISION AREDS 2 PO)  Take 1 tablet by mouth at bedtime.     mupirocin ointment (BACTROBAN) 2 % Apply 1 application topically 2 (two) times daily. (Patient taking differently: Apply 1 application  topically daily as needed (Skin infection).) 30 g 2   predniSONE (DELTASONE) 10 MG tablet Take 10 mg by mouth daily as needed (When only on vacation at 0400).     simvastatin (ZOCOR) 40 MG tablet Take 40 mg by mouth every other day.     torsemide (DEMADEX) 20 MG tablet Take 20 mg by mouth daily as needed.     No current facility-administered medications for this visit.     Past Medical History:  Diagnosis Date   Chronic right-sided CHF (congestive heart failure) (HCC)    Echo 11/17: EF 55 and 60, normal wall motion, trivial AI, moderate RVE, mildly reduced RVSF, moderate to severe RAE // Echo 6/18: EF 60-65, Gr 1 DD, trivial AI, mild RVE, mild RAE.   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 MOHS x 3.  Mild aortic insufficiency    Mitral valve prolapse    hx of - seen years ago by Dr Landry Mellow in [facility], [place] - not seen on recent echoes   Prostate cancer [facility])    Sleep apnea    cpap 16    ROS:   All systems reviewed and negative except as noted in the HPI.   Past Surgical History:  Procedure Laterality Date   A-FLUTTER ABLATION N/A 12/14/2019   Procedure: A-FLUTTER ABLATION;  Surgeon: Marinus Maw, MD;  [facility] INVAS[facility] CV LAB;  Service: Cardiovascular;  Laterality: N/A;   CATARACT EXTRACTION Left    COLONOSCOPY     polyps in past   COLONOSCOPY WITH PROPOFOL N/A 08/14/2014   Procedure: COLONOSCOPY WITH PROPOFOL;  Surgeon: Charolett Bumpers, MD;  Location: [facility] ENDOSCOPY;  Service: Endoscopy;  Laterality: N/A;   MELANOMA EXCISION      MENISCUS REPAIR Left 1970   RETINAL DETACHMENT SURGERY     RIGHT HEART CATH N/A 05/26/2018   Procedure: RIGHT HEART CATH;  Surgeon: Laurey Morale, MD;  Location: [facility] INVASIVE CV LAB;  Service: Cardiovascular;  Laterality: N/A;   SPINE SURGERY     4'14 lumbar 3,4,5 fusion([place])   TONSILLECTOMY       Family History  Problem Relation Age of Onset   Heart failure Mother    Heart disease Mother    Atrial fibrillation Mother    Mitral valve prolapse Mother    Breast cancer Mother    Hypertension Father    Heart attack Father        died from MI during CA surgery   Cancer Father    Lymphoma Father    Cancer Maternal Grandfather    Prostate cancer Neg Hx    Colon cancer Neg Hx    Pancreatic cancer Neg Hx      Social History   Socioeconomic History   Marital status: Married    Spouse name: Not on file   Number of children: Not on file   Years of education: Not on file   Highest education level: Not on file  Occupational History   Not on file  Tobacco Use   Smoking status: Never   Smokeless tobacco: Never  Vaping Use   Vaping status: Never Used  Substance and Sexual Activity   Alcohol use: Yes    Alcohol/week: 1.0 - 3.0 standard drink of alcohol    Types: 1 - 3 Glasses of wine per week    Comment: x2 weekly    Drug use: No   Sexual activity: Yes  Other Topics Concern   Not on file  Social History Narrative   Married   1 daughter - deceased   Teacher, 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; works a little   Social Drivers of Corporate investment banker Strain: Not on Ship broker Insecurity: Not on file  Transportation Needs: Not on file  Physical Activity: Not on file  Stress: Not on file  Social Connections: Not on file  Intimate Partner Violence: Not on file     BP 132/74   Pulse 74   Ht 6\' 6"  (1.981 m)   Wt 125.6 kg   SpO2 98%   BMI 32.01 kg/m   Physical Exam:  Well appearing NAD HEENT: Unremarkable Neck:  No JVD, no thyromegally Lymphatics:  No  adenopathy Back:  No CVA tenderness Lungs:  Clear with no wheezes HEART:  Regular rate rhythm, no murmurs, no rubs, no clicks Abd:  soft, positive bowel sounds, no organomegally, no rebound, no guarding Ext:  2 plus pulses, 1+ edema, no cyanosis, no clubbing Skin:  No rashes no nodules Neuro:  CN II through XII intact, motor grossly intact  EKG - NSR  DEVICE  Normal device function.  See PaceArt for details.   Assess/Plan:  Atrial flutter - he is s/p ablation and has not had any additional evidence of atrial flutter. He will undergo watchful waiting HTN - his bp is well controlled.  Peripheral edema - he will maintain a low sodium diet.    Sharlot Gowda Carol Theys,MD

## 2023-10-08 DIAGNOSIS — R197 Diarrhea, unspecified: Secondary | ICD-10-CM | POA: Diagnosis not present

## 2023-10-08 DIAGNOSIS — M519 Unspecified thoracic, thoracolumbar and lumbosacral intervertebral disc disorder: Secondary | ICD-10-CM | POA: Diagnosis not present

## 2023-10-28 DIAGNOSIS — R972 Elevated prostate specific antigen [PSA]: Secondary | ICD-10-CM | POA: Diagnosis not present

## 2023-10-28 DIAGNOSIS — C439 Malignant melanoma of skin, unspecified: Secondary | ICD-10-CM | POA: Diagnosis not present

## 2023-10-28 DIAGNOSIS — I4892 Unspecified atrial flutter: Secondary | ICD-10-CM | POA: Diagnosis not present

## 2023-10-28 DIAGNOSIS — M519 Unspecified thoracic, thoracolumbar and lumbosacral intervertebral disc disorder: Secondary | ICD-10-CM | POA: Diagnosis not present

## 2023-10-28 DIAGNOSIS — R197 Diarrhea, unspecified: Secondary | ICD-10-CM | POA: Diagnosis not present

## 2023-10-28 DIAGNOSIS — Z8546 Personal history of malignant neoplasm of prostate: Secondary | ICD-10-CM | POA: Diagnosis not present

## 2023-10-28 DIAGNOSIS — I5030 Unspecified diastolic (congestive) heart failure: Secondary | ICD-10-CM | POA: Diagnosis not present

## 2023-10-28 DIAGNOSIS — E039 Hypothyroidism, unspecified: Secondary | ICD-10-CM | POA: Diagnosis not present

## 2023-10-28 DIAGNOSIS — E1140 Type 2 diabetes mellitus with diabetic neuropathy, unspecified: Secondary | ICD-10-CM | POA: Diagnosis not present

## 2023-10-28 DIAGNOSIS — D696 Thrombocytopenia, unspecified: Secondary | ICD-10-CM | POA: Diagnosis not present

## 2023-10-28 DIAGNOSIS — R413 Other amnesia: Secondary | ICD-10-CM | POA: Diagnosis not present

## 2023-10-28 DIAGNOSIS — K219 Gastro-esophageal reflux disease without esophagitis: Secondary | ICD-10-CM | POA: Diagnosis not present

## 2023-11-01 DIAGNOSIS — H6121 Impacted cerumen, right ear: Secondary | ICD-10-CM | POA: Diagnosis not present

## 2023-11-01 DIAGNOSIS — H9042 Sensorineural hearing loss, unilateral, left ear, with unrestricted hearing on the contralateral side: Secondary | ICD-10-CM | POA: Diagnosis not present

## 2023-11-08 DIAGNOSIS — H59812 Chorioretinal scars after surgery for detachment, left eye: Secondary | ICD-10-CM | POA: Diagnosis not present

## 2023-11-08 DIAGNOSIS — H353131 Nonexudative age-related macular degeneration, bilateral, early dry stage: Secondary | ICD-10-CM | POA: Diagnosis not present

## 2023-11-08 DIAGNOSIS — E119 Type 2 diabetes mellitus without complications: Secondary | ICD-10-CM | POA: Diagnosis not present

## 2023-11-08 DIAGNOSIS — D3131 Benign neoplasm of right choroid: Secondary | ICD-10-CM | POA: Diagnosis not present

## 2023-11-09 DIAGNOSIS — Z03818 Encounter for observation for suspected exposure to other biological agents ruled out: Secondary | ICD-10-CM | POA: Diagnosis not present

## 2023-11-09 DIAGNOSIS — R0989 Other specified symptoms and signs involving the circulatory and respiratory systems: Secondary | ICD-10-CM | POA: Diagnosis not present

## 2023-11-12 DIAGNOSIS — I509 Heart failure, unspecified: Secondary | ICD-10-CM | POA: Diagnosis not present

## 2023-11-12 DIAGNOSIS — H109 Unspecified conjunctivitis: Secondary | ICD-10-CM | POA: Diagnosis not present

## 2023-11-12 DIAGNOSIS — J069 Acute upper respiratory infection, unspecified: Secondary | ICD-10-CM | POA: Diagnosis not present

## 2024-01-10 DIAGNOSIS — C61 Malignant neoplasm of prostate: Secondary | ICD-10-CM | POA: Diagnosis not present

## 2024-01-17 DIAGNOSIS — C61 Malignant neoplasm of prostate: Secondary | ICD-10-CM | POA: Diagnosis not present

## 2024-02-16 DIAGNOSIS — B078 Other viral warts: Secondary | ICD-10-CM | POA: Diagnosis not present

## 2024-02-16 DIAGNOSIS — L570 Actinic keratosis: Secondary | ICD-10-CM | POA: Diagnosis not present

## 2024-02-16 DIAGNOSIS — L821 Other seborrheic keratosis: Secondary | ICD-10-CM | POA: Diagnosis not present

## 2024-02-16 DIAGNOSIS — L814 Other melanin hyperpigmentation: Secondary | ICD-10-CM | POA: Diagnosis not present

## 2024-02-16 DIAGNOSIS — Z85828 Personal history of other malignant neoplasm of skin: Secondary | ICD-10-CM | POA: Diagnosis not present

## 2024-02-16 DIAGNOSIS — Z85820 Personal history of malignant melanoma of skin: Secondary | ICD-10-CM | POA: Diagnosis not present

## 2024-04-06 DIAGNOSIS — Z23 Encounter for immunization: Secondary | ICD-10-CM | POA: Diagnosis not present

## 2024-04-18 DIAGNOSIS — M25522 Pain in left elbow: Secondary | ICD-10-CM | POA: Diagnosis not present

## 2024-04-18 DIAGNOSIS — M19022 Primary osteoarthritis, left elbow: Secondary | ICD-10-CM | POA: Diagnosis not present

## 2024-04-18 DIAGNOSIS — M7022 Olecranon bursitis, left elbow: Secondary | ICD-10-CM | POA: Diagnosis not present

## 2024-05-03 ENCOUNTER — Other Ambulatory Visit: Payer: Self-pay | Admitting: Internal Medicine

## 2024-05-03 DIAGNOSIS — N400 Enlarged prostate without lower urinary tract symptoms: Secondary | ICD-10-CM | POA: Diagnosis not present

## 2024-05-03 DIAGNOSIS — E1140 Type 2 diabetes mellitus with diabetic neuropathy, unspecified: Secondary | ICD-10-CM | POA: Diagnosis not present

## 2024-05-03 DIAGNOSIS — I4892 Unspecified atrial flutter: Secondary | ICD-10-CM | POA: Diagnosis not present

## 2024-05-03 DIAGNOSIS — R413 Other amnesia: Secondary | ICD-10-CM

## 2024-05-03 DIAGNOSIS — I872 Venous insufficiency (chronic) (peripheral): Secondary | ICD-10-CM | POA: Diagnosis not present

## 2024-05-03 DIAGNOSIS — I5030 Unspecified diastolic (congestive) heart failure: Secondary | ICD-10-CM | POA: Diagnosis not present

## 2024-05-03 DIAGNOSIS — D696 Thrombocytopenia, unspecified: Secondary | ICD-10-CM | POA: Diagnosis not present

## 2024-05-03 DIAGNOSIS — Z23 Encounter for immunization: Secondary | ICD-10-CM | POA: Diagnosis not present

## 2024-05-03 DIAGNOSIS — G4733 Obstructive sleep apnea (adult) (pediatric): Secondary | ICD-10-CM | POA: Diagnosis not present

## 2024-05-03 DIAGNOSIS — E039 Hypothyroidism, unspecified: Secondary | ICD-10-CM | POA: Diagnosis not present

## 2024-05-03 DIAGNOSIS — E7800 Pure hypercholesterolemia, unspecified: Secondary | ICD-10-CM | POA: Diagnosis not present

## 2024-05-03 DIAGNOSIS — Z0000 Encounter for general adult medical examination without abnormal findings: Secondary | ICD-10-CM | POA: Diagnosis not present

## 2024-05-03 DIAGNOSIS — Z8546 Personal history of malignant neoplasm of prostate: Secondary | ICD-10-CM | POA: Diagnosis not present

## 2024-05-03 DIAGNOSIS — R269 Unspecified abnormalities of gait and mobility: Secondary | ICD-10-CM | POA: Diagnosis not present

## 2024-05-16 DIAGNOSIS — M7022 Olecranon bursitis, left elbow: Secondary | ICD-10-CM | POA: Diagnosis not present

## 2024-05-16 DIAGNOSIS — M19022 Primary osteoarthritis, left elbow: Secondary | ICD-10-CM | POA: Diagnosis not present

## 2024-05-19 DIAGNOSIS — E119 Type 2 diabetes mellitus without complications: Secondary | ICD-10-CM | POA: Diagnosis not present

## 2024-05-19 DIAGNOSIS — D3131 Benign neoplasm of right choroid: Secondary | ICD-10-CM | POA: Diagnosis not present

## 2024-05-19 DIAGNOSIS — H353131 Nonexudative age-related macular degeneration, bilateral, early dry stage: Secondary | ICD-10-CM | POA: Diagnosis not present

## 2024-05-19 DIAGNOSIS — H59812 Chorioretinal scars after surgery for detachment, left eye: Secondary | ICD-10-CM | POA: Diagnosis not present

## 2024-06-04 ENCOUNTER — Other Ambulatory Visit: Payer: Self-pay | Admitting: Internal Medicine

## 2024-06-04 ENCOUNTER — Ambulatory Visit
Admission: RE | Admit: 2024-06-04 | Discharge: 2024-06-04 | Disposition: A | Source: Ambulatory Visit | Attending: Internal Medicine | Admitting: Internal Medicine

## 2024-06-04 DIAGNOSIS — R413 Other amnesia: Secondary | ICD-10-CM

## 2024-06-04 MED ORDER — GADOPICLENOL 0.5 MMOL/ML IV SOLN
10.0000 mL | Freq: Once | INTRAVENOUS | Status: AC | PRN
  Administered 2024-06-04: 10 mL via INTRAVENOUS
